# Patient Record
Sex: Male | Born: 1935 | Race: White | Hispanic: No | Marital: Married | Smoking: Never smoker
Health system: Southern US, Community
[De-identification: ages and names within clinical notes are randomized; demographics above are authoritative.]

## PROBLEM LIST (undated history)

## (undated) DIAGNOSIS — L570 Actinic keratosis: Secondary | ICD-10-CM

## (undated) DIAGNOSIS — I714 Abdominal aortic aneurysm, without rupture, unspecified: Secondary | ICD-10-CM

## (undated) DIAGNOSIS — R1310 Dysphagia, unspecified: Secondary | ICD-10-CM

## (undated) DIAGNOSIS — F419 Anxiety disorder, unspecified: Secondary | ICD-10-CM

## (undated) DIAGNOSIS — I639 Cerebral infarction, unspecified: Secondary | ICD-10-CM

## (undated) DIAGNOSIS — E785 Hyperlipidemia, unspecified: Secondary | ICD-10-CM

## (undated) DIAGNOSIS — I4891 Unspecified atrial fibrillation: Secondary | ICD-10-CM

## (undated) DIAGNOSIS — C801 Malignant (primary) neoplasm, unspecified: Secondary | ICD-10-CM

## (undated) DIAGNOSIS — K5790 Diverticulosis of intestine, part unspecified, without perforation or abscess without bleeding: Secondary | ICD-10-CM

## (undated) DIAGNOSIS — Z860100 Personal history of colon polyps, unspecified: Secondary | ICD-10-CM

## (undated) DIAGNOSIS — I499 Cardiac arrhythmia, unspecified: Secondary | ICD-10-CM

## (undated) DIAGNOSIS — I2510 Atherosclerotic heart disease of native coronary artery without angina pectoris: Secondary | ICD-10-CM

## (undated) HISTORY — PX: ODONTOID FRACTURE SURGERY: SHX724

## (undated) HISTORY — DX: Abdominal aortic aneurysm, without rupture: I71.4

## (undated) HISTORY — DX: Personal history of colonic polyps: Z86.010

## (undated) HISTORY — DX: Actinic keratosis: L57.0

## (undated) HISTORY — PX: TONSILLECTOMY: SUR1361

## (undated) HISTORY — DX: Atherosclerotic heart disease of native coronary artery without angina pectoris: I25.10

## (undated) HISTORY — PX: EYE SURGERY: SHX253

## (undated) HISTORY — DX: Abdominal aortic aneurysm, without rupture, unspecified: I71.40

## (undated) HISTORY — DX: Personal history of colon polyps, unspecified: Z86.0100

## (undated) HISTORY — DX: Diverticulosis of intestine, part unspecified, without perforation or abscess without bleeding: K57.90

## (undated) HISTORY — DX: Hyperlipidemia, unspecified: E78.5

## (undated) HISTORY — PX: PROSTATE SURGERY: SHX751

## (undated) HISTORY — DX: Malignant (primary) neoplasm, unspecified: C80.1

---

## 1982-08-23 DIAGNOSIS — I2510 Atherosclerotic heart disease of native coronary artery without angina pectoris: Secondary | ICD-10-CM

## 1982-08-23 HISTORY — PX: CORONARY ARTERY BYPASS GRAFT: SHX141

## 1982-08-23 HISTORY — DX: Atherosclerotic heart disease of native coronary artery without angina pectoris: I25.10

## 1997-08-23 DIAGNOSIS — C801 Malignant (primary) neoplasm, unspecified: Secondary | ICD-10-CM | POA: Insufficient documentation

## 1997-08-23 HISTORY — DX: Malignant (primary) neoplasm, unspecified: C80.1

## 1997-12-13 ENCOUNTER — Encounter: Admission: RE | Admit: 1997-12-13 | Discharge: 1998-03-13 | Payer: Self-pay | Admitting: Radiation Oncology

## 1998-01-29 ENCOUNTER — Ambulatory Visit (HOSPITAL_COMMUNITY): Admission: RE | Admit: 1998-01-29 | Discharge: 1998-01-30 | Payer: Self-pay | Admitting: Urology

## 1998-01-29 HISTORY — PX: OTHER SURGICAL HISTORY: SHX169

## 1998-02-07 ENCOUNTER — Ambulatory Visit (HOSPITAL_COMMUNITY): Admission: RE | Admit: 1998-02-07 | Discharge: 1998-02-07 | Payer: Self-pay | Admitting: Cardiology

## 1999-05-05 ENCOUNTER — Ambulatory Visit (HOSPITAL_COMMUNITY): Admission: RE | Admit: 1999-05-05 | Discharge: 1999-05-05 | Payer: Self-pay | Admitting: Gastroenterology

## 1999-06-24 ENCOUNTER — Ambulatory Visit (HOSPITAL_COMMUNITY): Admission: RE | Admit: 1999-06-24 | Discharge: 1999-06-24 | Payer: Self-pay | Admitting: Gastroenterology

## 2006-05-09 ENCOUNTER — Encounter: Admission: RE | Admit: 2006-05-09 | Discharge: 2006-05-09 | Payer: Self-pay | Admitting: Cardiology

## 2007-01-23 ENCOUNTER — Ambulatory Visit: Payer: Self-pay | Admitting: Family Medicine

## 2007-03-15 ENCOUNTER — Ambulatory Visit: Payer: Self-pay | Admitting: Family Medicine

## 2007-07-25 ENCOUNTER — Ambulatory Visit: Payer: Self-pay | Admitting: Family Medicine

## 2007-11-17 ENCOUNTER — Ambulatory Visit: Payer: Self-pay | Admitting: Family Medicine

## 2007-12-04 ENCOUNTER — Ambulatory Visit: Payer: Self-pay | Admitting: Family Medicine

## 2007-12-11 ENCOUNTER — Ambulatory Visit (HOSPITAL_COMMUNITY): Admission: RE | Admit: 2007-12-11 | Discharge: 2007-12-11 | Payer: Self-pay | Admitting: Urology

## 2008-04-21 ENCOUNTER — Emergency Department (HOSPITAL_BASED_OUTPATIENT_CLINIC_OR_DEPARTMENT_OTHER): Admission: EM | Admit: 2008-04-21 | Discharge: 2008-04-21 | Payer: Self-pay | Admitting: Emergency Medicine

## 2008-04-22 ENCOUNTER — Ambulatory Visit: Payer: Self-pay | Admitting: Family Medicine

## 2009-01-10 ENCOUNTER — Ambulatory Visit: Payer: Self-pay | Admitting: Family Medicine

## 2009-01-24 ENCOUNTER — Ambulatory Visit: Payer: Self-pay | Admitting: Family Medicine

## 2009-03-17 ENCOUNTER — Ambulatory Visit: Payer: Self-pay | Admitting: Family Medicine

## 2009-10-18 ENCOUNTER — Ambulatory Visit: Payer: Self-pay | Admitting: Diagnostic Radiology

## 2009-10-18 ENCOUNTER — Emergency Department (HOSPITAL_BASED_OUTPATIENT_CLINIC_OR_DEPARTMENT_OTHER): Admission: EM | Admit: 2009-10-18 | Discharge: 2009-10-18 | Payer: Self-pay | Admitting: Emergency Medicine

## 2009-10-30 ENCOUNTER — Inpatient Hospital Stay (HOSPITAL_COMMUNITY): Admission: RE | Admit: 2009-10-30 | Discharge: 2009-10-31 | Payer: Self-pay | Admitting: Neurosurgery

## 2009-12-27 ENCOUNTER — Emergency Department (HOSPITAL_BASED_OUTPATIENT_CLINIC_OR_DEPARTMENT_OTHER): Admission: EM | Admit: 2009-12-27 | Discharge: 2009-12-27 | Payer: Self-pay | Admitting: Emergency Medicine

## 2010-02-09 ENCOUNTER — Encounter: Admission: RE | Admit: 2010-02-09 | Discharge: 2010-02-09 | Payer: Self-pay | Admitting: Neurosurgery

## 2010-05-11 ENCOUNTER — Ambulatory Visit (HOSPITAL_COMMUNITY): Admission: RE | Admit: 2010-05-11 | Discharge: 2010-05-11 | Payer: Self-pay | Admitting: Urology

## 2010-07-13 ENCOUNTER — Encounter: Admission: RE | Admit: 2010-07-13 | Discharge: 2010-07-13 | Payer: Self-pay | Admitting: Neurosurgery

## 2010-07-13 ENCOUNTER — Encounter: Admission: RE | Admit: 2010-07-13 | Payer: Self-pay | Admitting: Neurosurgery

## 2010-07-21 ENCOUNTER — Ambulatory Visit: Payer: Self-pay | Admitting: Family Medicine

## 2010-11-05 LAB — CBC
Hemoglobin: 14.1 g/dL (ref 13.0–17.0)
MCH: 32.5 pg (ref 26.0–34.0)
MCHC: 33.7 g/dL (ref 30.0–36.0)
Platelets: 161 10*3/uL (ref 150–400)
RDW: 14.5 % (ref 11.5–15.5)

## 2010-11-05 LAB — BASIC METABOLIC PANEL
BUN: 20 mg/dL (ref 6–23)
CO2: 29 mEq/L (ref 19–32)
Calcium: 9.2 mg/dL (ref 8.4–10.5)
Creatinine, Ser: 1.22 mg/dL (ref 0.4–1.5)
GFR calc non Af Amer: 56 mL/min — ABNORMAL LOW (ref >60)
Glucose, Bld: 99 mg/dL (ref 70–99)
Sodium: 140 mEq/L (ref 135–145)

## 2010-11-05 LAB — PROTIME-INR
INR: 1.07 (ref 0.00–1.49)
INR: 1.38 (ref 0.00–1.49)
Prothrombin Time: 14.1 seconds (ref 11.6–15.2)
Prothrombin Time: 17.2 seconds — ABNORMAL HIGH (ref 11.6–15.2)

## 2010-11-05 LAB — APTT: aPTT: 37 seconds (ref 24–37)

## 2010-11-10 LAB — DIFFERENTIAL
Basophils Absolute: 0.1 10*3/uL (ref 0.0–0.1)
Basophils Relative: 1 % (ref 0–1)
Eosinophils Absolute: 0.3 10*3/uL (ref 0.0–0.7)
Monocytes Absolute: 0.6 10*3/uL (ref 0.1–1.0)
Monocytes Relative: 9 % (ref 3–12)
Neutrophils Relative %: 67 % (ref 43–77)

## 2010-11-10 LAB — CBC
MCHC: 32.5 g/dL (ref 30.0–36.0)
MCV: 96.3 fL (ref 78.0–100.0)
RBC: 4.44 MIL/uL (ref 4.22–5.81)
RDW: 13.8 % (ref 11.5–15.5)

## 2010-11-10 LAB — BASIC METABOLIC PANEL
CO2: 26 mEq/L (ref 19–32)
Calcium: 9.2 mg/dL (ref 8.4–10.5)
Chloride: 105 mEq/L (ref 96–112)
Creatinine, Ser: 1 mg/dL (ref 0.4–1.5)
GFR calc Af Amer: >60 mL/min (ref >60)
Glucose, Bld: 101 mg/dL — ABNORMAL HIGH (ref 70–99)

## 2010-11-10 LAB — PROTIME-INR: INR: 1.06 (ref 0.00–1.49)

## 2010-11-10 LAB — POCT CARDIAC MARKERS: Troponin i, poc: <0.05 ng/mL (ref 0.00–0.09)

## 2010-11-11 LAB — CBC
HCT: 43.4 % (ref 39.0–52.0)
Hemoglobin: 14.4 g/dL (ref 13.0–17.0)
MCV: 97.1 fL (ref 78.0–100.0)
WBC: 6.2 10*3/uL (ref 4.0–10.5)

## 2010-11-11 LAB — DIFFERENTIAL
Eosinophils Relative: 3 % (ref 0–5)
Lymphocytes Relative: 15 % (ref 12–46)
Lymphs Abs: 0.9 10*3/uL (ref 0.7–4.0)
Monocytes Absolute: 0.5 10*3/uL (ref 0.1–1.0)

## 2010-11-11 LAB — BASIC METABOLIC PANEL
CO2: 31 mEq/L (ref 19–32)
Chloride: 104 mEq/L (ref 96–112)
GFR calc Af Amer: >60 mL/min (ref >60)
Potassium: 5.2 mEq/L — ABNORMAL HIGH (ref 3.5–5.1)

## 2010-11-11 LAB — URINALYSIS, ROUTINE W REFLEX MICROSCOPIC
Glucose, UA: NEGATIVE mg/dL
Specific Gravity, Urine: 1.015 (ref 1.005–1.030)
pH: 5.5 (ref 5.0–8.0)

## 2010-11-16 LAB — CBC
HCT: 42.2 % (ref 39.0–52.0)
MCV: 97.8 fL (ref 78.0–100.0)
RBC: 4.32 MIL/uL (ref 4.22–5.81)
WBC: 7.6 10*3/uL (ref 4.0–10.5)

## 2010-11-16 LAB — PROTIME-INR
INR: 1 (ref 0.00–1.49)
Prothrombin Time: 13.7 seconds (ref 11.6–15.2)

## 2010-11-16 LAB — URINALYSIS, ROUTINE W REFLEX MICROSCOPIC
Bilirubin Urine: NEGATIVE
Nitrite: NEGATIVE
Specific Gravity, Urine: 1.019 (ref 1.005–1.030)
Urobilinogen, UA: 0.2 mg/dL (ref 0.0–1.0)

## 2010-11-16 LAB — SURGICAL PCR SCREEN
MRSA, PCR: NEGATIVE
Staphylococcus aureus: NEGATIVE

## 2010-11-16 LAB — BASIC METABOLIC PANEL
Chloride: 105 mEq/L (ref 96–112)
Creatinine, Ser: 1.01 mg/dL (ref 0.4–1.5)
GFR calc Af Amer: >60 mL/min (ref >60)
Potassium: 4.1 mEq/L (ref 3.5–5.1)
Sodium: 138 mEq/L (ref 135–145)

## 2011-01-05 NOTE — Op Note (Signed)
Gary Valencia, Gary Valencia             ACCOUNT NO.:  [id number]   MEDICAL RECORD NO.:  [id number]          PATIENT TYPE:  AMB   LOCATION:  DAY                          FACILITY:  [facility]   PHYSICIAN:  Valetta Fuller, M.D.  DATE OF BIRTH:  10-10-1922   DATE OF PROCEDURE:  12/11/2007  DATE OF DISCHARGE:                               OPERATIVE REPORT   PREOPERATIVE DIAGNOSIS:  Bulbar urethral stricture.   POSTOPERATIVE DIAGNOSIS:  Bulbar urethral stricture.   PROCEDURE PERFORMED:  Cystoscopy with balloon dilation of stricture and  placement of Foley catheter.   SURGEON:  Valetta Fuller, M.D.   ANESTHESIA:  General.   INDICATIONS:  Mr. Sesay is an 77 year old male.  The patient was  diagnosed and treated for adenocarcinoma of the prostate about 10 years  ago.  The patient had seed implantation and his PSA has essentially been  at zero.  He has had previous problems with bulbar urethral stricture  disease which was probably iatrogenic from previous instrumentation  along with radiation.  The patient has had some endoscopic procedures in  the past.  He was doing quite well a few months ago but has noticed  increasing voiding difficulties.  Recent cystoscopy revealed a very  tight but what appeared to be short bulbar urethral stricture.  We  talked about a repeat attempt at dilation which he presents for today.   TECHNIQUE ADVISED:  The patient was brought to the operating room where  he had successful induction of general anesthesia.  Placed in lithotomy  position, prepped and draped in the usual manner.  Cystoscopy again  revealed what appeared to be a fairly tight bulbar urethral stricture.  A guidewire was placed through this area and confirmed to be in the  bladder with fluoroscopy.  We then used a nephrostomy dilating balloon  of 24-French.  This was dilated to 15 atmospheres and held in place for  approximately 7-8 minutes.  The balloon was then deflated.  The dilation  appeared to be relatively atraumatic and easily allowed [place] to visualize  with the cystoscope.  The bladder itself showed no other pathology.  Over the guidewire we placed in 18-French Councill Foley catheter and  urine drained clear.  The patient was brought to the recovery room in  stable condition.           ______________________________  Valetta Fuller, M.D.  Electronically Signed     DSG/MEDQ  D:  12/11/2007  T:  12/11/2007  Job:  [id number]

## 2011-05-18 LAB — BASIC METABOLIC PANEL
BUN: 14
CO2: 30
Chloride: 103
Glucose, Bld: 105 — ABNORMAL HIGH
Potassium: 4.5

## 2011-06-23 ENCOUNTER — Encounter: Payer: Self-pay | Admitting: Family Medicine

## 2011-10-09 ENCOUNTER — Emergency Department (HOSPITAL_COMMUNITY)
Admission: EM | Admit: 2011-10-09 | Discharge: 2011-10-09 | Disposition: A | Discharge status: Home / Self Care | Payer: PRIVATE HEALTH INSURANCE | Attending: Emergency Medicine | Admitting: Emergency Medicine

## 2011-10-09 ENCOUNTER — Encounter (HOSPITAL_COMMUNITY): Payer: Self-pay | Admitting: Emergency Medicine

## 2011-10-09 ENCOUNTER — Other Ambulatory Visit: Payer: Self-pay

## 2011-10-09 DIAGNOSIS — I4891 Unspecified atrial fibrillation: Secondary | ICD-10-CM | POA: Insufficient documentation

## 2011-10-09 DIAGNOSIS — Z79899 Other long term (current) drug therapy: Secondary | ICD-10-CM | POA: Insufficient documentation

## 2011-10-09 DIAGNOSIS — E785 Hyperlipidemia, unspecified: Secondary | ICD-10-CM | POA: Insufficient documentation

## 2011-10-09 DIAGNOSIS — R5381 Other malaise: Secondary | ICD-10-CM | POA: Insufficient documentation

## 2011-10-09 DIAGNOSIS — Z8546 Personal history of malignant neoplasm of prostate: Secondary | ICD-10-CM | POA: Insufficient documentation

## 2011-10-09 DIAGNOSIS — I2510 Atherosclerotic heart disease of native coronary artery without angina pectoris: Secondary | ICD-10-CM | POA: Insufficient documentation

## 2011-10-09 DIAGNOSIS — Z7901 Long term (current) use of anticoagulants: Secondary | ICD-10-CM | POA: Insufficient documentation

## 2011-10-09 LAB — BASIC METABOLIC PANEL
CO2: 26 mEq/L (ref 19–32)
Calcium: 9.3 mg/dL (ref 8.4–10.5)
Creatinine, Ser: 1.2 mg/dL (ref 0.50–1.35)
Glucose, Bld: 104 mg/dL — ABNORMAL HIGH (ref 70–99)

## 2011-10-09 LAB — GLUCOSE, CAPILLARY: Glucose-Capillary: 94 mg/dL (ref 70–99)

## 2011-10-09 LAB — DIFFERENTIAL
Eosinophils Relative: 5 % (ref 0–5)
Lymphocytes Relative: 26 % (ref 12–46)
Lymphs Abs: 1.7 10*3/uL (ref 0.7–4.0)
Monocytes Absolute: 0.6 10*3/uL (ref 0.1–1.0)

## 2011-10-09 LAB — CBC
HCT: 40.4 % (ref 39.0–52.0)
Hemoglobin: 13.3 g/dL (ref 13.0–17.0)
MCV: 94.4 fL (ref 78.0–100.0)
RBC: 4.28 MIL/uL (ref 4.22–5.81)
WBC: 6.6 10*3/uL (ref 4.0–10.5)

## 2011-10-09 LAB — PROTIME-INR
INR: 2.54 — ABNORMAL HIGH (ref 0.00–1.49)
Prothrombin Time: 27.8 seconds — ABNORMAL HIGH (ref 11.6–15.2)

## 2011-10-09 MED ORDER — SODIUM CHLORIDE 0.9 % IV BOLUS (SEPSIS)
500.0000 mL | Freq: Once | INTRAVENOUS | Status: AC
  Administered 2011-10-09: 08:00:00 via INTRAVENOUS

## 2011-10-09 NOTE — ED Provider Notes (Signed)
This 78 year old male has a history of chronic atrial fibrillation and switched from his research anticoagulant a few weeks ago back to Coumadin. He has had about a month of generalized weakness with occasional palpitations without lightheadedness, syncope, chest pain, shortness of breath, edema, or lateralizing weakness or numbness. He has an appointment in 2 days with his primary care doctor to follow up on his weakness. He has had several days of one to 2 nonbloody somewhat loose stools per day without fever or abdominal pain or bloody stools. His lungs are currently clear to auscultation.  ECG: Atrial fibrillation, ventricular rate 84, left axis deviation, no acute ischemic changes noted, compared with May 2011 left axis deviation is new otherwise no significant changes noted  Gary Horn, MD 10/09/11 2154

## 2011-10-09 NOTE — ED Notes (Signed)
EKG DONE BY EMT R Deivi Huckins, NEW AND OLD EKG GIVEN TO ER DR

## 2011-10-09 NOTE — ED Provider Notes (Signed)
Medical screening examination/treatment/procedure(s) were conducted as a shared visit with non-physician practitioner(s) and myself.  I personally evaluated the patient during the encounter  Gary Horn, MD 10/09/11 2154

## 2011-10-09 NOTE — ED Notes (Signed)
Report given to [name], [place]. Pt moved to CDU 2.

## 2011-10-09 NOTE — Discharge Instructions (Signed)
Gary Valencia keep your  appointment with Dr. Susann Givens on Monday.  EKG today were normal. The INR today was 2.54.  Your heart rate Today was in the 70s and 80s. Return for SOB breath, chest pain or any other concerns.   Atrial Fibrillation Atrial fibrillation is an abnormal heartbeat (rhythm). It can cause your heart rate to be faster or slower than normal, and can cause clots of blood to form in your heart. These clots can cause other health problems. Atrial fibrillation may be caused by a heart attack, lung problem, or certain medicine. Sometimes the cause of atrial fibrillation is not found. HOME CARE  Take blood thinning medicine (anticoagulants) as told by your doctor. Your doctor will need to draw your blood to check lab values if you take blood thinners.   If you had a cardioversion, limit your activity as told by your doctor.   Learn how to check your heartbeat (pulse) for an abnormal or irregular beat. Your doctor can show you how.   Ask your doctor if it is okay to exercise.   Only take medicine as told by your doctor.  GET HELP RIGHT AWAY IF:   You have trouble breathing or feel dizzy.   You have puffy (swollen) feet or ankles.   You have blood in your pee (urine) or poop (bowel movement).   You feel your heart "skipping" beats.   You feel your heart "racing" or beating fast.   You have weakness in your arms or legs.   You have trouble talking, seeing, or thinking.   You have chest pain or pain in your arm or jaw.  MAKE SURE YOU:   Understand these instructions.   Will watch your condition.   Will get help right away if you are not doing well or get worse.  Document Released: 05/18/2008 Document Revised: 04/21/2011 Document Reviewed: 11/27/2009 [facility] Patient Information 2012 [place], [place].Atrial Fibrillation Atrial fibrillation is an abnormal heartbeat (rhythm). It can cause your heart rate to be faster or slower than normal, and can cause clots of blood to  form in your heart. These clots can cause other health problems. Atrial fibrillation may be caused by a heart attack, lung problem, or certain medicine. Sometimes the cause of atrial fibrillation is not found. HOME CARE  Take blood thinning medicine (anticoagulants) as told by your doctor. Your doctor will need to draw your blood to check lab values if you take blood thinners.   If you had a cardioversion, limit your activity as told by your doctor.   Learn how to check your heartbeat (pulse) for an abnormal or irregular beat. Your doctor can show you how.   Ask your doctor if it is okay to exercise.   Only take medicine as told by your doctor.  GET HELP RIGHT AWAY IF:   You have trouble breathing or feel dizzy.   You have puffy (swollen) feet or ankles.   You have blood in your pee (urine) or poop (bowel movement).   You feel your heart "skipping" beats.   You feel your heart "racing" or beating fast.   You have weakness in your arms or legs.   You have trouble talking, seeing, or thinking.   You have chest pain or pain in your arm or jaw.  MAKE SURE YOU:   Understand these instructions.   Will watch your condition.   Will get help right away if you are not doing well or get worse.  Document Released: 05/18/2008  Document Revised: 04/21/2011 Document Reviewed: 11/27/2009 [facility] Patient Information 2012 [place], [place].

## 2011-10-09 NOTE — ED Provider Notes (Signed)
History     CSN: [id number]  Arrival date & time 10/09/11  0707   First MD Initiated Contact with Patient 10/09/11 [phone]      Chief Complaint  Patient presents with  . Irregular Heart Beat    (Consider location/radiation/quality/duration/timing/severity/associated sxs/prior treatment) Patient is a 78 y.o. male presenting with weakness. The history is provided by the patient. No language interpreter was used.  Weakness Primary symptoms do not include syncope, loss of consciousness, altered mental status, dizziness, focal weakness, fever, nausea or vomiting. The symptoms began 5 to 7 days ago.  Additional symptoms include weakness. Additional symptoms do not include pain, lower back pain, leg pain, vertigo or anxiety.  78 yo  male here today complaining of heart palpitations and weakness for 5 days. States that he went to Dr. Clarene Duke this week for the same complaint. Patient had a CABG in the past. He is in atrial for chronically and is on Coumadin. States that he was on experimental drug but they quit that and restarted his Coumadin 3 weeks ago. Denies shortness of breath or chest pain today. Heart rate presently 82 and his heart rate was 78 upon arrival EMS. Patient does look dehydrated and states that he has had diarrhea for 5 days twice a day. His wife. States that he got up this morning and went to the bathroom. When he came back to bed he was lying there and felt the palpitations come on.  Past Medical History  Diagnosis Date  . ASHD (arteriosclerotic heart disease) 1984  . Cancer 1999    PROSTATE  . Personal history of colonic polyps   . Dyslipidemia   . AAA (abdominal aortic aneurysm)   . Diverticulosis   . Actinic keratoses     History reviewed. No pertinent past surgical history.  Family History  Problem Relation Age of Onset  . Arthritis Mother   . Heart disease Father     History  Substance Use Topics  . Smoking status: Not on file  . Smokeless tobacco: Not on file   . Alcohol Use:       Review of Systems  Constitutional: Negative for fever.  Cardiovascular: Negative for syncope.  Gastrointestinal: Negative for nausea and vomiting.  Neurological: Positive for weakness. Negative for dizziness, vertigo, focal weakness and loss of consciousness.  Psychiatric/Behavioral: Negative for altered mental status.  All other systems reviewed and are negative.    Allergies  Asa buff (mag and Zetia  Home Medications   Current Outpatient Rx  Name Route Sig Dispense Refill  . ATORVASTATIN CALCIUM 40 MG PO TABS Oral Take 40 mg by mouth daily.      Marland Kitchen BENAZEPRIL HCL 10 MG PO TABS Oral Take 5 mg by mouth.      . COD LIVER OIL 1250-135 UNITS PO CAPS Oral Take by mouth.      . METOPROLOL SUCCINATE ER 50 MG PO TB24 Oral Take 50 mg by mouth daily.      . MULTI-VITAMIN/MINERALS PO TABS Oral Take 1 tablet by mouth daily.      . WARFARIN SODIUM 5 MG PO TABS Oral Take 5 mg by mouth daily.        BP 180/100  Pulse 90  Resp 18  Physical Exam  Nursing note and vitals reviewed. Constitutional: He is oriented to person, place, and time. He appears well-developed and well-nourished.  HENT:  Head: Normocephalic and atraumatic.  Eyes: Pupils are equal, round, and reactive to light.  Neck: Neck supple.  Cardiovascular: Normal rate and regular rhythm.  Exam reveals no gallop and no friction rub.   No murmur heard. Pulmonary/Chest: Effort normal and breath sounds normal. No respiratory distress.  Abdominal: Soft. He exhibits no distension.  Musculoskeletal: Normal range of motion.  Neurological: He is alert and oriented to person, place, and time. No cranial nerve deficit.  Skin: Skin is warm and dry.  Psychiatric: He has a normal mood and affect.    ED Course  Procedures (including critical care time)   Labs Reviewed  CBC  DIFFERENTIAL  BASIC METABOLIC PANEL  PROTIME-INR   No results found.   No diagnosis found.    MDM  78 year old male here  today with complaint of heart palpitations and weakness times several days. PMH of atrial fib  With a controlled rate. He is on Coumadin. INR is 2.5 for all other labs are normal today. Had diarrhea for several day.  Bolus in ER and encouraged to drink plenty of fluids.  Will disposition home and he already has an appointment with Dr. Susann Givens on Monday.  Return for SOB or chest pain.         Jethro Bastos, NP 10/09/11 [phone]

## 2011-10-09 NOTE — ED Notes (Signed)
Pt presents to department for evaluation of palpitations. Onset this morning. Pt states "my heart was racing." also states generalized weakness and fatigue for several weeks. Respirations unlabored. Lung sounds clear and equal bilaterally. Skin warm and dry. Denies chest pain at the time. Pt states he was recently taken off trial afib medication and placed on coumadin, states he began feeling weak shortly after. He is conscious alert and oriented x4. No signs of distress noted at the time.

## 2011-10-09 NOTE — ED Notes (Signed)
Patient was taken off a A-fib trial medication 3 weeks ago and put on coumadin and since has been feeling weak.  Patient states this morning woke up with a funny feeling in chest not painful but feels like heart skipping around. #20 LFA  EKG A-fib (78)

## 2011-10-11 ENCOUNTER — Ambulatory Visit (INDEPENDENT_AMBULATORY_CARE_PROVIDER_SITE_OTHER): Payer: 59 | Admitting: Family Medicine

## 2011-10-11 ENCOUNTER — Encounter: Payer: Self-pay | Admitting: Family Medicine

## 2011-10-11 VITALS — BP 150/86 | HR 136 | Wt 154.0 lb

## 2011-10-11 DIAGNOSIS — I714 Abdominal aortic aneurysm, without rupture, unspecified: Secondary | ICD-10-CM

## 2011-10-11 DIAGNOSIS — I2510 Atherosclerotic heart disease of native coronary artery without angina pectoris: Secondary | ICD-10-CM | POA: Insufficient documentation

## 2011-10-11 DIAGNOSIS — E785 Hyperlipidemia, unspecified: Secondary | ICD-10-CM | POA: Insufficient documentation

## 2011-10-11 DIAGNOSIS — Z8546 Personal history of malignant neoplasm of prostate: Secondary | ICD-10-CM

## 2011-10-11 DIAGNOSIS — I482 Chronic atrial fibrillation, unspecified: Secondary | ICD-10-CM

## 2011-10-11 DIAGNOSIS — Z79899 Other long term (current) drug therapy: Secondary | ICD-10-CM

## 2011-10-11 DIAGNOSIS — I4891 Unspecified atrial fibrillation: Secondary | ICD-10-CM

## 2011-10-11 LAB — COMPREHENSIVE METABOLIC PANEL
ALT: 15 U/L (ref 0–53)
AST: 26 U/L (ref 0–37)
Alkaline Phosphatase: 86 U/L (ref 39–117)
CO2: 26 mEq/L (ref 19–32)
Sodium: 138 mEq/L (ref 135–145)
Total Bilirubin: 0.5 mg/dL (ref 0.3–1.2)
Total Protein: 7.3 g/dL (ref 6.0–8.3)

## 2011-10-11 LAB — LIPID PANEL
LDL Cholesterol: 178 mg/dL — ABNORMAL HIGH (ref 0–99)
VLDL: 26 mg/dL (ref 0–40)

## 2011-10-11 NOTE — Patient Instructions (Signed)
Continuing your present medications. 

## 2011-10-11 NOTE — Progress Notes (Signed)
  Subjective:    Patient ID: Gary Valencia, male    DOB: 1922/08/26, 78 y.o.   MRN: [id number]  HPI He is here for recheck. He was seen yesterday in the emergency room and evaluated for weakness. The emergency room record was reviewed. It was essentially negative and they recommended increasing his fluids. Presently he is having no difficulty with chest pain, shortness of breath, irregular heart rate, nausea or vomiting. He recently has been involved in a research protocol regarding his atrial fibrillation and an experimental medication. Presently he is back on his Coumadin. His PT/INR in the emergency room was 2.5. He has a history of statin intolerance. He has not started on simvastatin yet. He also has AAA is being followed by his cardiologist.   Review of Systems     Objective:   Physical Exam alert and in no distress. Tympanic membranes and canals are normal. Throat is clear. Tonsils are normal. Neck is supple without adenopathy or thyromegaly. Cardiac exam shows a regular sinus rhythm without murmurs or gallops. Lungs are clear to auscultation.        Assessment & Plan:   1. Atrial fibrillation, chronic    2. ASHD (arteriosclerotic heart disease)  CBC with Differential, Comprehensive metabolic panel, Lipid panel  3. History of prostate cancer    4. AAA (abdominal aortic aneurysm)    5. Hyperlipidemia LDL goal <70  Lipid panel  6. Encounter for long-term (current) use of other medications  CBC with Differential, Comprehensive metabolic panel, Lipid panel   routine blood screening as above. He will continue his present Coumadin dosing.

## 2011-10-12 LAB — CBC WITH DIFFERENTIAL/PLATELET
Eosinophils Absolute: 0.1 10*3/uL (ref 0.0–0.7)
HCT: 45.5 % (ref 39.0–52.0)
Hemoglobin: 14.4 g/dL (ref 13.0–17.0)
Lymphs Abs: 0.8 10*3/uL (ref 0.7–4.0)
MCHC: 31.6 g/dL (ref 30.0–36.0)
MCV: 96.8 fL (ref 78.0–100.0)
Neutrophils Relative %: 79 % — ABNORMAL HIGH (ref 43–77)
Platelets: 159 10*3/uL (ref 150–400)
RDW: 14.3 % (ref 11.5–15.5)

## 2011-12-10 ENCOUNTER — Other Ambulatory Visit: Payer: PRIVATE HEALTH INSURANCE

## 2011-12-10 DIAGNOSIS — E785 Hyperlipidemia, unspecified: Secondary | ICD-10-CM

## 2011-12-10 LAB — LIPID PANEL
Cholesterol: 175 mg/dL (ref 0–200)
HDL: 56 mg/dL (ref >39)
Triglycerides: 82 mg/dL (ref <150)

## 2012-07-06 ENCOUNTER — Other Ambulatory Visit (HOSPITAL_COMMUNITY): Payer: Self-pay | Admitting: Cardiovascular Disease

## 2012-07-06 DIAGNOSIS — I714 Abdominal aortic aneurysm, without rupture: Secondary | ICD-10-CM

## 2012-07-13 ENCOUNTER — Encounter (HOSPITAL_COMMUNITY): Payer: PRIVATE HEALTH INSURANCE

## 2012-07-17 ENCOUNTER — Ambulatory Visit (HOSPITAL_COMMUNITY)
Admission: RE | Admit: 2012-07-17 | Discharge: 2012-07-17 | Disposition: A | Discharge status: Home / Self Care | Payer: PRIVATE HEALTH INSURANCE | Source: Ambulatory Visit | Attending: Cardiovascular Disease | Admitting: Cardiovascular Disease

## 2012-07-17 DIAGNOSIS — I714 Abdominal aortic aneurysm, without rupture, unspecified: Secondary | ICD-10-CM | POA: Insufficient documentation

## 2012-07-17 NOTE — Progress Notes (Signed)
Abdominal Aortic Duplex Sonogram Completed. Sibyl Parr 07/17/2012

## 2012-07-18 NOTE — Progress Notes (Signed)
Aorta completed. Marzetta Merino

## 2012-09-13 ENCOUNTER — Emergency Department (HOSPITAL_COMMUNITY)
Admission: EM | Admit: 2012-09-13 | Discharge: 2012-09-13 | Disposition: A | Discharge status: Home / Self Care | Payer: PRIVATE HEALTH INSURANCE | Attending: Emergency Medicine | Admitting: Emergency Medicine

## 2012-09-13 ENCOUNTER — Emergency Department (HOSPITAL_COMMUNITY): Payer: PRIVATE HEALTH INSURANCE

## 2012-09-13 ENCOUNTER — Encounter (HOSPITAL_COMMUNITY): Payer: Self-pay | Admitting: *Deleted

## 2012-09-13 DIAGNOSIS — Z8679 Personal history of other diseases of the circulatory system: Secondary | ICD-10-CM | POA: Insufficient documentation

## 2012-09-13 DIAGNOSIS — I2510 Atherosclerotic heart disease of native coronary artery without angina pectoris: Secondary | ICD-10-CM

## 2012-09-13 DIAGNOSIS — R0789 Other chest pain: Secondary | ICD-10-CM | POA: Insufficient documentation

## 2012-09-13 DIAGNOSIS — Z8719 Personal history of other diseases of the digestive system: Secondary | ICD-10-CM | POA: Insufficient documentation

## 2012-09-13 DIAGNOSIS — I482 Chronic atrial fibrillation, unspecified: Secondary | ICD-10-CM | POA: Diagnosis present

## 2012-09-13 DIAGNOSIS — Z8546 Personal history of malignant neoplasm of prostate: Secondary | ICD-10-CM

## 2012-09-13 DIAGNOSIS — E785 Hyperlipidemia, unspecified: Secondary | ICD-10-CM | POA: Diagnosis present

## 2012-09-13 DIAGNOSIS — R079 Chest pain, unspecified: Secondary | ICD-10-CM

## 2012-09-13 DIAGNOSIS — Z872 Personal history of diseases of the skin and subcutaneous tissue: Secondary | ICD-10-CM | POA: Insufficient documentation

## 2012-09-13 DIAGNOSIS — Z7901 Long term (current) use of anticoagulants: Secondary | ICD-10-CM

## 2012-09-13 DIAGNOSIS — Z860100 Personal history of colon polyps, unspecified: Secondary | ICD-10-CM | POA: Insufficient documentation

## 2012-09-13 DIAGNOSIS — R002 Palpitations: Secondary | ICD-10-CM

## 2012-09-13 DIAGNOSIS — I10 Essential (primary) hypertension: Secondary | ICD-10-CM

## 2012-09-13 DIAGNOSIS — I714 Abdominal aortic aneurysm, without rupture, unspecified: Secondary | ICD-10-CM | POA: Diagnosis present

## 2012-09-13 HISTORY — DX: Anxiety disorder, unspecified: F41.9

## 2012-09-13 LAB — PROTIME-INR
INR: 3.02 — ABNORMAL HIGH (ref 0.00–1.49)
Prothrombin Time: 29.7 seconds — ABNORMAL HIGH (ref 11.6–15.2)

## 2012-09-13 LAB — CBC
HCT: 39.7 % (ref 39.0–52.0)
MCHC: 33.2 g/dL (ref 30.0–36.0)
MCV: 94.5 fL (ref 78.0–100.0)
RDW: 14.6 % (ref 11.5–15.5)

## 2012-09-13 LAB — COMPREHENSIVE METABOLIC PANEL
Albumin: 3.2 g/dL — ABNORMAL LOW (ref 3.5–5.2)
BUN: 26 mg/dL — ABNORMAL HIGH (ref 6–23)
Creatinine, Ser: 1.26 mg/dL (ref 0.50–1.35)
GFR calc Af Amer: 57 mL/min — ABNORMAL LOW (ref >90)
Total Bilirubin: 0.3 mg/dL (ref 0.3–1.2)
Total Protein: 7.4 g/dL (ref 6.0–8.3)

## 2012-09-13 LAB — POCT I-STAT TROPONIN I

## 2012-09-13 MED ORDER — ALPRAZOLAM 0.25 MG PO TABS
0.2500 mg | ORAL_TABLET | ORAL | Status: DC | PRN
  Administered 2012-09-13: 0.25 mg via ORAL
  Filled 2012-09-13: qty 1

## 2012-09-13 NOTE — Consult Note (Signed)
Reason for Consult: Palpitations Referring Physician: ED Department   HPI: Gary Valencia is an 79 y.o. male with permanent A-fib (rate controlled) and is on chronic anticoagulation therapy with Warfarin. His history also is significant for CAD (s/p CABG in 1984), AAA (4.09 cm on last [place]), HTN and HLD. He is a former patient of Dr. Clarene Duke, but his now being followed by Dr. Royann Shivers. His last echo was in 2006 and revealed normal LV function and no WMA. His last NST was in July 2012, which was low-risk. His estimated EF at that time was 65%.  He presented to the ED today with a complaint of palpitations. He first noticed a racing heart rate this morning around 7:00 am while getting out of bed. He reports that he has been fairly asymptomatic with his a-fib. The episode lasted less than 30 minutes. He also had a "funny discomfort" in his chest but denies chest pain/tightness/pressure. The discomfort was non-radiating. He denies SOB, lightheadedness/dizziness, presyncope/syncope, diaphoresis, n/v, fevers, chills, HA, hematochezia and hematuria. He reports that he felt better after taking his medications. He denies any episodes since this morning. He self reports that he often has anxiety. He was prescribed prn anxiety medication by Dr. Clarene Duke in the past, but never uses them due to undesired side effects.   Past Medical History  Diagnosis Date  . ASHD (arteriosclerotic heart disease) 1984  . Cancer 1999    PROSTATE  . Personal history of colonic polyps   . Dyslipidemia   . AAA (abdominal aortic aneurysm)   . Diverticulosis   . Actinic keratoses   . Anxiety     History reviewed. No pertinent past surgical history.  Family History  Problem Relation Age of Onset  . Arthritis Mother   . Heart disease Father     Social History:  reports that he has never smoked. He has never used smokeless tobacco. He reports that he does not drink alcohol or use illicit drugs.  Allergies:  Allergies    Allergen Reactions  . Asa Buff (Mag (Buffered Aspirin)   . Zetia (Ezetimibe)     Medications: I have reviewed the patient's current medications.  Results for orders placed during the hospital encounter of 09/13/12 (from the past 48 hour(s))  CBC     Status: Abnormal   Collection Time   09/13/12  9:48 AM      Component Value Range Comment   WBC 6.5  4.0 - 10.5 K/uL    RBC 4.20 (*) 4.22 - 5.81 MIL/uL    Hemoglobin 13.2  13.0 - 17.0 g/dL    HCT 16.1  09.6 - 04.5 %    MCV 94.5  78.0 - 100.0 fL    MCH 31.4  26.0 - 34.0 pg    MCHC 33.2  30.0 - 36.0 g/dL    RDW 40.9  81.1 - 91.4 %    Platelets 146 (*) 150 - 400 K/uL   COMPREHENSIVE METABOLIC PANEL     Status: Abnormal   Collection Time   09/13/12  9:48 AM      Component Value Range Comment   Sodium 136  135 - 145 mEq/L    Potassium 4.8  3.5 - 5.1 mEq/L    Chloride 102  96 - 112 mEq/L    CO2 22  19 - 32 mEq/L    Glucose, Bld 104 (*) 70 - 99 mg/dL    BUN 26 (*) 6 - 23 mg/dL    Creatinine, Ser 7.82  0.50 - 1.35 mg/dL    Calcium 9.3  8.4 - 16.1 mg/dL    Total Protein 7.4  6.0 - 8.3 g/dL    Albumin 3.2 (*) 3.5 - 5.2 g/dL    AST 27  0 - 37 U/L    ALT 15  0 - 53 U/L    Alkaline Phosphatase 88  39 - 117 U/L    Total Bilirubin 0.3  0.3 - 1.2 mg/dL    GFR calc non Af Amer 49 (*) >90 mL/min    GFR calc Af Amer 57 (*) >90 mL/min   PROTIME-INR     Status: Abnormal   Collection Time   09/13/12  9:48 AM      Component Value Range Comment   Prothrombin Time 29.7 (*) 11.6 - 15.2 seconds    INR 3.02 (*) 0.00 - 1.49   POCT I-STAT TROPONIN I     Status: Normal   Collection Time   09/13/12 10:08 AM      Component Value Range Comment   Troponin i, poc 0.00  0.00 - 0.08 ng/mL    Comment 3              Dg Chest 2 View  09/13/2012  *RADIOLOGY REPORT*  Clinical Data: Chest pain.  CHEST - 2 VIEW  Comparison: 10/27/2009.  Findings: The heart is borderline in size but stable.  Stable surgical changes from bypass surgery.  There are chronic  bronchitic type lung changes but no acute pulmonary findings or pulmonary mass lesion.  No pleural effusion.  The bony thorax is intact.  There is a stable compression fracture of T12.  IMPRESSION: Chronic lung changes but no acute pulmonary findings.   Original Report Authenticated By: Rudie Meyer, M.D.     Review of Systems  Constitutional: Negative for fever, chills, malaise/fatigue and diaphoresis.  HENT: Negative for nosebleeds.   Respiratory: Negative for cough, hemoptysis and shortness of breath.   Cardiovascular: Positive for palpitations. Negative for chest pain and leg swelling.  Gastrointestinal: Negative for nausea, vomiting, abdominal pain, blood in stool and melena.  Genitourinary: Negative for hematuria.  Neurological: Negative for dizziness, loss of consciousness and weakness.   Blood pressure 150/86, pulse 67, temperature 97.9 F (36.6 C), temperature source Oral, resp. rate 16, height 5\' 7"  (1.702 m), weight 145 lb (65.772 kg), SpO2 100.00%. Physical Exam  Constitutional: He is oriented to person, place, and time. He appears well-developed and well-nourished. No distress.  HENT:  Head: Normocephalic and atraumatic.  Eyes: Conjunctivae normal are normal. Pupils are equal, round, and reactive to light.  Neck: Normal range of motion. No JVD present. Carotid bruit is not present. No thyromegaly present.  Cardiovascular: Intact distal pulses.  An irregularly irregular rhythm present. Exam reveals no gallop, no friction rub and no decreased pulses.   No murmur heard. Pulses:      Radial pulses are 2+ on the right side, and 2+ on the left side.       Posterior tibial pulses are 1+ on the right side, and 1+ on the left side.  Respiratory: No respiratory distress. He has no wheezes. He has no rales.  GI: Soft. Bowel sounds are normal. He exhibits no distension. There is no tenderness.  Musculoskeletal: He exhibits no edema.  Lymphadenopathy:    He has no cervical adenopathy.    Neurological: He is alert and oriented to person, place, and time.  Skin: Skin is warm and dry. He is not diaphoretic.  Psychiatric:  He has a normal mood and affect. His behavior is normal.    Assessment/Plan: Patient Active Hospital Problem List: Palpitations (09/13/2012) Atrial fibrillation (rate controlled) (10/11/2011) History of prostate cancer (10/11/2011) AAA (4.09 cm on last [place] -2013) (10/11/2011) Hyperlipidemia LDL goal <70 (10/11/2011) CAD - S/P CABG in 1984 (09/13/2012) HTN (hypertension) (09/13/2012) Chronic anticoagulation -Warfarin (09/13/2012)   Plan: Pt has permanent a-fib. His rate is controlled, with rates in the 70's. BP is mildly elevated, with SBPs in the 140s/150s. He is currently asymptomatic. EKG shows a-fib with no acute changes. POC Troponin I was negative. H/H are WNL. INR is therapeutic at 3.02. Exam is benign. Can likely discharge from ED. MD to assess and provide further recommendation.   Allayne Butcher, PA-C 09/13/2012, 1:55 PM

## 2012-09-13 NOTE — ED Notes (Signed)
MD at bedside. 

## 2012-09-13 NOTE — ED Notes (Signed)
Pt awoke this am and had an episode when he felt his heart was beating fast and pounding in his chest.  Denies any respiratory distress, nausea, or diaphoresis during episode.  Pt called 911 and upon [facility] arrival pt. denies any CP, fast heart rate.  Pt only complains of feeling anxious.  Upon arrival at [facility] pt denies any problems at present and feels "normal".  States he's always had problems with anxiety.

## 2012-09-13 NOTE — Consult Note (Signed)
Pt. Seen and examined. Agree with the NP/PA-C note as written.  79 yo male with permanent a-fib, CAD s/p CABG, AAA on warfarin.  Presents with acute onset palpitations when awakening this morning. He did not check HR or BP, however, felt better when EMS arrived - shortly after taking medications. On arrival to the ER, HR was controlled afib in the 70's. No ischemic EKG changes. BP mildly elevated.  He is somewhat anxious, otherwise, exam is benign. CXR shows chronic bronchitic changes, but no acute findings. Labs WNL - INR 3.02.  Troponin POC was zero.    Impression: 1. Probable subjective palpitations due to anxiety 2. Permanent a-fib, rate-controlled 3. Therapeutic on warfarin  Plan: 1.  No clear cardiac indication of ongoing palpitations. Suspect anxiety.  Ok for discharge home. Will add low dose xanax 0.25 mg q6hr prn.  He reports lorazepam makes him too sleepy. If he has recurrent symptoms, could arrange a Holter monitor in our office. Just saw Dr. Salena Saner in the office. Will not need follow-up unless he has recurrent symptoms.  Thanks for the consult.  Chrystie Nose, MD, [facility] Attending Cardiologist [facility]

## 2012-09-13 NOTE — ED Provider Notes (Signed)
History     CSN: [id number]  Arrival date & time 09/13/12  8295   First MD Initiated Contact with Patient 09/13/12 0930      Chief Complaint  Patient presents with  . Anxiety    (Consider location/radiation/quality/duration/timing/severity/associated sxs/prior treatment) HPI The patient presents with concerns of chest pain, now resolved.  He states it is a long history of atrial fibrillation, intermittent chest pain.  Today, he was awakened from sleep, just prior to arrival with different anterior chest pressure.  There is associated palpitations, generalized discomfort, anxiety.  Symptoms lasted until just prior to arrival, resolved without clear intervention.  He currently has no pain, no focal complaints. He states that he has been in his usual state of health, as directed he has been taking all medications  Past Medical History  Diagnosis Date  . ASHD (arteriosclerotic heart disease) 1984  . Cancer 1999    PROSTATE  . Personal history of colonic polyps   . Dyslipidemia   . AAA (abdominal aortic aneurysm)   . Diverticulosis   . Actinic keratoses   . Anxiety     History reviewed. No pertinent past surgical history.  Family History  Problem Relation Age of Onset  . Arthritis Mother   . Heart disease Father     History  Substance Use Topics  . Smoking status: Never Smoker   . Smokeless tobacco: Never Used  . Alcohol Use: No      Review of Systems  Constitutional:       Per HPI, otherwise negative  HENT:       Per HPI, otherwise negative  Eyes: Negative.   Respiratory:       Per HPI, otherwise negative  Cardiovascular:       Per HPI, otherwise negative  Gastrointestinal: Negative for vomiting.  Genitourinary: Negative.   Musculoskeletal:       Per HPI, otherwise negative  Skin: Negative.   Neurological: Negative for syncope.    Allergies  Asa buff (mag and Zetia  Home Medications   Current Outpatient Rx  Name  Route  Sig  Dispense  Refill  .  BENAZEPRIL HCL 10 MG PO TABS   Oral   Take 10 mg by mouth daily.          . COD LIVER OIL 1250-135 UNITS PO CAPS   Oral   Take 1 capsule by mouth daily.          Marland Kitchen LORAZEPAM 0.5 MG PO TABS   Oral   Take 0.5 mg by mouth every 12 (twelve) hours as needed. For anxiety         . METOPROLOL TARTRATE 50 MG PO TABS   Oral   Take 12.5 mg by mouth 2 (two) times daily.         . ADULT MULTIVITAMIN W/MINERALS CH   Oral   Take 1 tablet by mouth daily.         Marland Kitchen SIMVASTATIN 40 MG PO TABS   Oral   Take 40 mg by mouth every evening.         . WARFARIN SODIUM 5 MG PO TABS   Oral   Take 5 mg by mouth daily.             BP 152/79  Pulse 73  Temp 97.9 F (36.6 C) (Oral)  Resp 16  Ht 5\' 7"  (1.702 m)  Wt 145 lb (65.772 kg)  BMI 22.71 kg/m2  SpO2 98%  Physical Exam  Nursing note and vitals reviewed. Constitutional: He is oriented to person, place, and time. He appears well-developed. No distress.  HENT:  Head: Normocephalic and atraumatic.  Eyes: Conjunctivae normal and EOM are normal.  Cardiovascular: Normal rate.  An irregularly irregular rhythm present.  Pulmonary/Chest: Effort normal. No stridor. No respiratory distress.       Anterior CABG scar  Abdominal: He exhibits no distension.  Musculoskeletal: He exhibits no edema.  Neurological: He is alert and oriented to person, place, and time.  Skin: Skin is warm and dry.  Psychiatric: He has a normal mood and affect.    ED Course  Procedures (including critical care time)   Labs Reviewed  CBC  COMPREHENSIVE METABOLIC PANEL  PROTIME-INR   No results found.   No diagnosis found.  Cardiac: 70afib, abnormal  O2 100%ra, normal   EMS RS interpreted as well - similar to this ECG   Date: 09/13/2012  Rate: 68  Rhythm: atrial fibrillation  QRS Axis: left  Intervals: normal  ST/T Wave abnormalities: nonspecific T wave changes  Conduction Disutrbances:none  Narrative Interpretation:   Old EKG  Reviewed: unchanged  ABNORMAL   2:38 PM Patient remains in nad, no new complaints, MDM  This pleasant elderly male with multiple medical problems, including CAD, prior bypass, as well as chronic chest pain presents after chest pain.  Given the patient's description of pain that was atypical for his pain, though it resolved entirely, though he was in no distress, though his labs and vital signs are all reassuring, I discussed his case with our cardiologist, who take care of the patient..  they note that the patient is appropriate for discharge with close followup, which they will accommodate.  The patient's emergency room stay he remained in no distress, with no additional episodes of pain.  We discussed all findings with the patient's wife and family member.        Gerhard Munch, MD 09/13/12 1438

## 2012-11-08 ENCOUNTER — Ambulatory Visit: Payer: Self-pay | Admitting: Cardiovascular Disease

## 2012-11-08 DIAGNOSIS — I482 Chronic atrial fibrillation, unspecified: Secondary | ICD-10-CM

## 2013-01-23 ENCOUNTER — Ambulatory Visit (INDEPENDENT_AMBULATORY_CARE_PROVIDER_SITE_OTHER): Payer: PRIVATE HEALTH INSURANCE | Admitting: Pharmacist Clinician (PhC)/ Clinical Pharmacy Specialist

## 2013-01-23 VITALS — BP 132/80 | HR 64

## 2013-01-23 DIAGNOSIS — Z7901 Long term (current) use of anticoagulants: Secondary | ICD-10-CM

## 2013-01-23 DIAGNOSIS — I4891 Unspecified atrial fibrillation: Secondary | ICD-10-CM

## 2013-01-23 DIAGNOSIS — I482 Chronic atrial fibrillation, unspecified: Secondary | ICD-10-CM

## 2013-02-20 ENCOUNTER — Ambulatory Visit (INDEPENDENT_AMBULATORY_CARE_PROVIDER_SITE_OTHER): Payer: PRIVATE HEALTH INSURANCE | Admitting: Pharmacist Clinician (PhC)/ Clinical Pharmacy Specialist

## 2013-02-20 VITALS — BP 110/64 | HR 66

## 2013-02-20 DIAGNOSIS — Z7901 Long term (current) use of anticoagulants: Secondary | ICD-10-CM

## 2013-02-20 DIAGNOSIS — I482 Chronic atrial fibrillation, unspecified: Secondary | ICD-10-CM

## 2013-02-20 DIAGNOSIS — I4891 Unspecified atrial fibrillation: Secondary | ICD-10-CM

## 2013-02-20 MED ORDER — WARFARIN SODIUM 5 MG PO TABS: ORAL_TABLET | ORAL | Status: DC

## 2013-03-19 ENCOUNTER — Inpatient Hospital Stay
Admission: RE | Admit: 2013-03-19 | Discharge: 2013-03-19 | Disposition: A | Discharge status: Home / Self Care | Payer: Self-pay | Source: Ambulatory Visit | Attending: Cardiovascular Disease | Admitting: Cardiovascular Disease

## 2013-03-19 ENCOUNTER — Ambulatory Visit (INDEPENDENT_AMBULATORY_CARE_PROVIDER_SITE_OTHER): Payer: PRIVATE HEALTH INSURANCE | Admitting: Cardiovascular Disease

## 2013-03-19 ENCOUNTER — Encounter: Payer: Self-pay | Admitting: Cardiovascular Disease

## 2013-03-19 VITALS — BP 138/84 | HR 68 | Resp 16 | Ht 67.0 in | Wt 148.1 lb

## 2013-03-19 DIAGNOSIS — E785 Hyperlipidemia, unspecified: Secondary | ICD-10-CM

## 2013-03-19 DIAGNOSIS — I2510 Atherosclerotic heart disease of native coronary artery without angina pectoris: Secondary | ICD-10-CM

## 2013-03-19 DIAGNOSIS — I714 Abdominal aortic aneurysm, without rupture, unspecified: Secondary | ICD-10-CM

## 2013-03-19 MED ORDER — METOPROLOL TARTRATE 12.5 MG HALF TABLET: 12.5000 mg | ORAL_TABLET | Freq: Two times a day (BID) | ORAL | Status: DC

## 2013-03-19 NOTE — Assessment & Plan Note (Signed)
Good control

## 2013-03-19 NOTE — Patient Instructions (Addendum)
Your physician recommends that you schedule a follow-up appointment in: 12 MONTHS  Your physician has requested that you have an abdominal aorta duplex. During this test, an ultrasound is used to evaluate the aorta. Allow 30 minutes for this exam. Do not eat after midnight the day before and avoid carbonated beverages. This test will be performed in November 2014.

## 2013-03-19 NOTE — Assessment & Plan Note (Signed)
His most recent lipid profile shows acceptable values. Notes that he was intolerance to atorvastatin and simvastatin as well as zetia. Will avoid increasing the dose of Crestor further to avoid recurrent side effects.

## 2013-03-19 NOTE — Progress Notes (Signed)
Patient ID: Gary Valencia, male   DOB: 1922/09/04, 79 y.o.   MRN: [id number]     Reason for office visit CAD status post CABG, atrial fibrillation  Gary Valencia is now 30 years status post multivessel bypass surgery and has not had any new coronary event since that time. He continues to be asymptomatic despite working in his yard in keeping a full garden. He has permanent atrial fibrillation on warfarin anticoagulation and has occasional nuisance bleeding especially on his forearms, but has not had any seizures bleeding complications. He does not have a history of stroke or TIA. He is tolerating the current dose of Crestor without side effects, but was intolerant of several other statin medications in the past.    Allergies  Allergen Reactions  . Asa Buff (Mag (Buffered Aspirin)   . Zetia (Ezetimibe)     Current Outpatient Prescriptions  Medication Sig Dispense Refill  . benazepril (LOTENSIN) 10 MG tablet Take 10 mg by mouth daily.       Marland Kitchen Cod Liver Oil 1250-135 UNITS CAPS Take 1 capsule by mouth daily.       . metoprolol (LOPRESSOR) 12.5 mg TABS Take 0.5 tablets (12.5 mg total) by mouth 2 (two) times daily.  45 tablet  3  . Multiple Vitamin (MULITIVITAMIN WITH MINERALS) TABS Take 1 tablet by mouth daily.      . rosuvastatin (CRESTOR) 10 MG tablet Take 10 mg by mouth daily.      Marland Kitchen warfarin (COUMADIN) 5 MG tablet Take 1 tablet by mouth daily or as directed by coumadin clinic  90 tablet  3   No current facility-administered medications for this visit.    Past Medical History  Diagnosis Date  . ASHD (arteriosclerotic heart disease) 1984  . Cancer 1999    PROSTATE  . Personal history of colonic polyps   . Dyslipidemia   . AAA (abdominal aortic aneurysm)   . Diverticulosis   . Actinic keratoses   . Anxiety     No past surgical history on file.  Family History  Problem Relation Age of Onset  . Arthritis Mother   . Heart disease Father     History   Social History  .  Marital Status: Married    Spouse Name: N/A    Number of Children: N/A  . Years of Education: N/A   Occupational History  . Not on file.   Social History Main Topics  . Smoking status: Never Smoker   . Smokeless tobacco: Never Used  . Alcohol Use: No  . Drug Use: No  . Sexually Active: Not on file   Other Topics Concern  . Not on file   Social History Narrative  . No narrative on file    Review of systems: The patient specifically denies any chest pain at rest or with exertion, dyspnea at rest or with exertion, orthopnea, paroxysmal nocturnal dyspnea, syncope, palpitations, focal neurological deficits, intermittent claudication, lower extremity edema, unexplained weight gain, cough, hemoptysis or wheezing.  The patient also denies abdominal pain, nausea, vomiting, dysphagia, diarrhea, constipation, polyuria, polydipsia, dysuria, hematuria, frequency, urgency, abnormal bleeding or bruising, fever, chills, unexpected weight changes, mood swings, change in skin or hair texture, change in voice quality, auditory or visual problems, allergic reactions or rashes, new musculoskeletal complaints other than usual "aches and pains".   PHYSICAL EXAM BP 138/84  Pulse 68  Resp 16  Ht 5\' 7"  (1.702 m)  Wt 148 lb 1.6 oz (67.178 kg)  BMI 23.19 kg/m2  General: Alert, oriented x3, no distress Head: no evidence of trauma, PERRL, EOMI, no exophtalmos or lid lag, no myxedema, no xanthelasma; normal ears, nose and oropharynx Neck: normal jugular venous pulsations and no hepatojugular reflux; brisk carotid pulses without delay and no carotid bruits Chest: clear to auscultation, no signs of consolidation by percussion or palpation, normal fremitus, symmetrical and full respiratory excursions Cardiovascular: normal position and quality of the apical impulse, irregular rhythm, normal first and second heart sounds, no murmurs, rubs or gallops Abdomen: no tenderness or distention, no masses by palpation,  no abnormal pulsatility or arterial bruits, normal bowel sounds, no hepatosplenomegaly Extremities: no clubbing, cyanosis or edema; 2+ radial, ulnar and brachial pulses bilaterally; 2+ right femoral, posterior tibial and dorsalis pedis pulses; 2+ left femoral, posterior tibial and dorsalis pedis pulses; no subclavian or femoral bruits Neurological: grossly nonfocal   EKG: Atrial fibrillation, otherwise normal  Lipid Panel March 2014 total cholesterol 170, triglycerides 102, HDL 58, LDL 92 Creatinine 2.13, normal liver function tests    Component Value Date/Time   CHOL 175 12/10/2011 1017   TRIG 82 12/10/2011 1017   HDL 56 12/10/2011 1017   CHOLHDL 3.1 12/10/2011 1017   VLDL 16 12/10/2011 1017   LDLCALC 103* 12/10/2011 1017    BMET    Component Value Date/Time   NA 136 09/13/2012 0948   K 4.8 09/13/2012 0948   CL 102 09/13/2012 0948   CO2 22 09/13/2012 0948   GLUCOSE 104* 09/13/2012 0948   BUN 26* 09/13/2012 0948   CREATININE 1.26 09/13/2012 0948   CREATININE 1.19 10/11/2011 0949   CALCIUM 9.3 09/13/2012 0948   GFRNONAA 49* 09/13/2012 0948   GFRAA 57* 09/13/2012 0948     ASSESSMENT AND PLAN AAA (4.09 cm on last [place] -2013) This is asymptomatic but serial ultrasound studies suggest slow growth. Repeat aortic ultrasound this November.  CAD - S/P CABG in 1984 He has had remarkably durable benefit from his bypass surgery. He is asymptomatic despite being remarkably active for his age. His last functional study was a Persantine Myoview in July of 2012 that showed normal myocardial perfusion. He has preserved left ventricular systolic function. No changes are made to his medications appear  Atrial fibrillation, permanent He is appropriately anticoagulated without bleeding complications. She does not have a history of TIA/stroke or other known embolic event.  HTN (hypertension) Good control  Hyperlipidemia His most recent lipid profile shows acceptable values. Notes that he was intolerance to  atorvastatin and simvastatin as well as zetia. Will avoid increasing the dose of Crestor further to avoid recurrent side effects.  Orders Placed This Encounter  Procedures  . US Aorta  . EKG 12-Lead   Meds ordered this encounter  Medications  . rosuvastatin (CRESTOR) 10 MG tablet    Sig: Take 10 mg by mouth daily.  . metoprolol (LOPRESSOR) 12.5 mg TABS    Sig: Take 0.5 tablets (12.5 mg total) by mouth 2 (two) times daily.    Dispense:  45 tablet    Refill:  3    Azari Hasler  Thurmon Fair, MD, [facility] [phone] office [phone] pager

## 2013-03-19 NOTE — Assessment & Plan Note (Signed)
He is appropriately anticoagulated without bleeding complications. She does not have a history of TIA/stroke or other known embolic event.

## 2013-03-19 NOTE — Assessment & Plan Note (Signed)
This is asymptomatic but serial ultrasound studies suggest slow growth. Repeat aortic ultrasound this November.

## 2013-03-19 NOTE — Assessment & Plan Note (Signed)
He has had remarkably durable benefit from his bypass surgery. He is asymptomatic despite being remarkably active for his age. His last functional study was a Persantine Myoview in July of 2012 that showed normal myocardial perfusion. He has preserved left ventricular systolic function. No changes are made to his medications appear

## 2013-04-04 ENCOUNTER — Ambulatory Visit (INDEPENDENT_AMBULATORY_CARE_PROVIDER_SITE_OTHER): Payer: PRIVATE HEALTH INSURANCE | Admitting: Pharmacist Clinician (PhC)/ Clinical Pharmacy Specialist

## 2013-04-04 VITALS — BP 110/70 | HR 64

## 2013-04-04 DIAGNOSIS — I482 Chronic atrial fibrillation, unspecified: Secondary | ICD-10-CM

## 2013-04-04 DIAGNOSIS — Z7901 Long term (current) use of anticoagulants: Secondary | ICD-10-CM

## 2013-04-04 DIAGNOSIS — I4891 Unspecified atrial fibrillation: Secondary | ICD-10-CM

## 2013-04-04 LAB — POCT INR: INR: 2.9

## 2013-05-09 ENCOUNTER — Ambulatory Visit (INDEPENDENT_AMBULATORY_CARE_PROVIDER_SITE_OTHER): Payer: PRIVATE HEALTH INSURANCE | Admitting: Pharmacist Clinician (PhC)/ Clinical Pharmacy Specialist

## 2013-05-09 DIAGNOSIS — I482 Chronic atrial fibrillation, unspecified: Secondary | ICD-10-CM

## 2013-05-09 DIAGNOSIS — Z7901 Long term (current) use of anticoagulants: Secondary | ICD-10-CM

## 2013-05-09 DIAGNOSIS — I4891 Unspecified atrial fibrillation: Secondary | ICD-10-CM

## 2013-05-10 ENCOUNTER — Ambulatory Visit: Payer: PRIVATE HEALTH INSURANCE | Admitting: Pharmacist Clinician (PhC)/ Clinical Pharmacy Specialist

## 2013-05-11 ENCOUNTER — Ambulatory Visit: Payer: PRIVATE HEALTH INSURANCE | Admitting: Pharmacist Clinician (PhC)/ Clinical Pharmacy Specialist

## 2013-06-06 ENCOUNTER — Ambulatory Visit: Payer: PRIVATE HEALTH INSURANCE | Admitting: Pharmacist Clinician (PhC)/ Clinical Pharmacy Specialist

## 2013-06-08 ENCOUNTER — Ambulatory Visit (INDEPENDENT_AMBULATORY_CARE_PROVIDER_SITE_OTHER): Payer: PRIVATE HEALTH INSURANCE | Admitting: Pharmacist Clinician (PhC)/ Clinical Pharmacy Specialist

## 2013-06-08 VITALS — BP 138/76 | HR 52

## 2013-06-08 DIAGNOSIS — I4891 Unspecified atrial fibrillation: Secondary | ICD-10-CM

## 2013-06-08 DIAGNOSIS — Z7901 Long term (current) use of anticoagulants: Secondary | ICD-10-CM

## 2013-06-08 DIAGNOSIS — I482 Chronic atrial fibrillation, unspecified: Secondary | ICD-10-CM

## 2013-06-08 LAB — POCT INR: INR: 2.4

## 2013-06-25 ENCOUNTER — Encounter (HOSPITAL_COMMUNITY): Payer: PRIVATE HEALTH INSURANCE

## 2013-07-09 ENCOUNTER — Ambulatory Visit (INDEPENDENT_AMBULATORY_CARE_PROVIDER_SITE_OTHER): Payer: PRIVATE HEALTH INSURANCE | Admitting: Pharmacist Clinician (PhC)/ Clinical Pharmacy Specialist

## 2013-07-09 ENCOUNTER — Other Ambulatory Visit (HOSPITAL_COMMUNITY): Payer: Self-pay | Admitting: Cardiovascular Disease

## 2013-07-09 VITALS — BP 130/72 | HR 64

## 2013-07-09 DIAGNOSIS — I4891 Unspecified atrial fibrillation: Secondary | ICD-10-CM

## 2013-07-09 DIAGNOSIS — I482 Chronic atrial fibrillation, unspecified: Secondary | ICD-10-CM

## 2013-07-09 DIAGNOSIS — Z7901 Long term (current) use of anticoagulants: Secondary | ICD-10-CM

## 2013-07-09 DIAGNOSIS — I714 Abdominal aortic aneurysm, without rupture: Secondary | ICD-10-CM

## 2013-07-09 LAB — POCT INR: INR: 2.7

## 2013-07-11 ENCOUNTER — Ambulatory Visit (HOSPITAL_COMMUNITY)
Admission: RE | Admit: 2013-07-11 | Discharge: 2013-07-11 | Disposition: A | Discharge status: Home / Self Care | Payer: PRIVATE HEALTH INSURANCE | Source: Ambulatory Visit | Attending: Cardiovascular Disease | Admitting: Cardiovascular Disease

## 2013-07-11 DIAGNOSIS — I714 Abdominal aortic aneurysm, without rupture, unspecified: Secondary | ICD-10-CM | POA: Insufficient documentation

## 2013-07-11 NOTE — Progress Notes (Signed)
Aorta Duplex Completed. Lyle Niblett, BS, RDMS, RVT  

## 2013-07-17 NOTE — Progress Notes (Signed)
[facility] for Abd Duplex results.  Slight increase in size since last year - was 4.02cm, now 4.3cm.

## 2013-07-18 ENCOUNTER — Telehealth: Payer: Self-pay | Admitting: *Deleted

## 2013-07-18 ENCOUNTER — Telehealth: Payer: Self-pay | Admitting: Cardiovascular Disease

## 2013-07-18 DIAGNOSIS — I714 Abdominal aortic aneurysm, without rupture: Secondary | ICD-10-CM

## 2013-07-18 NOTE — Telephone Encounter (Signed)
Message copied by Vita Barley on Wed Jul 18, 2013  1:01 PM ------      Message from: [place], [place]      Created: Fri Jul 13, 2013  2:20 PM       Tiny increase in AAA size, now 4.3 cm. Repeat in 12 months please ------

## 2013-07-18 NOTE — Telephone Encounter (Signed)
Returning your call. °

## 2013-07-18 NOTE — Telephone Encounter (Signed)
Abdominal duplex results called and given to wife.  Will repeat study in 12 months.  Voiced understanding.

## 2013-08-06 ENCOUNTER — Ambulatory Visit (INDEPENDENT_AMBULATORY_CARE_PROVIDER_SITE_OTHER): Payer: PRIVATE HEALTH INSURANCE | Admitting: Pharmacist Clinician (PhC)/ Clinical Pharmacy Specialist

## 2013-08-06 VITALS — BP 118/76 | HR 56

## 2013-08-06 DIAGNOSIS — I4891 Unspecified atrial fibrillation: Secondary | ICD-10-CM

## 2013-08-06 DIAGNOSIS — I482 Chronic atrial fibrillation, unspecified: Secondary | ICD-10-CM

## 2013-08-06 DIAGNOSIS — Z7901 Long term (current) use of anticoagulants: Secondary | ICD-10-CM

## 2013-08-06 LAB — POCT INR: INR: 3.3

## 2013-09-03 ENCOUNTER — Ambulatory Visit (INDEPENDENT_AMBULATORY_CARE_PROVIDER_SITE_OTHER): Payer: PRIVATE HEALTH INSURANCE | Admitting: Pharmacist Clinician (PhC)/ Clinical Pharmacy Specialist

## 2013-09-03 VITALS — BP 124/62 | HR 64

## 2013-09-03 DIAGNOSIS — Z7901 Long term (current) use of anticoagulants: Secondary | ICD-10-CM

## 2013-09-03 DIAGNOSIS — I482 Chronic atrial fibrillation, unspecified: Secondary | ICD-10-CM

## 2013-09-03 DIAGNOSIS — I4891 Unspecified atrial fibrillation: Secondary | ICD-10-CM

## 2013-09-03 LAB — POCT INR: INR: 3

## 2013-10-01 ENCOUNTER — Ambulatory Visit (INDEPENDENT_AMBULATORY_CARE_PROVIDER_SITE_OTHER): Payer: PRIVATE HEALTH INSURANCE | Admitting: Pharmacist Clinician (PhC)/ Clinical Pharmacy Specialist

## 2013-10-01 VITALS — BP 142/76 | HR 64

## 2013-10-01 DIAGNOSIS — Z7901 Long term (current) use of anticoagulants: Secondary | ICD-10-CM

## 2013-10-01 DIAGNOSIS — I4891 Unspecified atrial fibrillation: Secondary | ICD-10-CM

## 2013-10-01 DIAGNOSIS — I482 Chronic atrial fibrillation, unspecified: Secondary | ICD-10-CM

## 2013-10-01 LAB — POCT INR: INR: 2.7

## 2013-10-08 ENCOUNTER — Encounter (HOSPITAL_BASED_OUTPATIENT_CLINIC_OR_DEPARTMENT_OTHER): Payer: Self-pay | Admitting: Emergency Medicine

## 2013-10-08 ENCOUNTER — Emergency Department (HOSPITAL_BASED_OUTPATIENT_CLINIC_OR_DEPARTMENT_OTHER)
Admission: EM | Admit: 2013-10-08 | Discharge: 2013-10-08 | Disposition: A | Discharge status: Home / Self Care | Payer: PRIVATE HEALTH INSURANCE | Attending: Emergency Medicine | Admitting: Emergency Medicine

## 2013-10-08 ENCOUNTER — Emergency Department (HOSPITAL_BASED_OUTPATIENT_CLINIC_OR_DEPARTMENT_OTHER): Payer: PRIVATE HEALTH INSURANCE

## 2013-10-08 DIAGNOSIS — Z8659 Personal history of other mental and behavioral disorders: Secondary | ICD-10-CM | POA: Insufficient documentation

## 2013-10-08 DIAGNOSIS — Z860100 Personal history of colon polyps, unspecified: Secondary | ICD-10-CM | POA: Insufficient documentation

## 2013-10-08 DIAGNOSIS — Z7901 Long term (current) use of anticoagulants: Secondary | ICD-10-CM | POA: Insufficient documentation

## 2013-10-08 DIAGNOSIS — IMO0002 Reserved for concepts with insufficient information to code with codable children: Secondary | ICD-10-CM

## 2013-10-08 DIAGNOSIS — I2510 Atherosclerotic heart disease of native coronary artery without angina pectoris: Secondary | ICD-10-CM | POA: Insufficient documentation

## 2013-10-08 DIAGNOSIS — E785 Hyperlipidemia, unspecified: Secondary | ICD-10-CM | POA: Insufficient documentation

## 2013-10-08 DIAGNOSIS — Y939 Activity, unspecified: Secondary | ICD-10-CM | POA: Insufficient documentation

## 2013-10-08 DIAGNOSIS — W010XXA Fall on same level from slipping, tripping and stumbling without subsequent striking against object, initial encounter: Secondary | ICD-10-CM | POA: Insufficient documentation

## 2013-10-08 DIAGNOSIS — Y929 Unspecified place or not applicable: Secondary | ICD-10-CM | POA: Insufficient documentation

## 2013-10-08 DIAGNOSIS — Z23 Encounter for immunization: Secondary | ICD-10-CM | POA: Insufficient documentation

## 2013-10-08 DIAGNOSIS — Z8546 Personal history of malignant neoplasm of prostate: Secondary | ICD-10-CM | POA: Insufficient documentation

## 2013-10-08 DIAGNOSIS — S81009A Unspecified open wound, unspecified knee, initial encounter: Secondary | ICD-10-CM | POA: Insufficient documentation

## 2013-10-08 DIAGNOSIS — Z872 Personal history of diseases of the skin and subcutaneous tissue: Secondary | ICD-10-CM | POA: Insufficient documentation

## 2013-10-08 DIAGNOSIS — Z8719 Personal history of other diseases of the digestive system: Secondary | ICD-10-CM | POA: Insufficient documentation

## 2013-10-08 DIAGNOSIS — S81809A Unspecified open wound, unspecified lower leg, initial encounter: Principal | ICD-10-CM

## 2013-10-08 DIAGNOSIS — S91009A Unspecified open wound, unspecified ankle, initial encounter: Principal | ICD-10-CM

## 2013-10-08 DIAGNOSIS — Z79899 Other long term (current) drug therapy: Secondary | ICD-10-CM | POA: Insufficient documentation

## 2013-10-08 MED ORDER — CEPHALEXIN 500 MG PO CAPS: 500.0000 mg | ORAL_CAPSULE | Freq: Three times a day (TID) | ORAL | Status: DC

## 2013-10-08 MED ORDER — TETANUS-DIPHTH-ACELL PERTUSSIS 5-2.5-18.5 LF-MCG/0.5 IM SUSP
0.5000 mL | Freq: Once | INTRAMUSCULAR | Status: AC
  Administered 2013-10-08: 0.5 mL via INTRAMUSCULAR
  Filled 2013-10-08: qty 0.5

## 2013-10-08 NOTE — ED Provider Notes (Signed)
Medical screening examination/treatment/procedure(s) were conducted as a shared visit with non-physician practitioner(s) and myself.  I personally evaluated the patient during the encounter.  EKG Interpretation   None       See my primary note  Malvin Johns, MD 10/08/13 1731

## 2013-10-08 NOTE — Discharge Instructions (Signed)

## 2013-10-08 NOTE — ED Notes (Signed)
Tripped and fell on the concrete and brick patio. Laceration to his left knee. Bleeding controlled.

## 2013-10-08 NOTE — ED Provider Notes (Signed)
CSN: [id number]     Arrival date & time 10/08/13  1606 History  This chart was scribed for Malvin Johns, MD by Maree Erie, ED Scribe. The patient was seen in room MH04/MH04. Patient's care was started at 4:59 PM.      Chief Complaint  Patient presents with  . Fall  . Extremity Laceration     Patient is a 80 y.o. male presenting with fall. The history is provided by the patient. No language interpreter was used.  Fall Pertinent negatives include no headaches.    HPI Comments: Gary Valencia is a 80 y.o. male who presents to the Emergency Department due to a fall that occurred just prior to arrival. He states that he tripped going up concrete patio stairs and landed on his left knee on the corner of a step. He reports a laceration to his left knee. The bleeding is currently controlled. He denies any pain to the affected area. He does not know if he is up to date on his Tetanus vaccination.  He is on coumadin, but denies hitting his head.    Past Medical History  Diagnosis Date  . ASHD (arteriosclerotic heart disease) 1984  . Cancer 1999    PROSTATE  . Personal history of colonic polyps   . Dyslipidemia   . AAA (abdominal aortic aneurysm)   . Diverticulosis   . Actinic keratoses   . Anxiety    History reviewed. No pertinent past surgical history. Family History  Problem Relation Age of Onset  . Arthritis Mother   . Heart disease Father    History  Substance Use Topics  . Smoking status: Never Smoker   . Smokeless tobacco: Never Used  . Alcohol Use: No    Review of Systems  Constitutional: Negative for fever.  Gastrointestinal: Negative for nausea and vomiting.  Musculoskeletal: Negative for arthralgias, back pain, joint swelling and neck pain.  Skin: Positive for wound.  Neurological: Negative for weakness, numbness and headaches.      Allergies  Asa buff (mag and Zetia  Home Medications   Current Outpatient Rx  Name  Route  Sig  Dispense  Refill  .  benazepril (LOTENSIN) 10 MG tablet   Oral   Take 10 mg by mouth daily.          Marland Kitchen Cod Liver Oil 1250-135 UNITS CAPS   Oral   Take 1 capsule by mouth daily.          . metoprolol (LOPRESSOR) 12.5 mg TABS   Oral   Take 0.5 tablets (12.5 mg total) by mouth 2 (two) times daily.   45 tablet   3   . Multiple Vitamin (MULITIVITAMIN WITH MINERALS) TABS   Oral   Take 1 tablet by mouth daily.         . rosuvastatin (CRESTOR) 10 MG tablet   Oral   Take 10 mg by mouth daily.         Marland Kitchen warfarin (COUMADIN) 5 MG tablet      Take 1 tablet by mouth daily or as directed by coumadin clinic   90 tablet   3    Triage Vitals: BP 144/80  Pulse 50  Temp(Src) 97.6 F (36.4 C) (Oral)  Resp 20  Ht 5\' 7"  (1.702 m)  Wt 148 lb (67.132 kg)  BMI 23.17 kg/m2  SpO2 99%  Physical Exam  Nursing note and vitals reviewed. Constitutional: He is oriented to person, place, and time. He appears well-developed and well-nourished.  HENT:  Head: Normocephalic and atraumatic.  Neck: Normal range of motion. Neck supple.  Cardiovascular: Normal rate.   Pulmonary/Chest: Effort normal.  Neurological: He is alert and oriented to person, place, and time.  Skin: Skin is warm and dry.  5 cm L shaped laceration overlying left patella. Goes down to fascia. No underlying bony tenderness. Quadricept and patellar tendons intact.   Psychiatric: He has a normal mood and affect.    ED Course  Procedures (including critical care time)  DIAGNOSTIC STUDIES: Oxygen Saturation is 99% on room air, normal by my interpretation.    COORDINATION OF CARE: 5:03 PM -Will have Debroah Baller, NP, repair laceration. Patient verbalizes understanding and agrees with treatment plan.    Labs Review Labs Reviewed - No data to display Imaging Review Dg Knee Complete 4 Views Left  10/08/2013   CLINICAL DATA:  Slipped and fell.  Anterior laceration.  EXAM: LEFT KNEE - COMPLETE 4+ VIEW  COMPARISON:  No comparison studies  available.  FINDINGS: Four views study shows no evidence for an acute fracture. No dislocation. Degenerative spurring is visible in all 3 compartments. The patella is intact. There is no evidence for joint effusion. Atherosclerotic calcification noted in the popliteal artery.  IMPRESSION: No acute bony findings.   Electronically Signed   By: Misty Stanley M.D.   On: 10/08/2013 16:40    EKG Interpretation   None       MDM   Final diagnoses:  Laceration    Wound was repaired by Debroah Baller, NP.  Will start on abx, updated TDAP.  Pt given wound care instructions, advised to f/u with PMD for suture removal in 12-14 days  I personally performed the services described in this documentation, which was scribed in my presence.  The recorded information has been reviewed and considered.    Malvin Johns, MD 10/08/13 [phone]

## 2013-10-08 NOTE — ED Provider Notes (Signed)
Gary Valencia is a 81 y.o. male with a laceration to the left knee s/p fall.  THIS IS A SHARED VISIT WITH DR. BELFI  LACERATION REPAIR Performed by: Aron Needles Authorized by: Raynell Upton Consent: Verbal consent obtained. Risks and benefits: risks, benefits and alternatives were discussed Consent given by: patient Patient identity confirmed: provided demographic data Prepped and Draped in normal sterile fashion Wound explored  Laceration Location: left knee  Laceration Length: 5 cm  No Foreign Bodies seen or palpated  Anesthesia: local infiltration  Local anesthetic: lidocaine 2% without epinephrine  Anesthetic total: 3 ml  Irrigation method: syringe Amount of cleaning: standard  Closed inside with 5-0 vicryl x 1  Skin closure: staples  Number of staples: 6  Patient tolerance: Patient tolerated the procedure well with no immediate complications.  Bacitracin ointment, pressure dressing, ace wrap. Patient to follow up for staple removal in 10 days with his PCP or return here as needed for problems.    [place], [place] 10/08/13 [phone]

## 2013-10-23 ENCOUNTER — Ambulatory Visit (INDEPENDENT_AMBULATORY_CARE_PROVIDER_SITE_OTHER): Payer: Medicare Other | Admitting: Family Medicine

## 2013-10-23 ENCOUNTER — Encounter: Payer: Self-pay | Admitting: Family Medicine

## 2013-10-23 VITALS — BP 100/70 | HR 60 | Wt 155.0 lb

## 2013-10-23 DIAGNOSIS — S81009A Unspecified open wound, unspecified knee, initial encounter: Secondary | ICD-10-CM

## 2013-10-23 DIAGNOSIS — S81809A Unspecified open wound, unspecified lower leg, initial encounter: Secondary | ICD-10-CM

## 2013-10-23 DIAGNOSIS — S91009A Unspecified open wound, unspecified ankle, initial encounter: Secondary | ICD-10-CM

## 2013-10-23 DIAGNOSIS — S81012A Laceration without foreign body, left knee, initial encounter: Secondary | ICD-10-CM

## 2013-10-23 NOTE — Progress Notes (Signed)
   Subjective:    Patient ID: Gary Valencia, male    DOB: 08/14/1923, 80 y.o.   MRN: [id number]  HPI  he is here for staple remover. He did fall and sustained a laceration to the left knee. He was placed on antibiotics. It has been approximately 2 weeks.   Review of Systems     Objective:   Physical Exam Left knee exam does show some slight erythema but is not warm or tender.       Assessment & Plan:  Laceration of knee, left  the staples were removed without difficulty. The wound was covered with a Steri-Strip. I recommended that he keep this on for several more days he'll call if he has any trouble

## 2013-11-12 ENCOUNTER — Ambulatory Visit (INDEPENDENT_AMBULATORY_CARE_PROVIDER_SITE_OTHER): Payer: PRIVATE HEALTH INSURANCE | Admitting: Pharmacist Clinician (PhC)/ Clinical Pharmacy Specialist

## 2013-11-12 DIAGNOSIS — I4891 Unspecified atrial fibrillation: Secondary | ICD-10-CM

## 2013-11-12 DIAGNOSIS — Z7901 Long term (current) use of anticoagulants: Secondary | ICD-10-CM

## 2013-11-12 DIAGNOSIS — I482 Chronic atrial fibrillation, unspecified: Secondary | ICD-10-CM

## 2013-11-12 LAB — POCT INR: INR: 3.2

## 2013-11-13 ENCOUNTER — Other Ambulatory Visit: Payer: Self-pay | Admitting: Pharmacist Clinician (PhC)/ Clinical Pharmacy Specialist

## 2013-11-13 MED ORDER — ROSUVASTATIN CALCIUM 10 MG PO TABS: 10.0000 mg | ORAL_TABLET | Freq: Every day | ORAL | Status: DC

## 2013-11-13 MED ORDER — BENAZEPRIL HCL 10 MG PO TABS: 10.0000 mg | ORAL_TABLET | Freq: Every day | ORAL | Status: DC

## 2013-12-24 ENCOUNTER — Ambulatory Visit (INDEPENDENT_AMBULATORY_CARE_PROVIDER_SITE_OTHER): Payer: PRIVATE HEALTH INSURANCE | Admitting: Pharmacist Clinician (PhC)/ Clinical Pharmacy Specialist

## 2013-12-24 DIAGNOSIS — Z7901 Long term (current) use of anticoagulants: Secondary | ICD-10-CM

## 2013-12-24 DIAGNOSIS — I482 Chronic atrial fibrillation, unspecified: Secondary | ICD-10-CM

## 2013-12-24 DIAGNOSIS — I4891 Unspecified atrial fibrillation: Secondary | ICD-10-CM

## 2013-12-24 LAB — POCT INR: INR: 2.8

## 2014-01-28 ENCOUNTER — Ambulatory Visit (INDEPENDENT_AMBULATORY_CARE_PROVIDER_SITE_OTHER): Payer: PRIVATE HEALTH INSURANCE | Admitting: Pharmacist Clinician (PhC)/ Clinical Pharmacy Specialist

## 2014-01-28 DIAGNOSIS — I482 Chronic atrial fibrillation, unspecified: Secondary | ICD-10-CM

## 2014-01-28 DIAGNOSIS — I4891 Unspecified atrial fibrillation: Secondary | ICD-10-CM

## 2014-01-28 DIAGNOSIS — Z7901 Long term (current) use of anticoagulants: Secondary | ICD-10-CM

## 2014-01-28 LAB — POCT INR: INR: 3.1

## 2014-03-08 ENCOUNTER — Other Ambulatory Visit: Payer: Self-pay | Admitting: Pharmacist Clinician (PhC)/ Clinical Pharmacy Specialist

## 2014-03-08 NOTE — Telephone Encounter (Signed)
Rx was sent to pharmacy electronically. 

## 2014-03-22 ENCOUNTER — Ambulatory Visit (INDEPENDENT_AMBULATORY_CARE_PROVIDER_SITE_OTHER): Payer: PRIVATE HEALTH INSURANCE | Admitting: Cardiovascular Disease

## 2014-03-22 ENCOUNTER — Ambulatory Visit (INDEPENDENT_AMBULATORY_CARE_PROVIDER_SITE_OTHER): Payer: PRIVATE HEALTH INSURANCE | Admitting: Pharmacist Clinician (PhC)/ Clinical Pharmacy Specialist

## 2014-03-22 ENCOUNTER — Encounter: Payer: Self-pay | Admitting: Cardiovascular Disease

## 2014-03-22 VITALS — BP 134/62 | HR 61 | Resp 20 | Ht 67.0 in | Wt 150.9 lb

## 2014-03-22 DIAGNOSIS — I714 Abdominal aortic aneurysm, without rupture, unspecified: Secondary | ICD-10-CM

## 2014-03-22 DIAGNOSIS — I4891 Unspecified atrial fibrillation: Secondary | ICD-10-CM

## 2014-03-22 DIAGNOSIS — Z7901 Long term (current) use of anticoagulants: Secondary | ICD-10-CM

## 2014-03-22 DIAGNOSIS — I10 Essential (primary) hypertension: Secondary | ICD-10-CM

## 2014-03-22 DIAGNOSIS — I482 Chronic atrial fibrillation, unspecified: Secondary | ICD-10-CM

## 2014-03-22 DIAGNOSIS — E782 Mixed hyperlipidemia: Secondary | ICD-10-CM

## 2014-03-22 DIAGNOSIS — Z79899 Other long term (current) drug therapy: Secondary | ICD-10-CM

## 2014-03-22 DIAGNOSIS — E785 Hyperlipidemia, unspecified: Secondary | ICD-10-CM

## 2014-03-22 DIAGNOSIS — I2510 Atherosclerotic heart disease of native coronary artery without angina pectoris: Secondary | ICD-10-CM

## 2014-03-22 LAB — COMPREHENSIVE METABOLIC PANEL
ALBUMIN: 3.9 g/dL (ref 3.5–5.2)
ALT: 15 U/L (ref 0–53)
AST: 27 U/L (ref 0–37)
Alkaline Phosphatase: 88 U/L (ref 39–117)
BUN: 28 mg/dL — AB (ref 6–23)
CALCIUM: 9.4 mg/dL (ref 8.4–10.5)
CHLORIDE: 106 meq/L (ref 96–112)
CO2: 26 mEq/L (ref 19–32)
Creat: 1.53 mg/dL — ABNORMAL HIGH (ref 0.50–1.35)
Glucose, Bld: 85 mg/dL (ref 70–99)
POTASSIUM: 5.4 meq/L — AB (ref 3.5–5.3)
Sodium: 141 mEq/L (ref 135–145)
Total Bilirubin: 0.7 mg/dL (ref 0.2–1.2)
Total Protein: 7.4 g/dL (ref 6.0–8.3)

## 2014-03-22 LAB — LIPID PANEL
CHOLESTEROL: 157 mg/dL (ref 0–200)
HDL: 59 mg/dL (ref >39)
LDL CALC: 76 mg/dL (ref 0–99)
Total CHOL/HDL Ratio: 2.7 Ratio
Triglycerides: 109 mg/dL (ref <150)
VLDL: 22 mg/dL (ref 0–40)

## 2014-03-22 LAB — POCT INR: INR: 3

## 2014-03-22 MED ORDER — METOPROLOL TARTRATE 12.5 MG HALF TABLET: 12.5000 mg | ORAL_TABLET | Freq: Two times a day (BID) | ORAL | Status: DC

## 2014-03-22 MED ORDER — METOPROLOL TARTRATE 25 MG PO TABS: 12.5000 mg | ORAL_TABLET | Freq: Two times a day (BID) | ORAL | Status: DC

## 2014-03-22 NOTE — Patient Instructions (Addendum)
Your physician recommends that you return for lab work FASTING  Your physician has requested that you have an abdominal aorta duplex. During this test, an ultrasound is used to evaluate the aorta. Allow 30 minutes for this exam. Do not eat after midnight the day before and avoid carbonated beverages  Your physician recommends that you schedule a follow-up appointment in: 1 year with Dr.[name]

## 2014-03-23 ENCOUNTER — Encounter: Payer: Self-pay | Admitting: Cardiovascular Disease

## 2014-03-23 NOTE — Progress Notes (Signed)
Patient ID: Gary Valencia, male   DOB: [birth year], 80 y.o.   MRN: [id number]      Reason for office visit CAD s/p CABG, AFib, AAA, dyslipidemia  Gary Valencia is 80 years old and 31 years have passed since he underwent multivessel bypass surgery without any interim cardiac events. He continues to live independently with his wife and keeps working on his vegetable garden and takes care of all his yard work. He has not had any falls since his last appointment.  He has permanent atrial fibrillation on warfarin anticoagulation without a history of stroke and without bleeding complications. He has a small infrarenal, aortic aneurysm which has grown slightly at last November's ultrasound scan, from 4.1 cm to 4.3 cm. His hyperlipidemia has responded well to Crestor, but he has been intolerant of all other statins as well as Zetia. His last nuclear stress test was a dipyridamole Myoview in July of 2012 with normal findings.  He has no cardiovascular complaints today. His right foot is occasionally "puffy" at the end of the day. He reports some numbness and tingling in his hands when driving his car or as long or and wonders whether this could be vascular. This sounds neuropathic, likely carpal tunnel syndrome.  Allergies  Allergen Reactions  . Asa Buff (Mag [Buffered Aspirin]   . Zetia [Ezetimibe]     Current Outpatient Prescriptions  Medication Sig Dispense Refill  . benazepril (LOTENSIN) 10 MG tablet Take 1 tablet (10 mg total) by mouth daily.  90 tablet  2  . Cod Liver Oil 1250-135 UNITS CAPS Take 2 capsules by mouth daily.       . metoprolol tartrate (LOPRESSOR) 25 MG tablet Take 0.5 tablets (12.5 mg total) by mouth 2 (two) times daily.  90 tablet  3  . Multiple Vitamin (MULITIVITAMIN WITH MINERALS) TABS Take 1 tablet by mouth daily.      . rosuvastatin (CRESTOR) 10 MG tablet Take 1 tablet (10 mg total) by mouth daily.  90 tablet  2  . warfarin (COUMADIN) 5 MG tablet TAKE 1 TABLET DAILY OR  AS DIRECTED BY COUMADIN CLINIC  90 tablet  0   No current facility-administered medications for this visit.    Past Medical History  Diagnosis Date  . ASHD (arteriosclerotic heart disease) 80  . Cancer 1999    PROSTATE  . Personal history of colonic polyps   . Dyslipidemia   . AAA (abdominal aortic aneurysm)   . Diverticulosis   . Actinic keratoses   . Anxiety     No past surgical history on file.  Family History  Problem Relation Age of Onset  . Arthritis Mother   . Heart disease Father     History   Social History  . Marital Status: Married    Spouse Name: N/A    Number of Children: N/A  . Years of Education: N/A   Occupational History  . Not on file.   Social History Main Topics  . Smoking status: Never Smoker   . Smokeless tobacco: Never Used  . Alcohol Use: No  . Drug Use: No  . Sexual Activity: Not on file   Other Topics Concern  . Not on file   Social History Narrative  . No narrative on file    Review of systems: The patient specifically denies any chest pain at rest or with exertion, dyspnea at rest or with exertion, orthopnea, paroxysmal nocturnal dyspnea, syncope, palpitations, focal neurological deficits, intermittent claudication, lower extremity edema, unexplained  weight gain, cough, hemoptysis or wheezing.  The patient also denies abdominal pain, nausea, vomiting, dysphagia, diarrhea, constipation, polyuria, polydipsia, dysuria, hematuria, frequency, urgency, abnormal bleeding or bruising, fever, chills, unexpected weight changes, mood swings, change in skin or hair texture, change in voice quality, auditory or visual problems, allergic reactions or rashes, new musculoskeletal complaints other than usual "aches and pains".   PHYSICAL EXAM BP 134/62  Pulse 61  Resp 20  Ht _0  (1.702 m)  Wt 150 lb 14.4 oz (68.448 kg)  BMI 23.63 kg/m2 General: Alert, oriented x3, no distress  Head: no evidence of trauma, PERRL, EOMI, no exophtalmos or  lid lag, no myxedema, no xanthelasma; normal ears, nose and oropharynx  Neck: normal jugular venous pulsations and no hepatojugular reflux; brisk carotid pulses without delay and no carotid bruits  Chest: clear to auscultation, no signs of consolidation by percussion or palpation, normal fremitus, symmetrical and full respiratory excursions, sternotomy scar Cardiovascular: normal position and quality of the apical impulse, irregular rhythm, normal first and second heart sounds, no murmurs, rubs or gallops  Abdomen: no tenderness or distention, no masses by palpation, no abnormal pulsatility or arterial bruits, normal bowel sounds, no hepatosplenomegaly  Extremities: no clubbing, cyanosis or edema; 2+ radial, ulnar and brachial pulses bilaterally; 2+ right femoral, posterior tibial and dorsalis pedis pulses; 2+ left femoral, posterior tibial and dorsalis pedis pulses; no subclavian or femoral bruits  Neurological: grossly nonfocal   EKG: Atrial fibrillation, otherwise normal   Lipid Panel     Component Value Date/Time   CHOL 157 03/22/2014 0952   TRIG 109 03/22/2014 0952   HDL 59 03/22/2014 0952   CHOLHDL 2.7 03/22/2014 0952   VLDL 22 03/22/2014 0952   LDLCALC 76 03/22/2014 0952    BMET    Component Value Date/Time   NA 141 03/22/2014 0952   K 5.4* 03/22/2014 0952   CL 106 03/22/2014 0952   CO2 26 03/22/2014 0952   GLUCOSE 85 03/22/2014 0952   BUN 28* 03/22/2014 0952   CREATININE 1.53* 03/22/2014 0952   CREATININE 1.26 09/13/2012 0948   CALCIUM 9.4 03/22/2014 0952   GFRNONAA 49* 09/13/2012 0948   GFRAA 57* 09/13/2012 0948     ASSESSMENT AND PLAN  More than 30 years after his bypass procedure, Gary Valencia has had a very durable benefit without any new ischemic events and without signs of congestive heart failure. Despite permanent atrial fibrillation, he is very active. He has not had bleeding problems or recent falls and does not have a history of stroke or other embolic events. He should  continue on warfarin as long as this is safe. Will reevaluate the size of his abdominal aortic aneurysm in November. His lipid profile is excellent. His labs, which came back after the end of his visit today showed slight worsening of renal function and hyperkalemia. We'll need to repeat these. Hold his ACE inhibitor temporarily.   Orders Placed This Encounter  Procedures  . Lipid Profile  . Comp Met (CMET)  . EKG 12-Lead  . Abdominal Aortic Aneurysm duplex   Meds ordered this encounter  Medications  . DISCONTD: metoprolol tartrate (LOPRESSOR) 12.5 mg TABS tablet    Sig: Take 0.5 tablets (12.5 mg total) by mouth 2 (two) times daily.    Dispense:  45 tablet    Refill:  3  . metoprolol tartrate (LOPRESSOR) 25 MG tablet    Sig: Take 0.5 tablets (12.5 mg total) by mouth 2 (two) times daily.  Dispense:  90 tablet    Refill:  [place] Amariya Liskey, MD, [facility] [phone] office [phone] pager

## 2014-03-25 ENCOUNTER — Telehealth: Payer: Self-pay | Admitting: *Deleted

## 2014-03-25 DIAGNOSIS — Z79899 Other long term (current) drug therapy: Secondary | ICD-10-CM

## 2014-03-25 NOTE — Telephone Encounter (Signed)
Lab results called to patient.  Will stop Benazepril and recheck BMP in one week.  Patient voiced understanding.

## 2014-03-25 NOTE — Telephone Encounter (Signed)
Message copied by Tressa Busman on Mon Mar 25, 2014  7:46 AM ------      Message from: Sanda Klein      Created: Sat Mar 23, 2014  4:43 PM       Cholesterol is excellent. Kidney function is slightly worse than last year and his potassium is borderline high. Please stop taking the benazepril and recheck basic metabolic panel in a week ------

## 2014-03-29 LAB — BASIC METABOLIC PANEL
BUN: 23 mg/dL (ref 6–23)
CALCIUM: 9.2 mg/dL (ref 8.4–10.5)
CO2: 28 mEq/L (ref 19–32)
CREATININE: 1.32 mg/dL (ref 0.50–1.35)
Chloride: 105 mEq/L (ref 96–112)
Glucose, Bld: 85 mg/dL (ref 70–99)
Potassium: 5.1 mEq/L (ref 3.5–5.3)
Sodium: 139 mEq/L (ref 135–145)

## 2014-05-15 ENCOUNTER — Ambulatory Visit (INDEPENDENT_AMBULATORY_CARE_PROVIDER_SITE_OTHER): Payer: PRIVATE HEALTH INSURANCE | Admitting: Pharmacist Clinician (PhC)/ Clinical Pharmacy Specialist

## 2014-05-15 DIAGNOSIS — I482 Chronic atrial fibrillation, unspecified: Secondary | ICD-10-CM

## 2014-05-15 DIAGNOSIS — Z7901 Long term (current) use of anticoagulants: Secondary | ICD-10-CM

## 2014-05-15 DIAGNOSIS — I4891 Unspecified atrial fibrillation: Secondary | ICD-10-CM

## 2014-05-15 LAB — POCT INR: INR: 2.3

## 2014-06-12 ENCOUNTER — Ambulatory Visit (INDEPENDENT_AMBULATORY_CARE_PROVIDER_SITE_OTHER): Payer: PRIVATE HEALTH INSURANCE | Admitting: Pharmacist Clinician (PhC)/ Clinical Pharmacy Specialist

## 2014-06-12 DIAGNOSIS — I482 Chronic atrial fibrillation, unspecified: Secondary | ICD-10-CM

## 2014-06-12 DIAGNOSIS — Z7901 Long term (current) use of anticoagulants: Secondary | ICD-10-CM

## 2014-06-12 LAB — POCT INR: INR: 3.6

## 2014-06-19 ENCOUNTER — Telehealth (HOSPITAL_COMMUNITY): Payer: Self-pay | Admitting: *Deleted

## 2014-06-28 ENCOUNTER — Ambulatory Visit (INDEPENDENT_AMBULATORY_CARE_PROVIDER_SITE_OTHER): Payer: PRIVATE HEALTH INSURANCE | Admitting: Pharmacist Clinician (PhC)/ Clinical Pharmacy Specialist

## 2014-06-28 VITALS — BP 126/80 | HR 60

## 2014-06-28 DIAGNOSIS — I482 Chronic atrial fibrillation, unspecified: Secondary | ICD-10-CM

## 2014-06-28 DIAGNOSIS — Z7901 Long term (current) use of anticoagulants: Secondary | ICD-10-CM

## 2014-06-28 LAB — POCT INR: INR: 1.9

## 2014-07-12 ENCOUNTER — Ambulatory Visit (INDEPENDENT_AMBULATORY_CARE_PROVIDER_SITE_OTHER): Payer: PRIVATE HEALTH INSURANCE | Admitting: Pharmacist Clinician (PhC)/ Clinical Pharmacy Specialist

## 2014-07-12 DIAGNOSIS — I482 Chronic atrial fibrillation, unspecified: Secondary | ICD-10-CM

## 2014-07-12 DIAGNOSIS — Z7901 Long term (current) use of anticoagulants: Secondary | ICD-10-CM

## 2014-07-12 LAB — POCT INR: INR: 2

## 2014-07-16 ENCOUNTER — Ambulatory Visit (HOSPITAL_COMMUNITY)
Admission: RE | Admit: 2014-07-16 | Discharge: 2014-07-16 | Disposition: A | Discharge status: Home / Self Care | Payer: PRIVATE HEALTH INSURANCE | Source: Ambulatory Visit | Attending: Cardiovascular Disease | Admitting: Cardiovascular Disease

## 2014-07-16 DIAGNOSIS — I714 Abdominal aortic aneurysm, without rupture, unspecified: Secondary | ICD-10-CM

## 2014-07-16 NOTE — Progress Notes (Signed)
Abdominal Aortic Duplex Completed °Brianna L Mazza,RVT °

## 2014-07-23 ENCOUNTER — Telehealth: Payer: Self-pay | Admitting: Cardiovascular Disease

## 2014-07-23 DIAGNOSIS — I714 Abdominal aortic aneurysm, without rupture, unspecified: Secondary | ICD-10-CM

## 2014-07-23 NOTE — Telephone Encounter (Signed)
Pt is returning Barbara's call. He thinks that it may be in regards to a test that he just had done. Please call  Thanks

## 2014-07-23 NOTE — Telephone Encounter (Signed)
Routed to Barbara. 

## 2014-07-23 NOTE — Telephone Encounter (Signed)
Abdominal aortic doppler results called to patient.  Order placed for repeat in 6 months.  Patient voiced understanding.

## 2014-08-21 ENCOUNTER — Other Ambulatory Visit: Payer: Self-pay | Admitting: Pharmacist Clinician (PhC)/ Clinical Pharmacy Specialist

## 2014-08-21 ENCOUNTER — Ambulatory Visit (INDEPENDENT_AMBULATORY_CARE_PROVIDER_SITE_OTHER): Payer: PRIVATE HEALTH INSURANCE | Admitting: Pharmacist Clinician (PhC)/ Clinical Pharmacy Specialist

## 2014-08-21 DIAGNOSIS — I482 Chronic atrial fibrillation, unspecified: Secondary | ICD-10-CM

## 2014-08-21 DIAGNOSIS — Z7901 Long term (current) use of anticoagulants: Secondary | ICD-10-CM

## 2014-08-21 LAB — POCT INR: INR: 2.7

## 2014-08-21 MED ORDER — ROSUVASTATIN CALCIUM 10 MG PO TABS: 10.0000 mg | ORAL_TABLET | Freq: Every day | ORAL | Status: DC

## 2014-08-21 MED ORDER — WARFARIN SODIUM 5 MG PO TABS: ORAL_TABLET | ORAL | Status: DC

## 2014-10-02 ENCOUNTER — Ambulatory Visit (INDEPENDENT_AMBULATORY_CARE_PROVIDER_SITE_OTHER): Payer: Medicare Other | Admitting: Pharmacist Clinician (PhC)/ Clinical Pharmacy Specialist

## 2014-10-02 DIAGNOSIS — I482 Chronic atrial fibrillation, unspecified: Secondary | ICD-10-CM

## 2014-10-02 DIAGNOSIS — Z7901 Long term (current) use of anticoagulants: Secondary | ICD-10-CM

## 2014-10-02 LAB — POCT INR: INR: 2.1

## 2014-11-13 ENCOUNTER — Ambulatory Visit (INDEPENDENT_AMBULATORY_CARE_PROVIDER_SITE_OTHER): Payer: Medicare Other | Admitting: Pharmacist Clinician (PhC)/ Clinical Pharmacy Specialist

## 2014-11-13 DIAGNOSIS — I482 Chronic atrial fibrillation, unspecified: Secondary | ICD-10-CM

## 2014-11-13 DIAGNOSIS — Z7901 Long term (current) use of anticoagulants: Secondary | ICD-10-CM

## 2014-11-13 LAB — POCT INR: INR: 2.1

## 2014-12-25 ENCOUNTER — Ambulatory Visit: Payer: Medicare Other | Admitting: Pharmacist Clinician (PhC)/ Clinical Pharmacy Specialist

## 2015-01-02 ENCOUNTER — Ambulatory Visit (INDEPENDENT_AMBULATORY_CARE_PROVIDER_SITE_OTHER): Payer: Medicare Other | Admitting: Pharmacist Clinician (PhC)/ Clinical Pharmacy Specialist

## 2015-01-02 DIAGNOSIS — Z7901 Long term (current) use of anticoagulants: Secondary | ICD-10-CM

## 2015-01-02 DIAGNOSIS — I482 Chronic atrial fibrillation, unspecified: Secondary | ICD-10-CM

## 2015-01-02 LAB — POCT INR: INR: 2.1

## 2015-01-23 ENCOUNTER — Ambulatory Visit (INDEPENDENT_AMBULATORY_CARE_PROVIDER_SITE_OTHER): Payer: Medicare Other | Admitting: Pharmacist Clinician (PhC)/ Clinical Pharmacy Specialist

## 2015-01-23 DIAGNOSIS — Z7901 Long term (current) use of anticoagulants: Secondary | ICD-10-CM

## 2015-01-23 DIAGNOSIS — I482 Chronic atrial fibrillation, unspecified: Secondary | ICD-10-CM

## 2015-01-23 LAB — POCT INR: INR: 2.1

## 2015-02-18 ENCOUNTER — Other Ambulatory Visit: Payer: Self-pay | Admitting: Cardiovascular Disease

## 2015-02-18 NOTE — Telephone Encounter (Signed)
REFILL 

## 2015-03-05 ENCOUNTER — Ambulatory Visit (INDEPENDENT_AMBULATORY_CARE_PROVIDER_SITE_OTHER): Payer: Medicare Other | Admitting: Pharmacist Clinician (PhC)/ Clinical Pharmacy Specialist

## 2015-03-05 DIAGNOSIS — I482 Chronic atrial fibrillation, unspecified: Secondary | ICD-10-CM

## 2015-03-05 DIAGNOSIS — Z7901 Long term (current) use of anticoagulants: Secondary | ICD-10-CM | POA: Diagnosis not present

## 2015-03-05 LAB — POCT INR: INR: 2.3

## 2015-03-17 ENCOUNTER — Encounter: Payer: Self-pay | Admitting: Cardiovascular Disease

## 2015-04-14 ENCOUNTER — Ambulatory Visit (INDEPENDENT_AMBULATORY_CARE_PROVIDER_SITE_OTHER): Payer: Medicare Other | Admitting: Pharmacist Clinician (PhC)/ Clinical Pharmacy Specialist

## 2015-04-14 DIAGNOSIS — I482 Chronic atrial fibrillation, unspecified: Secondary | ICD-10-CM

## 2015-04-14 DIAGNOSIS — Z7901 Long term (current) use of anticoagulants: Secondary | ICD-10-CM | POA: Diagnosis not present

## 2015-04-14 LAB — POCT INR: INR: 2.5

## 2015-04-16 ENCOUNTER — Ambulatory Visit: Payer: Medicare Other | Admitting: Pharmacist Clinician (PhC)/ Clinical Pharmacy Specialist

## 2015-05-15 ENCOUNTER — Emergency Department (HOSPITAL_BASED_OUTPATIENT_CLINIC_OR_DEPARTMENT_OTHER): Payer: Medicare Other

## 2015-05-15 ENCOUNTER — Encounter (HOSPITAL_BASED_OUTPATIENT_CLINIC_OR_DEPARTMENT_OTHER): Payer: Self-pay | Admitting: *Deleted

## 2015-05-15 ENCOUNTER — Emergency Department (HOSPITAL_BASED_OUTPATIENT_CLINIC_OR_DEPARTMENT_OTHER)
Admission: EM | Admit: 2015-05-15 | Discharge: 2015-05-15 | Disposition: A | Discharge status: Home / Self Care | Payer: Medicare Other | Attending: Emergency Medicine | Admitting: Emergency Medicine

## 2015-05-15 DIAGNOSIS — I2510 Atherosclerotic heart disease of native coronary artery without angina pectoris: Secondary | ICD-10-CM | POA: Diagnosis not present

## 2015-05-15 DIAGNOSIS — Z7901 Long term (current) use of anticoagulants: Secondary | ICD-10-CM | POA: Diagnosis not present

## 2015-05-15 DIAGNOSIS — Z8546 Personal history of malignant neoplasm of prostate: Secondary | ICD-10-CM | POA: Insufficient documentation

## 2015-05-15 DIAGNOSIS — Z86010 Personal history of colonic polyps: Secondary | ICD-10-CM | POA: Diagnosis not present

## 2015-05-15 DIAGNOSIS — Z8719 Personal history of other diseases of the digestive system: Secondary | ICD-10-CM | POA: Insufficient documentation

## 2015-05-15 DIAGNOSIS — E785 Hyperlipidemia, unspecified: Secondary | ICD-10-CM | POA: Diagnosis not present

## 2015-05-15 DIAGNOSIS — Y998 Other external cause status: Secondary | ICD-10-CM | POA: Diagnosis not present

## 2015-05-15 DIAGNOSIS — Z79899 Other long term (current) drug therapy: Secondary | ICD-10-CM | POA: Diagnosis not present

## 2015-05-15 DIAGNOSIS — Z8659 Personal history of other mental and behavioral disorders: Secondary | ICD-10-CM | POA: Diagnosis not present

## 2015-05-15 DIAGNOSIS — Y9389 Activity, other specified: Secondary | ICD-10-CM | POA: Diagnosis not present

## 2015-05-15 DIAGNOSIS — S7011XA Contusion of right thigh, initial encounter: Secondary | ICD-10-CM | POA: Diagnosis not present

## 2015-05-15 DIAGNOSIS — Y9289 Other specified places as the place of occurrence of the external cause: Secondary | ICD-10-CM | POA: Insufficient documentation

## 2015-05-15 DIAGNOSIS — Z872 Personal history of diseases of the skin and subcutaneous tissue: Secondary | ICD-10-CM | POA: Diagnosis not present

## 2015-05-15 DIAGNOSIS — W010XXA Fall on same level from slipping, tripping and stumbling without subsequent striking against object, initial encounter: Secondary | ICD-10-CM | POA: Insufficient documentation

## 2015-05-15 DIAGNOSIS — S79921A Unspecified injury of right thigh, initial encounter: Secondary | ICD-10-CM | POA: Diagnosis present

## 2015-05-15 LAB — PROTIME-INR
INR: 2.84 — AB (ref 0.00–1.49)
PROTHROMBIN TIME: 29.4 s — AB (ref 11.6–15.2)

## 2015-05-15 NOTE — ED Provider Notes (Signed)
CSN: [id number]     Arrival date & time 05/15/15  1012 History   First MD Initiated Contact with Patient 05/15/15 1023     Chief Complaint  Patient presents with  . Leg Pain     (Consider location/radiation/quality/duration/timing/severity/associated sxs/prior Treatment) HPI  Pt presenting with c/o pain in back of his right femur/thigh area.  He hit this area after a trip and fall on his patio- this happened approx 1 week ago. He takes coumadin.  He noted some bruising and this has spread down to behind his knee.  Pain has been getting worse.  He can bear weight but with some pain.  No swelling of lower extremity or ankles.  No other areas of pain or injury.  Pain is worse with palpation and bearing weight, feels better with rest.  There are no other associated systemic symptoms, there are no other alleviating or modifying factors.   Past Medical History  Diagnosis Date  . ASHD (arteriosclerotic heart disease) 1984  . Cancer 1999    PROSTATE  . Personal history of colonic polyps   . Dyslipidemia   . AAA (abdominal aortic aneurysm)   . Diverticulosis   . Actinic keratoses   . Anxiety    Past Surgical History  Procedure Laterality Date  . Odontoid fracture surgery      Odontoid screw fixation of type 2 odontoid fracture, open reduction and internal fixation of the fracture--patient had fallen down the stairs at 81 years old  . Other surgical history  01/29/1998    Stage TIC adenocarcinoma of the prostate--Surgeon--David, Grapey M.D.--Interstital seed implantation with I-125 seeds--PSA of approximately 7   Family History  Problem Relation Age of Onset  . Arthritis Mother   . Heart disease Father    Social History  Substance Use Topics  . Smoking status: Never Smoker   . Smokeless tobacco: Never Used  . Alcohol Use: No    Review of Systems  ROS reviewed and all otherwise negative except for mentioned in HPI    Allergies  Asa buff (mag and Zetia  Home Medications    Prior to Admission medications   Medication Sig Start Date End Date Taking? Authorizing Tranisha Tissue  [facility] Liver Oil 1250-135 UNITS CAPS Take 2 capsules by mouth daily.     Historical Daishaun Ayre, MD  metoprolol tartrate (LOPRESSOR) 25 MG tablet TAKE ONE-HALF (1/2) TABLET (12.5 MG) TWICE A DAY 02/18/15   [name] [name], MD  Multiple Vitamin (MULITIVITAMIN WITH MINERALS) TABS Take 1 tablet by mouth daily.    Historical Ronnald Shedden, MD  rosuvastatin (CRESTOR) 10 MG tablet Take 1 tablet (10 mg total) by mouth daily. 08/21/14   [name] [name], MD  warfarin (COUMADIN) 5 MG tablet TAKE 1 TABLET DAILY OR AS DIRECTED BY COUMADIN CLINIC 08/21/14   [name] [name], MD   BP 122/68 mmHg  Pulse 69  Temp(Src) 98 F (36.7 C) (Oral)  Resp 18  Ht 5\' 8"  (1.727 m)  Wt 150 lb (68.04 kg)  BMI 22.81 kg/m2  SpO2 98%  Vitals reviewed Physical Exam  Physical Examination: General appearance - alert, well appearing, and in no distress Mental status - alert, oriented to person, place, and time Eyes - no conjunctival injection, no scleral icterus Chest - clear to auscultation, no wheezes, rales or rhonchi, symmetric air entry Heart - normal rate, regular rhythm, normal S1, S2, no murmurs, rubs, clicks or gallops Neurological - alert, oriented, normal speech, no focal findings or movement disorder noted Musculoskeletal - no joint tenderness,  deformity or swelling, contusion overlying posterior right thigh with dependent bruising around lower posterior thigh, no warmth or fluctuance or induration overlying, no calf tenderness Extremities - peripheral pulses normal, no pedal edema, no clubbing or cyanosis Skin - normal coloration and turgor, no rashes, contusion approx 6cm x 6cm overlying posterior right thigh  ED Course  Procedures (including critical care time) Labs Review Labs Reviewed  PROTIME-INR - Abnormal; Notable for the following:    Prothrombin Time 29.4 (*)    INR 2.84 (*)    All other components within normal  limits    Imaging Review Dg Femur, Min 2 Views Right  05/15/2015   CLINICAL DATA:  Posterior mid right upper leg pain and bruising after a fall 1 week ago.  EXAM: RIGHT FEMUR 2 VIEWS  COMPARISON:  None.  FINDINGS: Prostate radiation seed implants. Atheromatous arterial calcifications. Right knee degenerative changes. Mild right hip degenerative changes. No fracture or dislocation seen.  IMPRESSION: 1. No fracture or dislocation. 2. Moderate to marked right knee degenerative changes. 3. Mild right hip degenerative changes.   Electronically Signed   By: Claudie Revering M.D.   On: 05/15/2015 12:14   I have personally reviewed and evaluated these images and lab results as part of my medical decision-making.   EKG Interpretation None      MDM   Final diagnoses:  Contusion of thigh, right, initial encounter    Pt presenting with pain in thigh after fall several days ago, contusion present on exam.  INR is therapeutic at 2.8.  Advised supportive care for contusion- discussed warning symptoms and signs of possible DVT, however do not think this is likely now given PE findings and therapeutic INR on coumadin.  Discharged with strict return precautions.  Pt agreeable with plan.    Alfonzo Beers, MD 05/15/15 [phone]

## 2015-05-15 NOTE — ED Notes (Signed)
Pt reports tripping on his steps, denies fall, "I just kind of stumbled a bit and hit my leg on the railing". Pt states he has been doing his normal adl's since then, denies any other injuries or c/o, pt states it got more sore yesterday. Contusions noted to right posterior thigh area, slightly tender to touch.

## 2015-05-15 NOTE — Discharge Instructions (Signed)
Return to the ED with any concerns including weakness of leg, swelling of leg, chest pain or shortness of breath, decreased level of alertness/lethargy, or any other alarming symptoms

## 2015-05-19 ENCOUNTER — Ambulatory Visit (INDEPENDENT_AMBULATORY_CARE_PROVIDER_SITE_OTHER): Payer: Medicare Other | Admitting: Family Medicine

## 2015-05-19 VITALS — BP 120/70 | HR 78 | Wt 156.0 lb

## 2015-05-19 DIAGNOSIS — I482 Chronic atrial fibrillation, unspecified: Secondary | ICD-10-CM

## 2015-05-19 DIAGNOSIS — Z7901 Long term (current) use of anticoagulants: Secondary | ICD-10-CM

## 2015-05-19 DIAGNOSIS — Z23 Encounter for immunization: Secondary | ICD-10-CM

## 2015-05-19 DIAGNOSIS — S7011XA Contusion of right thigh, initial encounter: Secondary | ICD-10-CM | POA: Diagnosis not present

## 2015-05-19 NOTE — Patient Instructions (Signed)
Heat for 20 minutes,three times a day.Stay active

## 2015-05-19 NOTE — Progress Notes (Signed)
   Subjective:    Patient ID: Gary Valencia, male    DOB: 1923-02-27, 81 y.o.   MRN: [id number]  HPI He fell on 9/22 well at home. He tripped on some steps and fell and injured his right posterolateral fiery. He was seen in the emergency room. The x-ray is negative. He is here for follow-up visit. He has not had any other falls. They do have night lights present. They do have railing present in the bathrooms and going up and down the stairs to get out of the house. They do not have any rugs in their house.he and his wife walk regularly. He continues on anticoagulation for his underlying atrial fib.  Review of Systems     Objective:   Physical Exam Swelling and ecchymosis is noted in the right thigh area. Good motion of the hip.       Assessment & Plan:  Thigh contusion, right, initial encounter  Need for prophylactic vaccination against Streptococcus pneumoniae (pneumococcus) - Plan: Pneumococcal conjugate vaccine 13-valent  Need for prophylactic vaccination and inoculation against influenza - Plan: Flu vaccine HIGH DOSE PF (Fluzone High dose)  Atrial fibrillation, permanent  Chronic anticoagulation -Warfarin  Recommend heat for 20 minutes 3 times per day. He is to keep his active as he can. Explained that physical activity will help this heal quicker. Also explained that he will see discoloration in his lower extremity. He does seem to have a good idea in terms of eliminating or minimizing falls.

## 2015-05-26 ENCOUNTER — Ambulatory Visit (INDEPENDENT_AMBULATORY_CARE_PROVIDER_SITE_OTHER): Payer: Medicare Other | Admitting: Pharmacist Clinician (PhC)/ Clinical Pharmacy Specialist

## 2015-05-26 DIAGNOSIS — I482 Chronic atrial fibrillation, unspecified: Secondary | ICD-10-CM

## 2015-05-26 DIAGNOSIS — Z7901 Long term (current) use of anticoagulants: Secondary | ICD-10-CM

## 2015-05-26 LAB — POCT INR: INR: 2.1

## 2015-05-29 ENCOUNTER — Other Ambulatory Visit: Payer: Self-pay | Admitting: *Deleted

## 2015-05-29 MED ORDER — METOPROLOL TARTRATE 25 MG PO TABS: 12.5000 mg | ORAL_TABLET | Freq: Two times a day (BID) | ORAL | Status: DC

## 2015-07-07 ENCOUNTER — Ambulatory Visit (INDEPENDENT_AMBULATORY_CARE_PROVIDER_SITE_OTHER): Payer: Medicare Other | Admitting: Pharmacist Clinician (PhC)/ Clinical Pharmacy Specialist

## 2015-07-07 DIAGNOSIS — Z7901 Long term (current) use of anticoagulants: Secondary | ICD-10-CM | POA: Diagnosis not present

## 2015-07-07 DIAGNOSIS — I482 Chronic atrial fibrillation, unspecified: Secondary | ICD-10-CM

## 2015-07-07 LAB — POCT INR: INR: 2

## 2015-07-11 ENCOUNTER — Emergency Department (HOSPITAL_BASED_OUTPATIENT_CLINIC_OR_DEPARTMENT_OTHER)
Admission: EM | Admit: 2015-07-11 | Discharge: 2015-07-11 | Disposition: A | Discharge status: Home / Self Care | Payer: Medicare Other | Attending: Emergency Medicine | Admitting: Emergency Medicine

## 2015-07-11 ENCOUNTER — Encounter (HOSPITAL_BASED_OUTPATIENT_CLINIC_OR_DEPARTMENT_OTHER): Payer: Self-pay | Admitting: *Deleted

## 2015-07-11 DIAGNOSIS — I4891 Unspecified atrial fibrillation: Secondary | ICD-10-CM | POA: Diagnosis not present

## 2015-07-11 DIAGNOSIS — Z872 Personal history of diseases of the skin and subcutaneous tissue: Secondary | ICD-10-CM | POA: Diagnosis not present

## 2015-07-11 DIAGNOSIS — Z8546 Personal history of malignant neoplasm of prostate: Secondary | ICD-10-CM | POA: Insufficient documentation

## 2015-07-11 DIAGNOSIS — I2510 Atherosclerotic heart disease of native coronary artery without angina pectoris: Secondary | ICD-10-CM | POA: Diagnosis not present

## 2015-07-11 DIAGNOSIS — J069 Acute upper respiratory infection, unspecified: Secondary | ICD-10-CM | POA: Diagnosis not present

## 2015-07-11 DIAGNOSIS — Z86010 Personal history of colonic polyps: Secondary | ICD-10-CM | POA: Diagnosis not present

## 2015-07-11 DIAGNOSIS — Z8659 Personal history of other mental and behavioral disorders: Secondary | ICD-10-CM | POA: Insufficient documentation

## 2015-07-11 DIAGNOSIS — Z79899 Other long term (current) drug therapy: Secondary | ICD-10-CM | POA: Diagnosis not present

## 2015-07-11 DIAGNOSIS — E785 Hyperlipidemia, unspecified: Secondary | ICD-10-CM | POA: Insufficient documentation

## 2015-07-11 DIAGNOSIS — R490 Dysphonia: Secondary | ICD-10-CM | POA: Diagnosis present

## 2015-07-11 DIAGNOSIS — Z7901 Long term (current) use of anticoagulants: Secondary | ICD-10-CM | POA: Insufficient documentation

## 2015-07-11 HISTORY — DX: Unspecified atrial fibrillation: I48.91

## 2015-07-11 LAB — BASIC METABOLIC PANEL
ANION GAP: 6 (ref 5–15)
BUN: 27 mg/dL — ABNORMAL HIGH (ref 6–20)
CALCIUM: 8.8 mg/dL — AB (ref 8.9–10.3)
CHLORIDE: 107 mmol/L (ref 101–111)
CO2: 25 mmol/L (ref 22–32)
CREATININE: 1.36 mg/dL — AB (ref 0.61–1.24)
GFR calc non Af Amer: 44 mL/min — ABNORMAL LOW (ref >60)
GFR, EST AFRICAN AMERICAN: 51 mL/min — AB (ref >60)
Glucose, Bld: 95 mg/dL (ref 65–99)
Potassium: 4.3 mmol/L (ref 3.5–5.1)
SODIUM: 138 mmol/L (ref 135–145)

## 2015-07-11 LAB — CBC WITH DIFFERENTIAL/PLATELET
BASOS ABS: 0 10*3/uL (ref 0.0–0.1)
BASOS PCT: 0 %
EOS ABS: 0.4 10*3/uL (ref 0.0–0.7)
Eosinophils Relative: 8 %
HEMATOCRIT: 36.6 % — AB (ref 39.0–52.0)
HEMOGLOBIN: 11.4 g/dL — AB (ref 13.0–17.0)
Lymphocytes Relative: 30 %
Lymphs Abs: 1.4 10*3/uL (ref 0.7–4.0)
MCH: 30.9 pg (ref 26.0–34.0)
MCHC: 31.1 g/dL (ref 30.0–36.0)
MCV: 99.2 fL (ref 78.0–100.0)
Monocytes Absolute: 0.5 10*3/uL (ref 0.1–1.0)
Monocytes Relative: 11 %
NEUTROS ABS: 2.5 10*3/uL (ref 1.7–7.7)
NEUTROS PCT: 51 %
Platelets: 125 10*3/uL — ABNORMAL LOW (ref 150–400)
RBC: 3.69 MIL/uL — ABNORMAL LOW (ref 4.22–5.81)
RDW: 15.5 % (ref 11.5–15.5)
WBC: 4.9 10*3/uL (ref 4.0–10.5)

## 2015-07-11 LAB — TROPONIN I

## 2015-07-11 NOTE — ED Provider Notes (Signed)
CSN: [id number]     Arrival date & time 07/11/15  0348 History   First MD Initiated Contact with Patient 07/11/15 0402     Chief Complaint  Patient presents with  . URI     (Consider location/radiation/quality/duration/timing/severity/associated sxs/prior Treatment) HPI  This is a 81 year old male with a history of atrial fibrillation. He began feeling sick yesterday. By this he means general malaise along with scratchy throat. He woke this morning about 3 AM with the sensation that his heart was racing which was accompanied by worsened general malaise. This did not last long and now he complains only of a mildly hoarse voice and feeling like his face is flushed. He denies nasal congestion, chills, nausea, vomiting, diarrhea, cough, shortness of breath or chest pain. He has taken an over-the-counter cold medication which contains acetaminophen and chlorpheniramine.  Past Medical History  Diagnosis Date  . ASHD (arteriosclerotic heart disease) 1984  . Cancer ([place]) 1999    PROSTATE  . Personal history of colonic polyps   . Dyslipidemia   . AAA (abdominal aortic aneurysm) ([place])   . Diverticulosis   . Actinic keratoses   . Anxiety   . Atrial fibrillation [facility])    Past Surgical History  Procedure Laterality Date  . Odontoid fracture surgery      Odontoid screw fixation of type 2 odontoid fracture, open reduction and internal fixation of the fracture--patient had fallen down the stairs at 81 years old  . Other surgical history  01/29/1998    Stage TIC adenocarcinoma of the prostate--Surgeon--David, Grapey M.D.--Interstital seed implantation with I-125 seeds--PSA of approximately 7   Family History  Problem Relation Age of Onset  . Arthritis Mother   . Heart disease Father    Social History  Substance Use Topics  . Smoking status: Never Smoker   . Smokeless tobacco: Never Used  . Alcohol Use: No    Review of Systems  All other systems reviewed and are negative.   Allergies   Asa buff (mag and Zetia  Home Medications   Prior to Admission medications   Medication Sig Start Date End Date Taking? Authorizing Provider  [facility] Liver Oil 1250-135 UNITS CAPS Take 2 capsules by mouth daily.     Historical Provider, MD  metoprolol tartrate (LOPRESSOR) 25 MG tablet Take 0.5 tablets (12.5 mg total) by mouth 2 (two) times daily. 05/29/15   [name] [name], MD  Multiple Vitamin (MULITIVITAMIN WITH MINERALS) TABS Take 1 tablet by mouth daily.    Historical Provider, MD  rosuvastatin (CRESTOR) 10 MG tablet Take 1 tablet (10 mg total) by mouth daily. 08/21/14   [name] [name], MD  warfarin (COUMADIN) 5 MG tablet TAKE 1 TABLET DAILY OR AS DIRECTED BY COUMADIN CLINIC 08/21/14   [name] [name], MD   BP 166/94 mmHg  Pulse 68  Temp(Src) 98.5 F (36.9 C) (Oral)  Resp 16  SpO2 97%   Physical Exam  General: Well-developed, well-nourished male in no acute distress; appearance consistent with age of record HENT: normocephalic; atraumatic; no nasal congestion; mild dysarthria; pharynx normal Eyes: pupils equal, round and reactive to light; extraocular muscles intact; arcus senilis bilaterally Neck: supple Heart: Mildly irregular rhythm Lungs: clear to auscultation bilaterally Abdomen: soft; nondistended; nontender; pulsatile mass left upper quadrant without tenderness consistent with patient's known AAA; bowel sounds present Extremities: No deformity; full range of motion; pulses normal Neurologic: Awake, alert and oriented; motor function intact in all extremities and symmetric; no facial droop Skin: Warm and dry Psychiatric: Normal mood  and affect    ED Course  Procedures (including critical care time)   EKG Interpretation   Date/Time:  Friday July 11 2015 04:08:41 EST Ventricular Rate:  66 PR Interval:    QRS Duration: 90 QT Interval:  454 QTC Calculation: 475 R Axis:   -26 Text Interpretation:  Atrial fibrillation Septal infarct , age  undetermined Abnormal ECG  No significant change was found Confirmed by  Florina Ou  MD, Jenny Reichmann ([id number]) on 07/11/2015 4:12:52 AM      MDM  Nursing notes and vitals signs, including pulse oximetry, reviewed.  Summary of this visit's results, reviewed by myself:  Labs:  Results for orders placed or performed during the hospital encounter of 07/11/15 (from the past 24 hour(s))  Troponin I     Status: None   Collection Time: 07/11/15  4:20 AM  Result Value Ref Range   Troponin I <0.03 <0.031 ng/mL  CBC with Differential/Platelet     Status: Abnormal   Collection Time: 07/11/15  4:20 AM  Result Value Ref Range   WBC 4.9 4.0 - 10.5 K/uL   RBC 3.69 (L) 4.22 - 5.81 MIL/uL   Hemoglobin 11.4 (L) 13.0 - 17.0 g/dL   HCT 36.6 (L) 39.0 - 52.0 %   MCV 99.2 78.0 - 100.0 fL   MCH 30.9 26.0 - 34.0 pg   MCHC 31.1 30.0 - 36.0 g/dL   RDW 15.5 11.5 - 15.5 %   Platelets 125 (L) 150 - 400 K/uL   Neutrophils Relative % 51 %   Neutro Abs 2.5 1.7 - 7.7 K/uL   Lymphocytes Relative 30 %   Lymphs Abs 1.4 0.7 - 4.0 K/uL   Monocytes Relative 11 %   Monocytes Absolute 0.5 0.1 - 1.0 K/uL   Eosinophils Relative 8 %   Eosinophils Absolute 0.4 0.0 - 0.7 K/uL   Basophils Relative 0 %   Basophils Absolute 0.0 0.0 - 0.1 K/uL  Basic metabolic panel     Status: Abnormal   Collection Time: 07/11/15  4:20 AM  Result Value Ref Range   Sodium 138 135 - 145 mmol/L   Potassium 4.3 3.5 - 5.1 mmol/L   Chloride 107 101 - 111 mmol/L   CO2 25 22 - 32 mmol/L   Glucose, Bld 95 65 - 99 mg/dL   BUN 27 (H) 6 - 20 mg/dL   Creatinine, Ser 1.36 (H) 0.61 - 1.24 mg/dL   Calcium 8.8 (L) 8.9 - 10.3 mg/dL   GFR calc non Af Amer 44 (L) >60 mL/min   GFR calc Af Amer 51 (L) >60 mL/min   Anion gap 6 5 - 15   5:10 AM Patient feeling better. No tachyarrhythmias seen on monitor.     Shanon Rosser, MD 07/11/15 [phone]

## 2015-07-11 NOTE — ED Notes (Addendum)
Pt  States that he felt his heart was racing when he woke up, feels like he is getting a cold scratchy throat denies nasal congestion denies chest pain or SHOB. Also states he feels flushed

## 2015-07-28 ENCOUNTER — Ambulatory Visit (INDEPENDENT_AMBULATORY_CARE_PROVIDER_SITE_OTHER): Payer: Medicare Other | Admitting: Cardiovascular Disease

## 2015-07-28 VITALS — BP 156/92 | HR 62 | Ht 67.0 in | Wt 148.0 lb

## 2015-07-28 DIAGNOSIS — I482 Chronic atrial fibrillation, unspecified: Secondary | ICD-10-CM

## 2015-07-28 DIAGNOSIS — Z7901 Long term (current) use of anticoagulants: Secondary | ICD-10-CM

## 2015-07-28 DIAGNOSIS — I714 Abdominal aortic aneurysm, without rupture, unspecified: Secondary | ICD-10-CM

## 2015-07-28 DIAGNOSIS — I10 Essential (primary) hypertension: Secondary | ICD-10-CM | POA: Diagnosis not present

## 2015-07-28 DIAGNOSIS — I2510 Atherosclerotic heart disease of native coronary artery without angina pectoris: Secondary | ICD-10-CM

## 2015-07-28 DIAGNOSIS — E785 Hyperlipidemia, unspecified: Secondary | ICD-10-CM

## 2015-07-28 DIAGNOSIS — Z79899 Other long term (current) drug therapy: Secondary | ICD-10-CM

## 2015-07-28 NOTE — Progress Notes (Signed)
Patient ID: RAVINDER LAMKIN, male   DOB: March 13, 1923, 81 y.o.   MRN: [id number]      Cardiology Office Note   Date:  07/30/2015   ID:  DEMETRICK TIREY, DOB [birth year], MRN [id number]  PCP:  Wyatt Haste, MD  Cardiologist:   Sanda Klein, MD   Chief Complaint  Patient presents with  . Follow-up    pt states no chest pain no SOB no light headedness or dizziness, but states a little puffiness on top of right foot      History of Present Illness: KALIF KENTNER is a 81 y.o. male who presents for  Follow-up on CAD now 33 years status post coronary artery bypass surgery,  Small abdominal aortic aneurysm , permanent atrial fibrillation , essential hypertension.  He feels great and has no cardiac complaints. His blood pressure was a little high today but he checks it with his home blood pressure monitor frequently and it is consistently in the mid 120s/low 70s range.  Indeed, in September when he saw Dr. Redmond School his blood pressure was 120/70. He has been taking his medicines without fail and is only on a very low dose of metoprolol for blood pressure. He has not had any bleeding problems related to chronic warfarin anticoagulation and denies any focal neurological events. Denies angina pectoris or exertional dyspnea. As always his right foot is occasionally edematous towards the end of the day ( right leg venous harvest for bypass). He denies palpitations, syncope , dizziness or lightheadedness. He has not had any falls.    Past Medical History  Diagnosis Date  . ASHD (arteriosclerotic heart disease) 1984  . Cancer ([place]) 1999    PROSTATE  . Personal history of colonic polyps   . Dyslipidemia   . AAA (abdominal aortic aneurysm) ([place])   . Diverticulosis   . Actinic keratoses   . Anxiety   . Atrial fibrillation [facility])     Past Surgical History  Procedure Laterality Date  . Odontoid fracture surgery      Odontoid screw fixation of type 2 odontoid fracture, open reduction  and internal fixation of the fracture--patient had fallen down the stairs at 81 years old  . Other surgical history  01/29/1998    Stage TIC adenocarcinoma of the prostate--Surgeon--David, Grapey M.D.--Interstital seed implantation with I-125 seeds--PSA of approximately 7     Current Outpatient Prescriptions  Medication Sig Dispense Refill  . Cod Liver Oil 1250-135 UNITS CAPS Take 2 capsules by mouth daily.     . metoprolol tartrate (LOPRESSOR) 25 MG tablet Take 0.5 tablets (12.5 mg total) by mouth 2 (two) times daily. 90 tablet 2  . Multiple Vitamin (MULITIVITAMIN WITH MINERALS) TABS Take 1 tablet by mouth daily.    . rosuvastatin (CRESTOR) 10 MG tablet Take 1 tablet (10 mg total) by mouth daily. 90 tablet 2  . warfarin (COUMADIN) 5 MG tablet TAKE 1 TABLET DAILY OR AS DIRECTED BY COUMADIN CLINIC 90 tablet 1   No current facility-administered medications for this visit.    Allergies:   Asa buff (mag and Zetia    Social History:  The patient  reports that he has never smoked. He has never used smokeless tobacco. He reports that he does not drink alcohol or use illicit drugs.   Family History:  The patient's family history includes Arthritis in his mother; Heart disease in his father.    ROS:  Please see the history of present illness.    Otherwise, review of systems positive  for none.   All other systems are reviewed and negative.    PHYSICAL EXAM: VS:  BP 156/92 mmHg  Pulse 62  Ht 5\' 7"  (1.702 m)  Wt 148 lb (67.132 kg)  BMI 23.17 kg/m2 , BMI Body mass index is 23.17 kg/(m^2).  General: Alert, oriented x3, no distress Head: no evidence of trauma, PERRL, EOMI, no exophtalmos or lid lag, no myxedema, no xanthelasma; normal ears, nose and oropharynx Neck: normal jugular venous pulsations and no hepatojugular reflux; brisk carotid pulses without delay and no carotid bruits Chest: clear to auscultation, no signs of consolidation by percussion or palpation, normal fremitus, symmetrical  and full respiratory excursions Cardiovascular: normal position and quality of the apical impulse, irregular rhythm, normal first and second heart sounds, no murmurs, rubs or gallops Abdomen: no tenderness or distention, no masses by palpation, no abnormal pulsatility or arterial bruits, normal bowel sounds, no hepatosplenomegaly Extremities: no clubbing, cyanosis or edema; 2+ radial, ulnar and brachial pulses bilaterally; 2+ right femoral, posterior tibial and dorsalis pedis pulses; 2+ left femoral, posterior tibial and dorsalis pedis pulses; no subclavian or femoral bruits Neurological: grossly nonfocal Psych: euthymic mood, full affect   EKG:  EKG is not ordered today. The ekg ordered 07/11/15 demonstrates atrial fibrillation and QS in V1-V2   Recent Labs: 07/11/2015: BUN 27*; Creatinine, Ser 1.36*; Hemoglobin 11.4*; Platelets 125*; Potassium 4.3; Sodium 138    Lipid Panel    Component Value Date/Time   CHOL 157 03/22/2014 0952   TRIG 109 03/22/2014 0952   HDL 59 03/22/2014 0952   CHOLHDL 2.7 03/22/2014 0952   VLDL 22 03/22/2014 0952   LDLCALC 76 03/22/2014 0952      Wt Readings from Last 3 Encounters:  07/28/15 148 lb (67.132 kg)  05/19/15 156 lb (70.761 kg)  05/15/15 150 lb (68.04 kg)      ASSESSMENT AND PLAN:  1.   Permanent atrial fibrillation with good ventricular rate control and uncomplicated warfarin anticoagulation. Despite his advanced age , he has not had any signs of being at particular risk for bleeding complications.  2. CAD s/p CABG with remarkably durable benefits, now more than 30 years after bypass he remains asymptomatic.  3.  Small abdominal aortic aneurysm -  He canceled his follow-up ultrasound due to his wife's health problems, need to reschedule.  Discussed the fact that he may be a candidate for stent-graft repair , even at his age  43.  Hyperlipidemia with good lipid parameters,  Time to recheck  5.  Hypertension, usually very well controlled ,  slightly high today. Bring his blood pressure monitor to his next Coumadin clinic appointment please   Current medicines are reviewed at length with the patient today.  The patient does not have concerns regarding medicines.  The following changes have been made:  no change  Labs/ tests ordered today include:  Orders Placed This Encounter  Procedures  . Comprehensive metabolic panel  . Lipid panel    Patient Instructions  Your physician has requested that you have an abdominal aorta duplex. Please call when you are ready to schedule. During this test, an ultrasound is used to evaluate the aorta. Allow 30 minutes for this exam. Do not eat after midnight the day before and avoid carbonated beverages.  Your physician recommends that you return for lab work in: AT [place].  BRING YOUR BP MACHINE TO YOUR NEXT  APPOINTMENT WITH KRISTIN.  [name] recommends that you schedule a follow-up  appointment in: ONE YEAR       SignedSanda Klein, MD  07/30/2015 1:11 PM    Sanda Klein, MD, [facility] [phone] office [phone] pager

## 2015-07-28 NOTE — Patient Instructions (Signed)
Your physician has requested that you have an abdominal aorta duplex. Please call when you are ready to schedule. During this test, an ultrasound is used to evaluate the aorta. Allow 30 minutes for this exam. Do not eat after midnight the day before and avoid carbonated beverages.  Your physician recommends that you return for lab work in: AT [place].  BRING YOUR BP MACHINE TO YOUR NEXT  APPOINTMENT WITH KRISTIN.  [name] recommends that you schedule a follow-up appointment in: [place]

## 2015-07-30 ENCOUNTER — Encounter: Payer: Self-pay | Admitting: Cardiovascular Disease

## 2015-08-08 ENCOUNTER — Ambulatory Visit (INDEPENDENT_AMBULATORY_CARE_PROVIDER_SITE_OTHER): Payer: Medicare Other | Admitting: Pharmacist Clinician (PhC)/ Clinical Pharmacy Specialist

## 2015-08-08 DIAGNOSIS — Z7901 Long term (current) use of anticoagulants: Secondary | ICD-10-CM

## 2015-08-08 DIAGNOSIS — I482 Chronic atrial fibrillation, unspecified: Secondary | ICD-10-CM

## 2015-08-08 LAB — POCT INR: INR: 2.6

## 2015-08-20 ENCOUNTER — Ambulatory Visit: Payer: Medicare Other | Admitting: Pharmacist Clinician (PhC)/ Clinical Pharmacy Specialist

## 2015-09-09 ENCOUNTER — Ambulatory Visit (HOSPITAL_COMMUNITY)
Admission: RE | Admit: 2015-09-09 | Discharge: 2015-09-09 | Disposition: A | Discharge status: Home / Self Care | Payer: Medicare Other | Source: Ambulatory Visit | Attending: Cardiovascular Disease | Admitting: Cardiovascular Disease

## 2015-09-09 DIAGNOSIS — I714 Abdominal aortic aneurysm, without rupture, unspecified: Secondary | ICD-10-CM

## 2015-09-10 ENCOUNTER — Telehealth: Payer: Self-pay | Admitting: *Deleted

## 2015-09-10 DIAGNOSIS — I714 Abdominal aortic aneurysm, without rupture, unspecified: Secondary | ICD-10-CM

## 2015-09-10 NOTE — Telephone Encounter (Signed)
Patient notified of results of Aorta Duplex.  Informed patient that Dr. Loletha Grayer wants him to see Dr. Trula Slade to discuss stent/graft repair of AAA .  Order placed.  Patient voiced understanding.

## 2015-09-10 NOTE — Telephone Encounter (Signed)
-----   Message from Sanda Klein, MD sent at 09/09/2015 10:37 AM EST ----- Regarding: RE: prelim Thanks. Pamala Hurry, Can you please tell Mr. Northrip that I would like him to see Dr. Annamarie Major to discuss possible stent-graft repair of his AAA? It is not quite yet big enough to require it, but it's time to have the discussion. Please make the referral.  Sanda Klein, MD ----- Message -----    From: Illene Silver    Sent: 09/09/2015   9:57 AM      To: [name] [name], MD Subject: prelim                                         Aorta Duplex showed the AAA measuring 4.3 cm x 5.2 cm.

## 2015-09-19 ENCOUNTER — Ambulatory Visit (INDEPENDENT_AMBULATORY_CARE_PROVIDER_SITE_OTHER): Payer: Medicare Other | Admitting: Pharmacist Clinician (PhC)/ Clinical Pharmacy Specialist

## 2015-09-19 DIAGNOSIS — I482 Chronic atrial fibrillation, unspecified: Secondary | ICD-10-CM

## 2015-09-19 DIAGNOSIS — Z7901 Long term (current) use of anticoagulants: Secondary | ICD-10-CM | POA: Diagnosis not present

## 2015-09-19 LAB — POCT INR: INR: 2.2

## 2015-09-22 ENCOUNTER — Encounter: Payer: Self-pay | Admitting: Surgery

## 2015-09-29 ENCOUNTER — Encounter: Payer: Self-pay | Admitting: Surgery

## 2015-09-29 ENCOUNTER — Ambulatory Visit (INDEPENDENT_AMBULATORY_CARE_PROVIDER_SITE_OTHER): Payer: Medicare Other | Admitting: Surgery

## 2015-09-29 VITALS — BP 145/88 | HR 64 | Ht 67.0 in | Wt 145.0 lb

## 2015-09-29 DIAGNOSIS — I714 Abdominal aortic aneurysm, without rupture, unspecified: Secondary | ICD-10-CM

## 2015-09-29 NOTE — Addendum Note (Signed)
Addended by: Dorthula Rue L on: 09/29/2015 01:25 PM   Modules accepted: Orders

## 2015-09-29 NOTE — Progress Notes (Signed)
Patient name: Gary Valencia MRN: [id number] DOB: 10/10/1922 Sex: male   Referred by: [name]  Reason for referral:  Chief Complaint  Patient presents with  . New Evaluation    eval AAA    HISTORY OF PRESENT ILLNESS: Is a very pleasant 82 year old gentleman who is referred for evaluation of an abdominal aortic aneurysm.  He comes with a recent ultrasound which shows progression of his aneurysm, now measuring 5.2 cm by ultrasound.  The patient has a history of CABG, 33 years ago.  He suffers from atrial fibrillation which is managed with anticoagulation.  He is medically managed for hypertension.  He takes a statin for hypercholesterolemia.  He is on single agent antiplatelet therapy he is a nonsmoker  Past Medical History  Diagnosis Date  . ASHD (arteriosclerotic heart disease) 1984  . Cancer ([place]) 1999    PROSTATE  . Personal history of colonic polyps   . Dyslipidemia   . AAA (abdominal aortic aneurysm) ([place])   . Diverticulosis   . Actinic keratoses   . Anxiety   . Atrial fibrillation [facility])     Past Surgical History  Procedure Laterality Date  . Odontoid fracture surgery      Odontoid screw fixation of type 2 odontoid fracture, open reduction and internal fixation of the fracture--patient had fallen down the stairs at 82 years old  . Other surgical history  01/29/1998    Stage TIC adenocarcinoma of the prostate--Surgeon--David, Grapey M.D.--Interstital seed implantation with I-125 seeds--PSA of approximately 7    Social History   Social History  . Marital Status: Married    Spouse Name: N/A  . Number of Children: N/A  . Years of Education: N/A   Occupational History  . Not on file.   Social History Main Topics  . Smoking status: Never Smoker   . Smokeless tobacco: Never Used  . Alcohol Use: No  . Drug Use: No  . Sexual Activity: Not on file   Other Topics Concern  . Not on file   Social History Narrative    Family History  Problem Relation  Age of Onset  . Arthritis Mother   . Heart disease Father   . AAA (abdominal aortic aneurysm) Brother     Allergies as of 09/29/2015 - Review Complete 09/29/2015  Allergen Reaction Noted  . Asa buff (mag [buffered aspirin]  06/23/2011  . Zetia [ezetimibe]  06/23/2011    Current Outpatient Prescriptions on File Prior to Visit  Medication Sig Dispense Refill  . Cod Liver Oil 1250-135 UNITS CAPS Take 2 capsules by mouth daily.     . metoprolol tartrate (LOPRESSOR) 25 MG tablet Take 0.5 tablets (12.5 mg total) by mouth 2 (two) times daily. 90 tablet 2  . Multiple Vitamin (MULITIVITAMIN WITH MINERALS) TABS Take 1 tablet by mouth daily.    . rosuvastatin (CRESTOR) 10 MG tablet Take 1 tablet (10 mg total) by mouth daily. 90 tablet 2  . warfarin (COUMADIN) 5 MG tablet TAKE 1 TABLET DAILY OR AS DIRECTED BY COUMADIN CLINIC 90 tablet 1   No current facility-administered medications on file prior to visit.     REVIEW OF SYSTEMS: Cardiovascular: No chest pain, chest pressure possible for irregular heartbeat. Pulmonary: No productive cough, asthma or wheezing. Neurologic: No weakness, paresthesias, aphasia, or amaurosis. No dizziness. Hematologic: No bleeding problems or clotting disorders. Musculoskeletal: No joint pain or joint swelling. Gastrointestinal: No blood in stool or hematemesis Genitourinary: No dysuria or hematuria. Psychiatric:: No history  of major depression. Integumentary: No rashes or ulcers. Constitutional: No fever or chills.  PHYSICAL EXAMINATION:  Filed Vitals:   09/29/15 0951 09/29/15 0958  BP: 146/80 145/88  Pulse: 64   Height: 5\' 7"  (1.702 m)   Weight: 145 lb (65.772 kg)   SpO2: 97%    Body mass index is 22.71 kg/(m^2). General: The patient appears their stated age.   HEENT:  No gross abnormalities Pulmonary: Respirations are non-labored Abdomen: Soft and non-tender .  Pulsatile mass, nontender Musculoskeletal: There are no major deformities.     Neurologic: No focal weakness or paresthesias are detected, Skin: There are no ulcer or rashes noted. Psychiatric: The patient has normal affect. Cardiovascular: There is a regular rate and rhythm without significant murmur appreciated.  Pedal pulses palpable on the left, not the right.  No carotid bruits  Diagnostic Studies: I have reviewed his outside ultrasound which shows a maximum diameter of 5.2 cm of his infrarenal abdominal aorta    Assessment:  Abdominal aortic aneurysm Plan: I discussed the ultrasound findings today with the patient.  I feel we need to get a better imaging evaluation of his aneurysm, to determine if he is a endovascular candidate.  In addition, I want an exact measurement of his aortic diameter.  This will be arranged within the next several weeks and the patient will follow up for further discussions.  He does tell me that he wants to live to be 89 or 89.  Therefore, strong consideration for repair will be discussed with the patient and his family when he returns.  I'm also getting carotid Doppler studies for preoperative purposes     V. Leia Alf, M.D. [facility] Office: [phone] Pager:  [phone]

## 2015-10-06 ENCOUNTER — Other Ambulatory Visit: Payer: Self-pay | Admitting: *Deleted

## 2015-10-06 ENCOUNTER — Ambulatory Visit (HOSPITAL_COMMUNITY)
Admission: RE | Admit: 2015-10-06 | Discharge: 2015-10-06 | Disposition: A | Discharge status: Home / Self Care | Payer: Medicare Other | Source: Ambulatory Visit | Attending: Surgery | Admitting: Surgery

## 2015-10-06 DIAGNOSIS — I714 Abdominal aortic aneurysm, without rupture, unspecified: Secondary | ICD-10-CM

## 2015-10-06 DIAGNOSIS — Z01812 Encounter for preprocedural laboratory examination: Secondary | ICD-10-CM

## 2015-10-06 DIAGNOSIS — I779 Disorder of arteries and arterioles, unspecified: Secondary | ICD-10-CM | POA: Diagnosis not present

## 2015-10-06 DIAGNOSIS — E785 Hyperlipidemia, unspecified: Secondary | ICD-10-CM | POA: Insufficient documentation

## 2015-10-06 DIAGNOSIS — I739 Peripheral vascular disease, unspecified: Secondary | ICD-10-CM

## 2015-10-07 ENCOUNTER — Ambulatory Visit
Admission: RE | Admit: 2015-10-07 | Discharge: 2015-10-07 | Disposition: A | Discharge status: Home / Self Care | Payer: Medicare Other | Source: Ambulatory Visit | Attending: Surgery | Admitting: Surgery

## 2015-10-07 DIAGNOSIS — I714 Abdominal aortic aneurysm, without rupture, unspecified: Secondary | ICD-10-CM

## 2015-10-07 MED ORDER — IOPAMIDOL (ISOVUE-370) INJECTION 76%
75.0000 mL | Freq: Once | INTRAVENOUS | Status: AC | PRN
Start: 2015-10-07 — End: 2015-10-07
  Administered 2015-10-07: 75 mL via INTRAVENOUS

## 2015-10-15 ENCOUNTER — Encounter: Payer: Self-pay | Admitting: Surgery

## 2015-10-20 ENCOUNTER — Ambulatory Visit (INDEPENDENT_AMBULATORY_CARE_PROVIDER_SITE_OTHER): Payer: Medicare Other | Admitting: Surgery

## 2015-10-20 ENCOUNTER — Encounter: Payer: Self-pay | Admitting: Surgery

## 2015-10-20 VITALS — BP 155/84 | HR 58 | Ht 67.0 in | Wt 149.3 lb

## 2015-10-20 DIAGNOSIS — I714 Abdominal aortic aneurysm, without rupture, unspecified: Secondary | ICD-10-CM

## 2015-10-20 NOTE — Progress Notes (Signed)
Patient name: Gary Valencia MRN: [id number] DOB: 05/23/1923 Sex: male     Chief Complaint  Patient presents with  . Re-evaluation    2 wk f/u CTA chest/abd/pelvis - carotid  done here 02/13    HISTORY OF PRESENT ILLNESS:  the patient is back today for follow-up of his abdominal aortic aneurysm which measures 5.2 cm by ultrasound.  He recently had a CT scan and is here to discuss this with his wife and son.  He has had no interval changes.  The patient has a history of CABG, 33 years ago. He suffers from atrial fibrillation which is managed with anticoagulation. He is medically managed for hypertension. He takes a statin for hypercholesterolemia. He is on single agent antiplatelet therapy he is a nonsmoker  Past Medical History  Diagnosis Date  . ASHD (arteriosclerotic heart disease) 1984  . Cancer ([place]) 1999    PROSTATE  . Personal history of colonic polyps   . Dyslipidemia   . AAA (abdominal aortic aneurysm) ([place])   . Diverticulosis   . Actinic keratoses   . Anxiety   . Atrial fibrillation [facility])     Past Surgical History  Procedure Laterality Date  . Odontoid fracture surgery      Odontoid screw fixation of type 2 odontoid fracture, open reduction and internal fixation of the fracture--patient had fallen down the stairs at 82 years old  . Other surgical history  01/29/1998    Stage TIC adenocarcinoma of the prostate--Surgeon--David, Grapey M.D.--Interstital seed implantation with I-125 seeds--PSA of approximately 7    Social History   Social History  . Marital Status: Married    Spouse Name: N/A  . Number of Children: N/A  . Years of Education: N/A   Occupational History  . Not on file.   Social History Main Topics  . Smoking status: Never Smoker   . Smokeless tobacco: Never Used  . Alcohol Use: No  . Drug Use: No  . Sexual Activity: Not on file   Other Topics Concern  . Not on file   Social History Narrative    Family History  Problem  Relation Age of Onset  . Arthritis Mother   . Heart disease Father   . AAA (abdominal aortic aneurysm) Brother     Allergies as of 10/20/2015 - Review Complete 10/20/2015  Allergen Reaction Noted  . Asa buff (mag [buffered aspirin]  06/23/2011  . Zetia [ezetimibe]  06/23/2011    Current Outpatient Prescriptions on File Prior to Visit  Medication Sig Dispense Refill  . Cod Liver Oil 1250-135 UNITS CAPS Take 2 capsules by mouth daily.     . metoprolol tartrate (LOPRESSOR) 25 MG tablet Take 0.5 tablets (12.5 mg total) by mouth 2 (two) times daily. 90 tablet 2  . Multiple Vitamin (MULITIVITAMIN WITH MINERALS) TABS Take 1 tablet by mouth daily.    . rosuvastatin (CRESTOR) 10 MG tablet Take 1 tablet (10 mg total) by mouth daily. 90 tablet 2  . warfarin (COUMADIN) 5 MG tablet TAKE 1 TABLET DAILY OR AS DIRECTED BY COUMADIN CLINIC 90 tablet 1   No current facility-administered medications on file prior to visit.     REVIEW OF SYSTEMS:  please see history of present illness.  There is been no interval changes since his last visit 2 weeks ago   PHYSICAL EXAMINATION:   Vital signs are  Filed Vitals:   10/20/15 1447 10/20/15 1448  BP: 161/86 155/84  Pulse: 58   Height:  5\' 7"  (1.702 m)   Weight: 149 lb 4.8 oz (67.722 kg)   SpO2: 98%    Body mass index is 23.38 kg/(m^2). General: The patient appears their stated age. HEENT:  No gross abnormalities Pulmonary:  Non labored breathing Abdomen: Soft and non-tender Musculoskeletal: There are no major deformities. Neurologic: No focal weakness or paresthesias are detected, Skin: There are no ulcer or rashes noted. Psychiatric: The patient has normal affect. Cardiovascular: There is a regular rate and rhythm without significant murmur appreciated.   Diagnostic Studies  I have reviewed his CT scan with the following findings: 1. 5.1 cm infrarenal abdominal aortic aneurysm. 2. 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 3. Four-chamber cardiac enlargement with suggestion of pulmonary hypertension. 4. Cholelithiasis 5. Sigmoid diverticulosis  The patient carotid Doppler studies today which were normal  Assessment: Abdominal aortic aneurysm Plan:  I had a lengthy discussion with the patient's wife and son regarding how to proceed with management of his aneurysm, given his age.  After well-informed conversation, we have collectively decided to proceed with endovascular repair.  I did discuss all the risks and benefits of surgery including the risk of death, cardiac  complications, renal ischemia, intestinal ischemia, lower extremity embolic disease. They are willing to proceed and have selected April 20 procedure surgery.  The patient will need to be off of his Coumadin for 5 days prior to his operation.  Eldridge Abrahams, M.D. [facility] [place] Office: [phone] Pager:  [phone]

## 2015-10-31 ENCOUNTER — Other Ambulatory Visit: Payer: Self-pay

## 2015-10-31 ENCOUNTER — Ambulatory Visit (INDEPENDENT_AMBULATORY_CARE_PROVIDER_SITE_OTHER): Payer: Medicare Other | Admitting: Pharmacist Clinician (PhC)/ Clinical Pharmacy Specialist

## 2015-10-31 DIAGNOSIS — I482 Chronic atrial fibrillation, unspecified: Secondary | ICD-10-CM

## 2015-10-31 DIAGNOSIS — Z7901 Long term (current) use of anticoagulants: Secondary | ICD-10-CM

## 2015-10-31 LAB — POCT INR: INR: 2.1

## 2015-11-05 ENCOUNTER — Telehealth: Payer: Self-pay | Admitting: Cardiovascular Disease

## 2015-11-05 ENCOUNTER — Encounter: Payer: Self-pay | Admitting: Cardiovascular Disease

## 2015-11-05 NOTE — Telephone Encounter (Signed)
Letter sent via epic 

## 2015-11-05 NOTE — Telephone Encounter (Signed)
Request for surgical clearance:  1. What type of surgery is being performed? EVAR   2. When is this surgery scheduled? April 20,2017  3. Are there any medications that need to be held prior to surgery and how long?Coumadin can he stop 5 days prior?   4. Name of physician performing surgery? Dr Harold Barban   5. What is your office phone and fax number? [phone] and Fax#-[phone]

## 2015-11-05 NOTE — Telephone Encounter (Signed)
Returned call.Vira Browns of sent clearance letter. No further needs at this time.

## 2015-12-03 ENCOUNTER — Encounter (HOSPITAL_COMMUNITY)
Admission: RE | Admit: 2015-12-03 | Discharge: 2015-12-03 | Disposition: A | Discharge status: Home / Self Care | Payer: Medicare Other | Source: Ambulatory Visit | Attending: Surgery | Admitting: Surgery

## 2015-12-03 ENCOUNTER — Encounter (HOSPITAL_COMMUNITY): Payer: Self-pay

## 2015-12-03 DIAGNOSIS — I714 Abdominal aortic aneurysm, without rupture: Secondary | ICD-10-CM | POA: Insufficient documentation

## 2015-12-03 DIAGNOSIS — I4891 Unspecified atrial fibrillation: Secondary | ICD-10-CM | POA: Diagnosis not present

## 2015-12-03 DIAGNOSIS — Z8546 Personal history of malignant neoplasm of prostate: Secondary | ICD-10-CM | POA: Insufficient documentation

## 2015-12-03 DIAGNOSIS — Z7901 Long term (current) use of anticoagulants: Secondary | ICD-10-CM | POA: Insufficient documentation

## 2015-12-03 DIAGNOSIS — Z01812 Encounter for preprocedural laboratory examination: Secondary | ICD-10-CM | POA: Diagnosis not present

## 2015-12-03 DIAGNOSIS — Z0183 Encounter for blood typing: Secondary | ICD-10-CM | POA: Diagnosis not present

## 2015-12-03 DIAGNOSIS — Z79899 Other long term (current) drug therapy: Secondary | ICD-10-CM | POA: Diagnosis not present

## 2015-12-03 DIAGNOSIS — Z951 Presence of aortocoronary bypass graft: Secondary | ICD-10-CM | POA: Diagnosis not present

## 2015-12-03 DIAGNOSIS — I2510 Atherosclerotic heart disease of native coronary artery without angina pectoris: Secondary | ICD-10-CM | POA: Diagnosis not present

## 2015-12-03 HISTORY — DX: Cardiac arrhythmia, unspecified: I49.9

## 2015-12-03 LAB — CBC
HCT: 36.9 % — ABNORMAL LOW (ref 39.0–52.0)
HEMOGLOBIN: 11.8 g/dL — AB (ref 13.0–17.0)
MCH: 30.8 pg (ref 26.0–34.0)
MCHC: 32 g/dL (ref 30.0–36.0)
MCV: 96.3 fL (ref 78.0–100.0)
PLATELETS: 173 10*3/uL (ref 150–400)
RBC: 3.83 MIL/uL — AB (ref 4.22–5.81)
RDW: 16.1 % — ABNORMAL HIGH (ref 11.5–15.5)
WBC: 5.9 10*3/uL (ref 4.0–10.5)

## 2015-12-03 LAB — URINALYSIS, ROUTINE W REFLEX MICROSCOPIC
Bilirubin Urine: NEGATIVE
GLUCOSE, UA: NEGATIVE mg/dL
HGB URINE DIPSTICK: NEGATIVE
KETONES UR: NEGATIVE mg/dL
LEUKOCYTES UA: NEGATIVE
Nitrite: NEGATIVE
PROTEIN: NEGATIVE mg/dL
Specific Gravity, Urine: 1.014 (ref 1.005–1.030)
pH: 6.5 (ref 5.0–8.0)

## 2015-12-03 LAB — BLOOD GAS, ARTERIAL
ACID-BASE EXCESS: 1.2 mmol/L (ref 0.0–2.0)
Bicarbonate: 24.8 mEq/L — ABNORMAL HIGH (ref 20.0–24.0)
DRAWN BY: 206361
FIO2: 0.21
O2 SAT: 95.8 %
PATIENT TEMPERATURE: 98.6
TCO2: 25.9 mmol/L (ref 0–100)
pCO2 arterial: 36.1 mmHg (ref 35.0–45.0)
pH, Arterial: 7.452 — ABNORMAL HIGH (ref 7.350–7.450)
pO2, Arterial: 82.2 mmHg (ref 80.0–100.0)

## 2015-12-03 LAB — ABO/RH: ABO/RH(D): O NEG

## 2015-12-03 LAB — COMPREHENSIVE METABOLIC PANEL
ALBUMIN: 3.4 g/dL — AB (ref 3.5–5.0)
ALT: 15 U/L — AB (ref 17–63)
AST: 28 U/L (ref 15–41)
Alkaline Phosphatase: 91 U/L (ref 38–126)
Anion gap: 11 (ref 5–15)
BUN: 23 mg/dL — ABNORMAL HIGH (ref 6–20)
CHLORIDE: 106 mmol/L (ref 101–111)
CO2: 22 mmol/L (ref 22–32)
CREATININE: 1.24 mg/dL (ref 0.61–1.24)
Calcium: 9.3 mg/dL (ref 8.9–10.3)
GFR calc non Af Amer: 49 mL/min — ABNORMAL LOW (ref >60)
GFR, EST AFRICAN AMERICAN: 57 mL/min — AB (ref >60)
GLUCOSE: 108 mg/dL — AB (ref 65–99)
Potassium: 4.9 mmol/L (ref 3.5–5.1)
SODIUM: 139 mmol/L (ref 135–145)
Total Bilirubin: 0.5 mg/dL (ref 0.3–1.2)
Total Protein: 7.2 g/dL (ref 6.5–8.1)

## 2015-12-03 LAB — TYPE AND SCREEN
ABO/RH(D): O NEG
Antibody Screen: NEGATIVE

## 2015-12-03 LAB — APTT: aPTT: 52 seconds — ABNORMAL HIGH (ref 24–37)

## 2015-12-03 LAB — PROTIME-INR
INR: 1.97 — AB (ref 0.00–1.49)
Prothrombin Time: 22.3 seconds — ABNORMAL HIGH (ref 11.6–15.2)

## 2015-12-03 LAB — SURGICAL PCR SCREEN
MRSA, PCR: NEGATIVE
Staphylococcus aureus: POSITIVE — AB

## 2015-12-03 NOTE — Pre-Procedure Instructions (Signed)
Gary Valencia  12/03/2015      [id number] - [place], [place] - [address] [place] [address] Phone: [phone] Fax: [phone]  [place], [place] [address] Phone: [phone] Fax: [phone]  [facility] [place], [place] [address] [place] [address] Phone: [phone] Fax: [phone]  [facility] [address] [place], [place] [address] Phone: [phone] Fax: [phone]    Your procedure is scheduled on Thursday, December 11, 2015.  Report to [facility] Admitting at 5:30 A.M.  Call this number if you have problems the morning of surgery:  [phone]   Remember:  Do not eat food or drink liquids after midnight Wednesday, December 10, 2015  Take these medicines the morning of surgery with A SIP OF WATER : metoprolol tartrate (LOPRESSOR)  Take last dose of warfarin (coumadin) on April 14th per doctor's instructions.     Do not wear jewelry, make-up or nail polish.  Do not wear lotions, powders, or perfumes.  You may not wear deodorant.  Do not shave 48 hours prior to surgery.  Men may shave face and neck.  Do not bring valuables to the hospital.  [facility] is not responsible for any belongings or valuables.  Contacts, dentures or bridgework may not be worn into surgery.  Leave your suitcase in the car.  After surgery it may be brought to your room.  For patients admitted to the hospital, discharge time will be determined by your treatment team.  Patients discharged the day of surgery will not be allowed to drive home.   Name and phone number of your driver:  Special instructions: [place] - Preparing for Surgery  Before surgery, you can play an important role.  Because skin is not sterile, your skin needs to be as  free of germs as possible.  You can reduce the number of germs on you skin by washing with CHG (chlorahexidine gluconate) soap before surgery.  CHG is an antiseptic cleaner which kills germs and bonds with the skin to continue killing germs even after washing.  Please DO NOT use if you have an allergy to CHG or antibacterial soaps.  If your skin becomes reddened/irritated stop using the CHG and inform your nurse when you arrive at Short Stay.  Do not shave (including legs and underarms) for at least 48 hours prior to the first CHG shower.  You may shave your face.  Please follow these instructions carefully:   1.  Shower with CHG Soap the night before surgery and the morning of Surgery.  2.  If you choose to wash your hair, wash your hair first as usual with your normal shampoo.  3.  After you shampoo, rinse your hair and body thoroughly to remove the Shampoo.  4.  Use CHG as you would any other liquid soap.  You can apply chg directly  to the skin and wash gently with scrungie or a clean washcloth.  5.  Apply the CHG Soap to your body ONLY FROM THE NECK DOWN.  Do not use on open wounds or open sores.  Avoid contact with your eyes, ears, mouth and genitals (private parts).  Wash genitals (private parts) with your normal soap.  6.  Wash thoroughly, paying special attention to the  area where your surgery will be performed.  7.  Thoroughly rinse your body with warm water from the neck down.  8.  DO NOT shower/wash with your normal soap after using and rinsing off the CHG Soap.  9.  Pat yourself dry with a clean towel.            10.  Wear clean pajamas.            11.  Place clean sheets on your bed the night of your first shower and do not sleep with pets.  Day of Surgery  Do not apply any lotions/deodorants the morning of surgery.  Please wear clean clothes to the hospital/surgery center.  Please read over the following fact sheets that you were given. Pain Booklet, Coughing and Deep  Breathing, Blood Transfusion Information, MRSA Information and Surgical Site Infection Prevention

## 2015-12-04 NOTE — Progress Notes (Addendum)
Anesthesia Chart Review: Patient is a 82 year male scheduled for EVAR AAA on 12/11/15 by Dr. Trula Slade.  History includes non-smoker, prostate cancer s/p surgery (radioactive seeds) '99, CAD s/p CABG '84, afib, AAA, anxiety, type 2 odontoid fracture s/p ORIF with screw fixation (Dr. Luiz Ochoa) '11, tonsillectomy. PCP is Dr. Jill Alexanders. Cardiologist is Dr. [name]-currently has appointment scheduled for 12/05/15, although he already completed a letter of clearance indicating patient is low to moderate risk from a cardiac standpoint (see Letters tab). He also gave permission to hold warfarin for 5 days.  Meds includes cod liver oil, Lopressor, MVI, Crestor, warfarin (to hold 5 days prior to surgery).   07/11/15 EKG: afib at 66 bpm, septal infarct (age undetermined).  03/02/11 Nuclear stress test: Normal Myocardial perfusion study. Non-gated secondary to afib. Low risk scan.  03/30/05 Echo: LV normal in size. Normal LV systolic function. Mild MAC. Mild MR. RVSP 30-43mmHg. Mild to moderate TR. Mild AV sclerosis.   10/06/15 Carotid U/S: Impression: 1. Evidence of minimal < 40% plaque in the ICA. 2. Bilateral vertebral artery is antegrade.  10/07/15 CTA chest/abd/pelvis: IMPRESSION: 1. 5.1 cm infrarenal abdominal aortic aneurysm. 2. 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 3. Four-chamber cardiac enlargement with suggestion of pulmonary hypertension. 4. Cholelithiasis 5. Sigmoid diverticulosis  Preoperative labs noted. Glucose 108. H/H 11.8/36.9. PT 22.3, INR 1.97, PTT 52. T&S done. He needs repeat PT/PTT on the day of surgery.   Unclear right now if patient is scheduled to a PT/INR visit tomorrow or if he is actually seeing. [name]. Will leave chart for follow-up records from that visit. I am also routing his CTA report to [name] since there was findings to suggest pulmonary hypertension.  [facility] Short Stay  Center/Anesthesiology Phone [phone] 12/04/2015 2:50 PM  Addendum: Patient had only INR visit on 12/05/15; however, [name] reviewed CTA report and subsequently ordered an echo to evaluate for pulmonary hypertension. Echo was done on 12/08/15 and showed: Impressions: - Normal LV size with EF 55-60%. Normal RV size and systolic function. Moderate biatrial enlargement. Mild AI, aortic sclerosis without stenosis. Mild MR. Moderate TR. PA peak pressure 42 mm Hg (S). Mild pulmonary hypertension.  If follow-up PT/PTT results are acceptable for the OR and otherwise no acute changes then I would anticipate that he could proceed as planned.  [facility] Short Stay Center/Anesthesiology Phone [phone] 12/09/2015 9:30 AM

## 2015-12-05 ENCOUNTER — Other Ambulatory Visit: Payer: Self-pay

## 2015-12-05 ENCOUNTER — Other Ambulatory Visit: Payer: Self-pay | Admitting: Cardiovascular Disease

## 2015-12-05 ENCOUNTER — Ambulatory Visit (INDEPENDENT_AMBULATORY_CARE_PROVIDER_SITE_OTHER): Payer: Medicare Other | Admitting: Pharmacist Clinician (PhC)/ Clinical Pharmacy Specialist

## 2015-12-05 DIAGNOSIS — I482 Chronic atrial fibrillation, unspecified: Secondary | ICD-10-CM

## 2015-12-05 DIAGNOSIS — Z7901 Long term (current) use of anticoagulants: Secondary | ICD-10-CM

## 2015-12-05 DIAGNOSIS — I714 Abdominal aortic aneurysm, without rupture, unspecified: Secondary | ICD-10-CM

## 2015-12-05 DIAGNOSIS — I2510 Atherosclerotic heart disease of native coronary artery without angina pectoris: Secondary | ICD-10-CM

## 2015-12-05 LAB — POCT INR: INR: 2.1

## 2015-12-05 MED ORDER — WARFARIN SODIUM 5 MG PO TABS: ORAL_TABLET | ORAL | Status: DC

## 2015-12-08 ENCOUNTER — Ambulatory Visit (HOSPITAL_BASED_OUTPATIENT_CLINIC_OR_DEPARTMENT_OTHER)
Admission: RE | Admit: 2015-12-08 | Discharge: 2015-12-08 | Disposition: A | Discharge status: Home / Self Care | Payer: Medicare Other | Source: Ambulatory Visit | Attending: Cardiology | Admitting: Cardiology

## 2015-12-08 DIAGNOSIS — I358 Other nonrheumatic aortic valve disorders: Secondary | ICD-10-CM | POA: Insufficient documentation

## 2015-12-08 DIAGNOSIS — I119 Hypertensive heart disease without heart failure: Secondary | ICD-10-CM | POA: Insufficient documentation

## 2015-12-08 DIAGNOSIS — I071 Rheumatic tricuspid insufficiency: Secondary | ICD-10-CM

## 2015-12-08 DIAGNOSIS — I272 Other secondary pulmonary hypertension: Secondary | ICD-10-CM

## 2015-12-08 DIAGNOSIS — I482 Chronic atrial fibrillation, unspecified: Secondary | ICD-10-CM

## 2015-12-08 DIAGNOSIS — I351 Nonrheumatic aortic (valve) insufficiency: Secondary | ICD-10-CM

## 2015-12-08 DIAGNOSIS — I340 Nonrheumatic mitral (valve) insufficiency: Secondary | ICD-10-CM

## 2015-12-08 DIAGNOSIS — I2510 Atherosclerotic heart disease of native coronary artery without angina pectoris: Secondary | ICD-10-CM

## 2015-12-08 DIAGNOSIS — E785 Hyperlipidemia, unspecified: Secondary | ICD-10-CM

## 2015-12-10 ENCOUNTER — Telehealth: Payer: Self-pay | Admitting: Cardiovascular Disease

## 2015-12-10 NOTE — Telephone Encounter (Signed)
Result give about echo

## 2015-12-10 NOTE — Telephone Encounter (Signed)
New message     Patient calling back to speak with the nurse from yesterday regarding her husband.

## 2015-12-10 NOTE — Telephone Encounter (Signed)
Spoke to patien'ts wife. Result given . Verbalized understanding  

## 2015-12-11 ENCOUNTER — Encounter (HOSPITAL_COMMUNITY): Payer: Self-pay | Admitting: *Deleted

## 2015-12-11 ENCOUNTER — Inpatient Hospital Stay (HOSPITAL_COMMUNITY): Payer: Medicare Other | Admitting: Certified Registered"

## 2015-12-11 ENCOUNTER — Inpatient Hospital Stay (HOSPITAL_COMMUNITY): Payer: Medicare Other

## 2015-12-11 ENCOUNTER — Inpatient Hospital Stay (HOSPITAL_COMMUNITY)
Admission: RE | Admit: 2015-12-11 | Discharge: 2015-12-12 | DRG: 269 | Disposition: A | Discharge status: Home / Self Care | Payer: Medicare Other | Source: Ambulatory Visit | Attending: Surgery | Admitting: Surgery

## 2015-12-11 ENCOUNTER — Inpatient Hospital Stay (HOSPITAL_COMMUNITY): Payer: Medicare Other | Admitting: Vascular Surgery

## 2015-12-11 ENCOUNTER — Encounter (HOSPITAL_COMMUNITY)
Admission: RE | Disposition: A | Discharge status: Home / Self Care | Payer: Self-pay | Source: Ambulatory Visit | Attending: Surgery

## 2015-12-11 ENCOUNTER — Other Ambulatory Visit: Payer: Self-pay | Admitting: *Deleted

## 2015-12-11 DIAGNOSIS — Z87891 Personal history of nicotine dependence: Secondary | ICD-10-CM

## 2015-12-11 DIAGNOSIS — E7800 Pure hypercholesterolemia, unspecified: Secondary | ICD-10-CM | POA: Diagnosis present

## 2015-12-11 DIAGNOSIS — Z951 Presence of aortocoronary bypass graft: Secondary | ICD-10-CM

## 2015-12-11 DIAGNOSIS — I2510 Atherosclerotic heart disease of native coronary artery without angina pectoris: Secondary | ICD-10-CM | POA: Diagnosis present

## 2015-12-11 DIAGNOSIS — Z8546 Personal history of malignant neoplasm of prostate: Secondary | ICD-10-CM

## 2015-12-11 DIAGNOSIS — I714 Abdominal aortic aneurysm, without rupture, unspecified: Secondary | ICD-10-CM

## 2015-12-11 DIAGNOSIS — M1990 Unspecified osteoarthritis, unspecified site: Secondary | ICD-10-CM | POA: Diagnosis present

## 2015-12-11 DIAGNOSIS — I739 Peripheral vascular disease, unspecified: Secondary | ICD-10-CM | POA: Diagnosis present

## 2015-12-11 DIAGNOSIS — Z95828 Presence of other vascular implants and grafts: Secondary | ICD-10-CM

## 2015-12-11 DIAGNOSIS — Z7901 Long term (current) use of anticoagulants: Secondary | ICD-10-CM | POA: Diagnosis not present

## 2015-12-11 DIAGNOSIS — I272 Other secondary pulmonary hypertension: Secondary | ICD-10-CM | POA: Diagnosis present

## 2015-12-11 DIAGNOSIS — I10 Essential (primary) hypertension: Secondary | ICD-10-CM | POA: Diagnosis present

## 2015-12-11 DIAGNOSIS — Z8679 Personal history of other diseases of the circulatory system: Secondary | ICD-10-CM

## 2015-12-11 DIAGNOSIS — F419 Anxiety disorder, unspecified: Secondary | ICD-10-CM | POA: Diagnosis present

## 2015-12-11 DIAGNOSIS — R079 Chest pain, unspecified: Secondary | ICD-10-CM | POA: Diagnosis present

## 2015-12-11 DIAGNOSIS — I4891 Unspecified atrial fibrillation: Secondary | ICD-10-CM | POA: Diagnosis present

## 2015-12-11 DIAGNOSIS — Z9889 Other specified postprocedural states: Secondary | ICD-10-CM

## 2015-12-11 HISTORY — PX: ABDOMINAL AORTIC ENDOVASCULAR STENT GRAFT: SHX5707

## 2015-12-11 LAB — BASIC METABOLIC PANEL
ANION GAP: 10 (ref 5–15)
BUN: 15 mg/dL (ref 6–20)
CHLORIDE: 107 mmol/L (ref 101–111)
CO2: 23 mmol/L (ref 22–32)
Calcium: 8.5 mg/dL — ABNORMAL LOW (ref 8.9–10.3)
Creatinine, Ser: 1.13 mg/dL (ref 0.61–1.24)
GFR calc non Af Amer: 55 mL/min — ABNORMAL LOW (ref >60)
Glucose, Bld: 91 mg/dL (ref 65–99)
POTASSIUM: 4.1 mmol/L (ref 3.5–5.1)
Sodium: 140 mmol/L (ref 135–145)

## 2015-12-11 LAB — PROTIME-INR
INR: 1.27 (ref 0.00–1.49)
INR: 1.47 (ref 0.00–1.49)
Prothrombin Time: 16.1 seconds — ABNORMAL HIGH (ref 11.6–15.2)
Prothrombin Time: 17.9 seconds — ABNORMAL HIGH (ref 11.6–15.2)

## 2015-12-11 LAB — CBC
HEMATOCRIT: 33.6 % — AB (ref 39.0–52.0)
HEMOGLOBIN: 10.9 g/dL — AB (ref 13.0–17.0)
MCH: 31.1 pg (ref 26.0–34.0)
MCHC: 32.4 g/dL (ref 30.0–36.0)
MCV: 95.7 fL (ref 78.0–100.0)
Platelets: 102 10*3/uL — ABNORMAL LOW (ref 150–400)
RBC: 3.51 MIL/uL — ABNORMAL LOW (ref 4.22–5.81)
RDW: 16 % — ABNORMAL HIGH (ref 11.5–15.5)
WBC: 5.1 10*3/uL (ref 4.0–10.5)

## 2015-12-11 LAB — APTT
APTT: 36 s (ref 24–37)
APTT: 36 s (ref 24–37)

## 2015-12-11 LAB — MAGNESIUM: MAGNESIUM: 2.2 mg/dL (ref 1.7–2.4)

## 2015-12-11 IMAGING — CR DG CHEST 1V PORT
1 series · 1 of 1 positions shown · non-contrast
Comparison: [DATE] chest CT and [DATE] chest radiograph

CLINICAL DATA: Status post AAA repair

EXAM:
PORTABLE CHEST 1 VIEW

[AP]
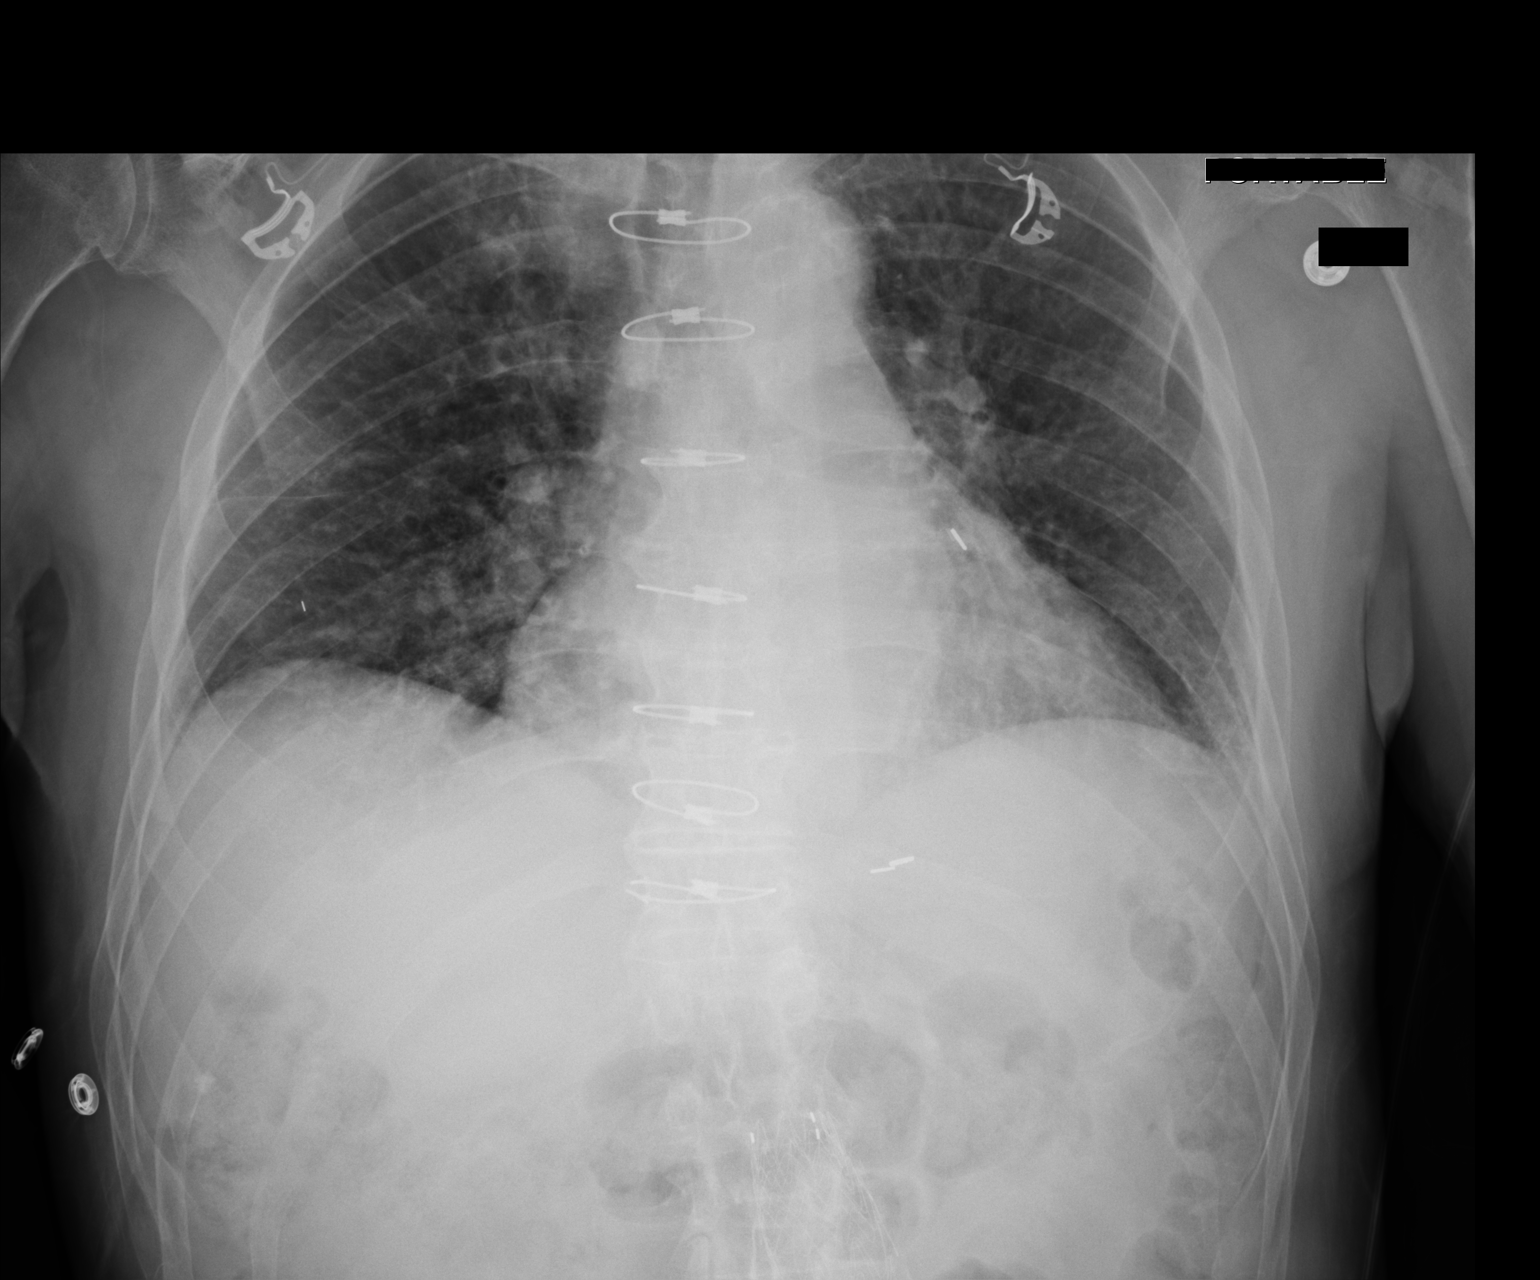

[1 of 1 positions shown; findings below may reference images not displayed]

FINDINGS: Stable discontinuity in the lower most sternotomy wire. Surgical
clips overlie the left mediastinum. Partially visualized abdominal
aortic stent graft. Stable cardiomediastinal silhouette with
top-normal heart size. No pneumothorax. No pleural effusion.
Slightly low lung volumes. No pulmonary edema. No acute
consolidative airspace disease. Stable mild reticular opacities at
the lung bases.
IMPRESSION: Stable mild chronic reticular opacities at the lung bases,
nonspecific, probably representing a mild interstitial lung disease.
Otherwise no active disease in the chest.

## 2015-12-11 SURGERY — INSERTION, ENDOVASCULAR STENT GRAFT, AORTA, ABDOMINAL
Anesthesia: General

## 2015-12-11 MED ORDER — PROPOFOL 10 MG/ML IV BOLUS
INTRAVENOUS | Status: AC
Start: 2015-12-11 — End: 2015-12-11
  Filled 2015-12-11: qty 20

## 2015-12-11 MED ORDER — CEFAZOLIN SODIUM 1 G IJ SOLR
INTRAMUSCULAR | Status: DC | PRN
  Administered 2015-12-11: 2 g via INTRAMUSCULAR

## 2015-12-11 MED ORDER — MAGNESIUM SULFATE 2 GM/50ML IV SOLN: 2.0000 g | Freq: Every day | INTRAVENOUS | Status: DC | PRN

## 2015-12-11 MED ORDER — CHLORHEXIDINE GLUCONATE CLOTH 2 % EX PADS: 6.0000 | MEDICATED_PAD | Freq: Once | CUTANEOUS | Status: DC

## 2015-12-11 MED ORDER — FENTANYL CITRATE (PF) 100 MCG/2ML IJ SOLN: 25.0000 ug | INTRAMUSCULAR | Status: DC | PRN

## 2015-12-11 MED ORDER — OXYCODONE-ACETAMINOPHEN 5-325 MG PO TABS: 1.0000 | ORAL_TABLET | Freq: Four times a day (QID) | ORAL | Status: DC | PRN

## 2015-12-11 MED ORDER — ONDANSETRON HCL 4 MG/2ML IJ SOLN
INTRAMUSCULAR | Status: AC
  Filled 2015-12-11: qty 2

## 2015-12-11 MED ORDER — MORPHINE SULFATE (PF) 2 MG/ML IV SOLN: 1.0000 mg | INTRAVENOUS | Status: DC | PRN

## 2015-12-11 MED ORDER — HEPARIN SODIUM (PORCINE) 1000 UNIT/ML IJ SOLN
INTRAMUSCULAR | Status: DC | PRN
  Administered 2015-12-11: 4 mL via INTRAVENOUS
  Administered 2015-12-11: 3 mL via INTRAVENOUS

## 2015-12-11 MED ORDER — ROSUVASTATIN CALCIUM 10 MG PO TABS
10.0000 mg | ORAL_TABLET | Freq: Every day | ORAL | Status: DC
  Administered 2015-12-11: 10 mg via ORAL
  Filled 2015-12-11: qty 1

## 2015-12-11 MED ORDER — GUAIFENESIN-DM 100-10 MG/5ML PO SYRP: 15.0000 mL | ORAL_SOLUTION | ORAL | Status: DC | PRN

## 2015-12-11 MED ORDER — LABETALOL HCL 5 MG/ML IV SOLN
INTRAVENOUS | Status: DC | PRN
  Administered 2015-12-11: 10 mg via INTRAVENOUS

## 2015-12-11 MED ORDER — SODIUM CHLORIDE 0.45 % IV SOLN
INTRAVENOUS | Status: DC
  Administered 2015-12-11: 15:00:00 via INTRAVENOUS

## 2015-12-11 MED ORDER — PHENYLEPHRINE 40 MCG/ML (10ML) SYRINGE FOR IV PUSH (FOR BLOOD PRESSURE SUPPORT)
PREFILLED_SYRINGE | INTRAVENOUS | Status: AC
  Filled 2015-12-11: qty 10

## 2015-12-11 MED ORDER — PANTOPRAZOLE SODIUM 40 MG PO TBEC
40.0000 mg | DELAYED_RELEASE_TABLET | Freq: Every day | ORAL | Status: DC
  Administered 2015-12-11 – 2015-12-12 (×2): 40 mg via ORAL
  Filled 2015-12-11 (×2): qty 1

## 2015-12-11 MED ORDER — ROCURONIUM BROMIDE 50 MG/5ML IV SOLN
INTRAVENOUS | Status: AC
Start: 2015-12-11 — End: 2015-12-11
  Filled 2015-12-11: qty 1

## 2015-12-11 MED ORDER — ACETAMINOPHEN 650 MG RE SUPP: 325.0000 mg | RECTAL | Status: DC | PRN

## 2015-12-11 MED ORDER — DOCUSATE SODIUM 100 MG PO CAPS
100.0000 mg | ORAL_CAPSULE | Freq: Every day | ORAL | Status: DC
  Administered 2015-12-12: 100 mg via ORAL
  Filled 2015-12-11: qty 1

## 2015-12-11 MED ORDER — PROTAMINE SULFATE 10 MG/ML IV SOLN
INTRAVENOUS | Status: DC | PRN
  Administered 2015-12-11: 50 mg via INTRAVENOUS
  Administered 2015-12-11: 25 mg via INTRAVENOUS

## 2015-12-11 MED ORDER — SODIUM CHLORIDE 0.9 % IV SOLN: INTRAVENOUS | Status: DC

## 2015-12-11 MED ORDER — DEXTROSE 5 % IV SOLN: 1.5000 g | INTRAVENOUS | Status: DC

## 2015-12-11 MED ORDER — LIDOCAINE HCL (CARDIAC) 20 MG/ML IV SOLN
INTRAVENOUS | Status: DC | PRN
  Administered 2015-12-11: 40 mg via INTRAVENOUS

## 2015-12-11 MED ORDER — HYDRALAZINE HCL 20 MG/ML IJ SOLN: 5.0000 mg | INTRAMUSCULAR | Status: DC | PRN

## 2015-12-11 MED ORDER — HEPARIN SODIUM (PORCINE) 5000 UNIT/ML IJ SOLN
INTRAMUSCULAR | Status: DC | PRN
  Administered 2015-12-11: 500 mL

## 2015-12-11 MED ORDER — ONDANSETRON HCL 4 MG/2ML IJ SOLN
INTRAMUSCULAR | Status: DC | PRN
  Administered 2015-12-11: 4 mg via INTRAVENOUS

## 2015-12-11 MED ORDER — DEXTROSE 5 % IV SOLN
1.5000 g | Freq: Two times a day (BID) | INTRAVENOUS | Status: AC
  Administered 2015-12-11 – 2015-12-12 (×2): 1.5 g via INTRAVENOUS
  Filled 2015-12-11 (×2): qty 1.5

## 2015-12-11 MED ORDER — LABETALOL HCL 5 MG/ML IV SOLN: 10.0000 mg | INTRAVENOUS | Status: DC | PRN

## 2015-12-11 MED ORDER — PHENOL 1.4 % MT LIQD: 1.0000 | OROMUCOSAL | Status: DC | PRN

## 2015-12-11 MED ORDER — ADULT MULTIVITAMIN W/MINERALS CH
1.0000 | ORAL_TABLET | Freq: Every day | ORAL | Status: DC
  Administered 2015-12-11 – 2015-12-12 (×2): 1 via ORAL
  Filled 2015-12-11 (×2): qty 1

## 2015-12-11 MED ORDER — OXYCODONE-ACETAMINOPHEN 5-325 MG PO TABS: 1.0000 | ORAL_TABLET | ORAL | Status: DC | PRN

## 2015-12-11 MED ORDER — FENTANYL CITRATE (PF) 100 MCG/2ML IJ SOLN
INTRAMUSCULAR | Status: DC | PRN
  Administered 2015-12-11 (×4): 50 ug via INTRAVENOUS

## 2015-12-11 MED ORDER — FENTANYL CITRATE (PF) 250 MCG/5ML IJ SOLN
INTRAMUSCULAR | Status: AC
Start: 2015-12-11 — End: 2015-12-11
  Filled 2015-12-11: qty 5

## 2015-12-11 MED ORDER — SUGAMMADEX SODIUM 200 MG/2ML IV SOLN
INTRAVENOUS | Status: AC
  Filled 2015-12-11: qty 2

## 2015-12-11 MED ORDER — 0.9 % SODIUM CHLORIDE (POUR BTL) OPTIME
TOPICAL | Status: DC | PRN
  Administered 2015-12-11: 1000 mL

## 2015-12-11 MED ORDER — EPHEDRINE SULFATE 50 MG/ML IJ SOLN
INTRAMUSCULAR | Status: DC | PRN
  Administered 2015-12-11 (×2): 5 mg via INTRAVENOUS

## 2015-12-11 MED ORDER — METOPROLOL TARTRATE 12.5 MG HALF TABLET
12.5000 mg | ORAL_TABLET | Freq: Two times a day (BID) | ORAL | Status: DC
  Administered 2015-12-12: 12.5 mg via ORAL
  Filled 2015-12-11: qty 1

## 2015-12-11 MED ORDER — ROCURONIUM BROMIDE 100 MG/10ML IV SOLN
INTRAVENOUS | Status: DC | PRN
  Administered 2015-12-11: 10 mg via INTRAVENOUS
  Administered 2015-12-11: 40 mg via INTRAVENOUS

## 2015-12-11 MED ORDER — LIDOCAINE HCL (CARDIAC) 20 MG/ML IV SOLN
INTRAVENOUS | Status: AC
  Filled 2015-12-11: qty 5

## 2015-12-11 MED ORDER — IODIXANOL 320 MG/ML IV SOLN
INTRAVENOUS | Status: DC | PRN
  Administered 2015-12-11: 45.28 mL via INTRAVENOUS

## 2015-12-11 MED ORDER — METOPROLOL TARTRATE 1 MG/ML IV SOLN: 2.0000 mg | INTRAVENOUS | Status: DC | PRN

## 2015-12-11 MED ORDER — MUPIROCIN 2 % EX OINT
1.0000 "application " | TOPICAL_OINTMENT | Freq: Two times a day (BID) | CUTANEOUS | Status: DC
  Administered 2015-12-12: 1 via NASAL
  Filled 2015-12-11: qty 22

## 2015-12-11 MED ORDER — SUGAMMADEX SODIUM 200 MG/2ML IV SOLN
INTRAVENOUS | Status: DC | PRN
  Administered 2015-12-11: 138 mg via INTRAVENOUS

## 2015-12-11 MED ORDER — CHLORHEXIDINE GLUCONATE CLOTH 2 % EX PADS
6.0000 | MEDICATED_PAD | Freq: Every day | CUTANEOUS | Status: DC
  Administered 2015-12-12: 6 via TOPICAL

## 2015-12-11 MED ORDER — ALUM & MAG HYDROXIDE-SIMETH 200-200-20 MG/5ML PO SUSP: 15.0000 mL | ORAL | Status: DC | PRN

## 2015-12-11 MED ORDER — LACTATED RINGERS IV SOLN
INTRAVENOUS | Status: DC | PRN
  Administered 2015-12-11: 07:00:00 via INTRAVENOUS

## 2015-12-11 MED ORDER — ACETAMINOPHEN 325 MG PO TABS: 325.0000 mg | ORAL_TABLET | ORAL | Status: DC | PRN

## 2015-12-11 MED ORDER — PROPOFOL 10 MG/ML IV BOLUS
INTRAVENOUS | Status: DC | PRN
  Administered 2015-12-11: 90 mg via INTRAVENOUS
  Administered 2015-12-11: 20 mg via INTRAVENOUS

## 2015-12-11 MED ORDER — ONDANSETRON HCL 4 MG/2ML IJ SOLN: 4.0000 mg | Freq: Four times a day (QID) | INTRAMUSCULAR | Status: DC | PRN

## 2015-12-11 MED ORDER — SODIUM CHLORIDE 0.9 % IV SOLN: 500.0000 mL | Freq: Once | INTRAVENOUS | Status: DC | PRN

## 2015-12-11 MED ORDER — PHENYLEPHRINE HCL 10 MG/ML IJ SOLN
20.0000 mg | INTRAVENOUS | Status: DC | PRN
  Administered 2015-12-11: 20 ug/min via INTRAVENOUS

## 2015-12-11 MED ORDER — POTASSIUM CHLORIDE CRYS ER 20 MEQ PO TBCR: 20.0000 meq | EXTENDED_RELEASE_TABLET | Freq: Every day | ORAL | Status: DC | PRN

## 2015-12-11 SURGICAL SUPPLY — 67 items
BAG SNAP BAND KOVER 36X36 (MISCELLANEOUS) ×2 IMPLANT
BLADE SURG CLIPPER 3M 9600 (MISCELLANEOUS) ×3 IMPLANT
CANISTER SUCTION 2500CC (MISCELLANEOUS) ×3 IMPLANT
CATH ANGIO 5F BER2 65CM (CATHETERS) ×2 IMPLANT
CATH BEACON 5.038 65CM KMP-01 (CATHETERS) IMPLANT
CATH COUDE FOLEY 5CC 14FR (CATHETERS) ×2 IMPLANT
CATH OMNI FLUSH .035X70CM (CATHETERS) ×4 IMPLANT
COVER PROBE W GEL 5X96 (DRAPES) ×3 IMPLANT
DEVICE CLOSURE PERCLS PRGLD 6F (VASCULAR PRODUCTS) IMPLANT
DEVICE TORQUE H2O (MISCELLANEOUS) ×2 IMPLANT
DRAPE ZERO GRAVITY STERILE (DRAPES) ×3 IMPLANT
DRSG TEGADERM 2-3/8X2-3/4 SM (GAUZE/BANDAGES/DRESSINGS) ×6 IMPLANT
DRYSEAL FLEXSHEATH 12FR 33CM (SHEATH) ×2
DRYSEAL FLEXSHEATH 18FR 33CM (SHEATH) ×2
ELECT CAUTERY BLADE 6.4 (BLADE) ×2 IMPLANT
ELECT REM PT RETURN 9FT ADLT (ELECTROSURGICAL) ×6
ELECTRODE REM PT RTRN 9FT ADLT (ELECTROSURGICAL) ×2 IMPLANT
EXCLUDER TNK LEG 31MX14X13 (Endovascular Graft) IMPLANT
EXCLUDER TRUNK LEG 31MX14X13 (Endovascular Graft) ×3 IMPLANT
GAUZE SPONGE 2X2 8PLY STRL LF (GAUZE/BANDAGES/DRESSINGS) ×2 IMPLANT
GLOVE BIO SURGEON STRL SZ 6.5 (GLOVE) ×1 IMPLANT
GLOVE BIO SURGEONS STRL SZ 6.5 (GLOVE) ×1
GLOVE BIOGEL PI IND STRL 7.5 (GLOVE) ×1 IMPLANT
GLOVE BIOGEL PI INDICATOR 7.5 (GLOVE) ×2
GLOVE SURG SS PI 7.5 STRL IVOR (GLOVE) ×3 IMPLANT
GOWN STRL REUS W/ TWL LRG LVL3 (GOWN DISPOSABLE) ×2 IMPLANT
GOWN STRL REUS W/ TWL XL LVL3 (GOWN DISPOSABLE) ×1 IMPLANT
GOWN STRL REUS W/TWL LRG LVL3 (GOWN DISPOSABLE) ×6
GOWN STRL REUS W/TWL XL LVL3 (GOWN DISPOSABLE) ×3
GRAFT BALLN CATH 65CM (STENTS) IMPLANT
GRAFT EXCLUDER LEG 12X12 (Endovascular Graft) ×2 IMPLANT
GUIDEWIRE ANGLED .035X150CM (WIRE) ×2 IMPLANT
HEMOSTAT SNOW SURGICEL 2X4 (HEMOSTASIS) IMPLANT
KIT BASIN OR (CUSTOM PROCEDURE TRAY) ×3 IMPLANT
KIT ROOM TURNOVER OR (KITS) ×3 IMPLANT
LEG CONTRALATERAL 16X20X9.5 (Endovascular Graft) ×3 IMPLANT
LIQUID BAND (GAUZE/BANDAGES/DRESSINGS) ×3 IMPLANT
NDL PERC 18GX7CM (NEEDLE) ×1 IMPLANT
NEEDLE PERC 18GX7CM (NEEDLE) ×3 IMPLANT
NS IRRIG 1000ML POUR BTL (IV SOLUTION) ×3 IMPLANT
PACK ENDOVASCULAR (PACKS) ×3 IMPLANT
PAD ARMBOARD 7.5X6 YLW CONV (MISCELLANEOUS) ×6 IMPLANT
PENCIL BUTTON HOLSTER BLD 10FT (ELECTRODE) ×2 IMPLANT
PERCLOSE PROGLIDE 6F (VASCULAR PRODUCTS) ×15
SHEATH AVANTI 11CM 8FR (MISCELLANEOUS) ×2 IMPLANT
SHEATH BRITE TIP 8FR 23CM (MISCELLANEOUS) ×2 IMPLANT
SHEATH DRYSEAL FLEX 12FR 33CM (SHEATH) IMPLANT
SHEATH DRYSEAL FLEX 18FR 33CM (SHEATH) IMPLANT
SHIELD RADPAD SCOOP 12X17 (MISCELLANEOUS) ×10 IMPLANT
SPONGE GAUZE 2X2 STER 10/PKG (GAUZE/BANDAGES/DRESSINGS) ×4
STENT GRAFT BALLN CATH 65CM (STENTS) ×2
STENT GRAFT CONTRALAT 16X20X9. (Endovascular Graft) IMPLANT
STOPCOCK MORSE 400PSI 3WAY (MISCELLANEOUS) ×3 IMPLANT
SUT ETHILON 3 0 PS 1 (SUTURE) IMPLANT
SUT PROLENE 5 0 C 1 24 (SUTURE) IMPLANT
SUT VIC AB 2-0 CT1 27 (SUTURE)
SUT VIC AB 2-0 CT1 TAPERPNT 27 (SUTURE) IMPLANT
SUT VIC AB 3-0 SH 27 (SUTURE)
SUT VIC AB 3-0 SH 27X BRD (SUTURE) IMPLANT
SUT VICRYL 4-0 PS2 18IN ABS (SUTURE) ×6 IMPLANT
SYR 30ML LL (SYRINGE) ×3 IMPLANT
TRAY FOLEY W/METER SILVER 16FR (SET/KITS/TRAYS/PACK) ×3 IMPLANT
TUBING BULK SUCTION (MISCELLANEOUS) ×2 IMPLANT
TUBING HIGH PRESSURE 120CM (CONNECTOR) ×5 IMPLANT
WIRE AMPLATZ SS-J .035X180CM (WIRE) ×4 IMPLANT
WIRE BENTSON .035X145CM (WIRE) ×2 IMPLANT
YANKAUER SUCT BULB TIP NO VENT (SUCTIONS) ×2 IMPLANT

## 2015-12-11 NOTE — OR Nursing (Signed)
Attempted x2 to insert foley catheter, stopped both times when meeting resistance, MD Brabham aware

## 2015-12-11 NOTE — Anesthesia Procedure Notes (Signed)
Procedure Name: Intubation Date/Time: 12/11/2015 8:49 AM Performed by: Lance Coon Pre-anesthesia Checklist: Patient identified, Emergency Drugs available, Suction available, Patient being monitored and Timeout performed Patient Re-evaluated:Patient Re-evaluated prior to inductionOxygen Delivery Method: Circle system utilized Preoxygenation: Pre-oxygenation with 100% oxygen Intubation Type: IV induction Ventilation: Mask ventilation without difficulty Laryngoscope Size: Miller and 2 Grade View: Grade I Tube type: Oral Tube size: 7.5 mm Number of attempts: 1 Airway Equipment and Method: Stylet Placement Confirmation: ETT inserted through vocal cords under direct vision,  positive ETCO2 and breath sounds checked- equal and bilateral Secured at: 22 cm Tube secured with: Tape Dental Injury: Teeth and Oropharynx as per pre-operative assessment

## 2015-12-11 NOTE — Op Note (Signed)
Patient name: Gary Valencia MRN: [id number] DOB: [birth year] Sex: male  12/11/2015 Pre-operative Diagnosis: AAA Post-operative diagnosis:  Same Surgeon:  Annamarie Major Assistants:  Silva Bandy Procedure:   #1: Endovascular repair of abdominal aortic aneurysm   #2: Bilateral ultrasound-guided percutaneous access, common femoral artery   #3: Catheter aorta 2   #4: Abdominal aortogram   #5: Distal extension 1 Anesthesia:  Gen. Blood Loss:  See anesthesia record Specimens:  None  Findings:  Complete exclusion Devices used: Main body was primary right Gore 31 x 14 x 13.  Ipsilateral right extension was a Gore 20 x 9.5.  Contralateral limb was a Gore 12 x 12  Indications:  The patient has been followed for a infrarenal abdominal aortic aneurysm for many years.  It has now progressed to greater than 5 cm.  Despite his age, we had a lengthy conversation of the office outlining our options.  He is a very healthy 82 year old gentleman and wishes to have this repaired.  Procedure:  The patient was identified in the holding area and taken to [place] 16  The patient was then placed supine on the table. general anesthesia was administered.  The patient was prepped and draped in the usual sterile fashion.  A time out was called and antibiotics were administered.  A Foley catheter was unable to be placed therefore, we proceeded without it.  Ultrasound was used to evaluate bilateral common femoral arteries which are widely patent with minimal calcification.  A #11 blade was used to make a skin nick.  An 18-gauge needle was used to cannulate bilateral common femoral arteries under ultrasound guidance.  An 035 wire was advanced without resistance.  The subtenon's tract was dilated with an 8 [place] dilator.  Provide devices were deployed at the 11:00 and 1:00 position for pre-closure.  I placed a 18 French sheath over an Amplatz wire up the right side.  A 8 French sheath was advanced up the left side.   Systemic heparinization was administered.  An omni-flush catheter was advanced to the level of L-1.  The main body device was prepared on the back table.  This was a Gore C tag 31 x 14 x 13.  This is advanced through the sheath and positioned in the infrarenal abdominal aorta a contrast injection wasn't performed locating the lowest left renal artery.  The device was then deployed down to the contralateral gate.  The device migrated distally and therefore it was re-constrained and advanced proximally 1 cm and redeployed.  This time it was in good position.  Next a 75 French sheath was advanced over a Amplatz wire on the left.  A second access in the 12 French sheath was then obtained through this the contralateral gate was cannulated with an omni-flush catheter and a Glidewire.  The catheter was able to be freely rotated within the main body, confirming successful cannulation.  An Amplatz superstiff wire was then placed.  The image detector was rotated to a right anterior oblique position and a retrograde injection was performed locating the left hypogastric artery.  Next the contralateral left limb, a Gore 12 x 12 device was prepared and advanced through the sheath into proper position and deployed landing just proximal to the left hypogastric artery.  Next, the remaining portion of the ipsilateral limb was deployed.  The image detector was rotated to a left anterior oblique position and a retrograde injection was performed locating the right hypogastric artery.  A right contralateral  extension was then prepared this is a Gore 20 x 9.5.  He was advanced the sheath and positioned at the right hypogastric artery origin and then deployed.  Next, a Q-50 balloon was used to mold the proximal and distal attachment sites as well as device overlap.  A completion image was then performed which showed successful exclusion of aneurysm with no evidence of endoleak.  There was preservation of both renal arteries and hypogastric  arteries.  Next, 035 Bentson wires were placed.  The previously deployed program devices were then secured for closure of the arteriotomy site.  50 mg protamine was given.  There was a mild ooze from both groin sites, and therefore manual pressure was held for 10 minutes.  Dermabond was placed.  Doppler signals were excellent in bilateral lower extremities.   Disposition:  To PACU in stable condition.   Theotis Burrow, M.D. [facility] [place] Office: [phone] Pager:  [phone]

## 2015-12-11 NOTE — Anesthesia Postprocedure Evaluation (Signed)
Anesthesia Post Note  Patient: Gary Valencia  Procedure(s) Performed: Procedure(s) (LRB): ABDOMINAL AORTIC ENDOVASCULAR STENT GRAFT (N/A)  Patient location during evaluation: PACU Anesthesia Type: General Level of consciousness: awake and alert, oriented and patient cooperative Pain management: pain level controlled Vital Signs Assessment: post-procedure vital signs reviewed and stable Respiratory status: spontaneous breathing, nonlabored ventilation, respiratory function stable and patient connected to nasal cannula oxygen Cardiovascular status: blood pressure returned to baseline and stable Postop Assessment: no signs of nausea or vomiting Anesthetic complications: no    Last Vitals:  Filed Vitals:   12/11/15 1415 12/11/15 1417  BP:    Pulse: 51 53  Temp:    Resp:  0    Last Pain:  Filed Vitals:   12/11/15 1419  PainSc: 0-No pain                 Deya Bigos,E. Anatalia Kronk

## 2015-12-11 NOTE — Anesthesia Preprocedure Evaluation (Addendum)
Anesthesia Evaluation  Patient identified by MRN, date of birth, ID band Patient awake    Reviewed: Allergy & Precautions, NPO status , Patient's Chart, lab work & pertinent test results, reviewed documented beta blocker date and time   History of Anesthesia Complications Negative for: history of anesthetic complications  Airway Mallampati: II  TM Distance: >3 FB Neck ROM: Full    Dental  (+) Dental Advisory Given   Pulmonary neg pulmonary ROS,    breath sounds clear to auscultation       Cardiovascular hypertension, Pt. on medications and Pt. on home beta blockers (-) angina+ CAD, + CABG and + Peripheral Vascular Disease (AAA)  + dysrhythmias Atrial Fibrillation  Rhythm:Irregular Rate:Normal - Systolic murmurs [id number] ECHO:  EF 55-60%, mild MR, mod TR, mild pulm HTN   Neuro/Psych negative neurological ROS     GI/Hepatic negative GI ROS, Neg liver ROS,   Endo/Other  negative endocrine ROS  Renal/GU negative Renal ROS   Prostate cancer    Musculoskeletal  (+) Arthritis , Osteoarthritis,    Abdominal   Peds  Hematology  (+) Blood dyscrasia (coumadin: INR 1.27), ,   Anesthesia Other Findings   Reproductive/Obstetrics                            Anesthesia Physical Anesthesia Plan  ASA: III  Anesthesia Plan: General   Post-op Pain Management:    Induction: Intravenous  Airway Management Planned: Oral ETT  Additional Equipment: Arterial line  Intra-op Plan:   Post-operative Plan: Extubation in OR  Informed Consent: I have reviewed the patients History and Physical, chart, labs and discussed the procedure including the risks, benefits and alternatives for the proposed anesthesia with the patient or authorized representative who has indicated his/her understanding and acceptance.   Dental advisory given  Plan Discussed with: CRNA and Surgeon  Anesthesia Plan Comments: (Plan routine  monitors, A-line, GETA)        Anesthesia Quick Evaluation

## 2015-12-11 NOTE — Transfer of Care (Signed)
Immediate Anesthesia Transfer of Care Note  Patient: Gary Valencia  Procedure(s) Performed: Procedure(s): ABDOMINAL AORTIC ENDOVASCULAR STENT GRAFT (N/A)  Patient Location: PACU  Anesthesia Type:General  Level of Consciousness: awake, alert , oriented and patient cooperative  Airway & Oxygen Therapy: Patient Spontanous Breathing and Patient connected to nasal cannula oxygen  Post-op Assessment: Report given to RN, Post -op Vital signs reviewed and stable and Patient moving all extremities X 4  Post vital signs: Reviewed and stable  Last Vitals:  Filed Vitals:   12/11/15 0607 12/11/15 0611  BP:  167/74  Pulse:  56  Temp: 36.4 C   Resp: 16     Complications: No apparent anesthesia complications

## 2015-12-11 NOTE — H&P (Signed)
Patient name: Gary Valencia: [id number] DOB: [birth year]Sex: male    Chief Complaint  Patient presents with  . Re-evaluation    2 wk f/u CTA chest/abd/pelvis - carotid done here 02/13    HISTORY OF PRESENT ILLNESS: the patient is back today for follow-up of his abdominal aortic aneurysm which measures 5.2 cm by ultrasound. He recently had a CT scan and is here to discuss this with his wife and son. He has had no interval changes.  The patient has a history of CABG, 33 years ago. He suffers from atrial fibrillation which is managed with anticoagulation. He is medically managed for hypertension. He takes a statin for hypercholesterolemia. He is on single agent antiplatelet therapy he is a nonsmoker  Past Medical History  Diagnosis Date  . ASHD (arteriosclerotic heart disease) 1984  . Cancer ([place]) 1999    PROSTATE  . Personal history of colonic polyps   . Dyslipidemia   . AAA (abdominal aortic aneurysm) ([place])   . Diverticulosis   . Actinic keratoses   . Anxiety   . Atrial fibrillation [facility])     Past Surgical History  Procedure Laterality Date  . Odontoid fracture surgery      Odontoid screw fixation of type 2 odontoid fracture, open reduction and internal fixation of the fracture--patient had fallen down the stairs at 82 years old  . Other surgical history  01/29/1998    Stage TIC adenocarcinoma of the prostate--Surgeon--David, Grapey M.D.--Interstital seed implantation with I-125 seeds--PSA of approximately 7    Social History   Social History  . Marital Status: Married    Spouse Name: N/A  . Number of Children: N/A  . Years of Education: N/A   Occupational History  . Not on file.   Social History Main Topics  . Smoking status: Never Smoker   . Smokeless tobacco: Never Used  . Alcohol Use: No  . Drug Use: No  . Sexual  Activity: Not on file   Other Topics Concern  . Not on file   Social History Narrative    Family History  Problem Relation Age of Onset  . Arthritis Mother   . Heart disease Father   . AAA (abdominal aortic aneurysm) Brother     Allergies as of 10/20/2015 - Review Complete 10/20/2015  Allergen Reaction Noted  . Asa buff (mag [buffered aspirin]  06/23/2011  . Zetia [ezetimibe]  06/23/2011    Current Outpatient Prescriptions on File Prior to Visit  Medication Sig Dispense Refill  . Cod Liver Oil 1250-135 UNITS CAPS Take 2 capsules by mouth daily.     . metoprolol tartrate (LOPRESSOR) 25 MG tablet Take 0.5 tablets (12.5 mg total) by mouth 2 (two) times daily. 90 tablet 2  . Multiple Vitamin (MULITIVITAMIN WITH MINERALS) TABS Take 1 tablet by mouth daily.    . rosuvastatin (CRESTOR) 10 MG tablet Take 1 tablet (10 mg total) by mouth daily. 90 tablet 2  . warfarin (COUMADIN) 5 MG tablet TAKE 1 TABLET DAILY OR AS DIRECTED BY COUMADIN CLINIC 90 tablet 1   No current facility-administered medications on file prior to visit.     REVIEW OF SYSTEMS: please see history of present illness. There is been no interval changes since his last visit 2 weeks ago   PHYSICAL EXAMINATION:  Vital signs are  Filed Vitals:   10/20/15 1447 10/20/15 1448  BP: 161/86 155/84  Pulse: 58   Height: 5\' 7"  (1.702 m)   Weight: 149 lb 4.8 oz (  67.722 kg)   SpO2: 98%    Body mass index is 23.38 kg/(m^2). General: The patient appears their stated age. HEENT: No gross abnormalities Pulmonary: Non labored breathing Abdomen: Soft and non-tender Musculoskeletal: There are no major deformities. Neurologic: No focal weakness or paresthesias are detected, Skin: There are no ulcer or rashes noted. Psychiatric: The patient has normal affect. Cardiovascular: There is a regular rate and rhythm without  significant murmur appreciated.   Diagnostic Studies I have reviewed his CT scan with the following findings: 1. 5.1 cm infrarenal abdominal aortic aneurysm. 2. 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 3. Four-chamber cardiac enlargement with suggestion of pulmonary hypertension. 4. Cholelithiasis 5. Sigmoid diverticulosis  The patient carotid Doppler studies today which were normal  Assessment: Abdominal aortic aneurysm Plan: I had a lengthy discussion with the patient's wife and son regarding how to proceed with management of his aneurysm, given his age. After well-informed conversation, we have collectively decided to proceed with endovascular repair. I did discuss all the risks and benefits of surgery including the risk of death, cardiac complications, renal ischemia, intestinal ischemia, lower extremity embolic disease. They are willing to proceed and have selected April 20 procedure surgery. The patient will need to be off of his Coumadin for 5 days prior to his operation.  Eldridge Abrahams, M.D. [facility] [place] Office: [phone] Pager: [phone]         No interval changes CV:RRR PULM:CTA Neuro intact PLAN:  EVAR for AAA  WElls Gary Valencia

## 2015-12-11 NOTE — Progress Notes (Signed)
  Day of Surgery Note    Subjective:  "I'm doing good"  Filed Vitals:   12/11/15 1100 12/11/15 1104  BP:  171/85  Pulse: 59 60  Temp:    Resp: 13 16    Incisions:   Bilateral groins with sandbags in place Extremities:  Bilateral feet are warm with palpable DP pulses bilaterally Lungs:  Non labored Abdomen:  Soft, NT/ND  Assessment/Plan:  This is a 82 y.o. male who is s/p  #1: Endovascular repair of abdominal aortic aneurysm #2: Bilateral ultrasound-guided percutaneous access, common femoral artery #3: Catheter aorta 2 #4: Abdominal aortogram #5: Distal extension 1   -pt doing well in pacu -sandbags are in place in his groins -palpable DP pulses bilaterally -to 3 south when bed available   Leontine Locket, PA-C 12/11/2015 12:01 PM

## 2015-12-12 ENCOUNTER — Encounter (HOSPITAL_COMMUNITY): Payer: Self-pay | Admitting: Surgery

## 2015-12-12 ENCOUNTER — Telehealth: Payer: Self-pay | Admitting: Surgery

## 2015-12-12 LAB — CBC
HCT: 32 % — ABNORMAL LOW (ref 39.0–52.0)
Hemoglobin: 10 g/dL — ABNORMAL LOW (ref 13.0–17.0)
MCH: 29.9 pg (ref 26.0–34.0)
MCHC: 31.3 g/dL (ref 30.0–36.0)
MCV: 95.8 fL (ref 78.0–100.0)
PLATELETS: 96 10*3/uL — AB (ref 150–400)
RBC: 3.34 MIL/uL — AB (ref 4.22–5.81)
RDW: 16 % — ABNORMAL HIGH (ref 11.5–15.5)
WBC: 5.2 10*3/uL (ref 4.0–10.5)

## 2015-12-12 LAB — BASIC METABOLIC PANEL
ANION GAP: 8 (ref 5–15)
BUN: 14 mg/dL (ref 6–20)
CALCIUM: 8.7 mg/dL — AB (ref 8.9–10.3)
CO2: 25 mmol/L (ref 22–32)
Chloride: 104 mmol/L (ref 101–111)
Creatinine, Ser: 1.16 mg/dL (ref 0.61–1.24)
GFR calc Af Amer: >60 mL/min (ref >60)
GFR, EST NON AFRICAN AMERICAN: 53 mL/min — AB (ref >60)
Glucose, Bld: 94 mg/dL (ref 65–99)
POTASSIUM: 4.1 mmol/L (ref 3.5–5.1)
SODIUM: 137 mmol/L (ref 135–145)

## 2015-12-12 LAB — PROTIME-INR
INR: 1.28 (ref 0.00–1.49)
PROTHROMBIN TIME: 16.1 s — AB (ref 11.6–15.2)

## 2015-12-12 NOTE — Telephone Encounter (Signed)
sched appts 5/22, cta at [place] at 55 and md at 1. Spoke to pt and wife to inform them of appt.

## 2015-12-12 NOTE — Progress Notes (Signed)
Discharge Note. Educated andReviewed discharge papers with pt and wife at bedside. This included medications, follow up appointments, activity restrictions, and when to call the doctor. Discontinued peripheral IVs. Pt. Discharged via wheelchair and volunteer. Leonie Man, RN

## 2015-12-12 NOTE — Progress Notes (Signed)
[facility] [place]  Subjective  - Doing well.  He has voided and is sitting up in the chair.  No complaints.   Objective 132/76 57 97.9 F (36.6 C) (Oral) 16 97%  Intake/Output Summary (Last 24 hours) at 12/12/15 0725 Last data filed at 12/12/15 [id number]  Gross per 24 hour  Intake 2272.5 ml  Output   1545 ml  Net  727.5 ml    Palpable DP pulses bilaterally Groins soft without hematoma Heart RRR Lungs non labored breathing  Assessment/Planning: POD # 1 EVAR  Cr WNL Pending breakfast and ambulating he will be discharged home  Laurence Slate [facility] 12/12/2015 7:25 AM --  Laboratory Lab Results:  Recent Labs  12/11/15 1116 12/12/15 0430  WBC 5.1 5.2  HGB 10.9* 10.0*  HCT 33.6* 32.0*  PLT 102* 96*   BMET  Recent Labs  12/11/15 1116 12/12/15 0430  NA 140 137  K 4.1 4.1  CL 107 104  CO2 23 25  GLUCOSE 91 94  BUN 15 14  CREATININE 1.13 1.16  CALCIUM 8.5* 8.7*    COAG Lab Results  Component Value Date   INR 1.28 12/12/2015   INR 1.47 12/11/2015   INR 1.27 12/11/2015   No results found for: PTT

## 2015-12-12 NOTE — Telephone Encounter (Signed)
-----   Message from Mena Goes, RN sent at 12/11/2015  3:16 PM EDT ----- Regarding: schedule   ----- Message -----    From: Alvia Grove, PA-C    Sent: 12/11/2015  10:21 AM      To: Vvs Charge Pool  S/p EVAR 12/11/15  F/u with Dr. Trula Slade in 4 weeks with CTA  Thanks Maudie Mercury

## 2015-12-15 ENCOUNTER — Ambulatory Visit (INDEPENDENT_AMBULATORY_CARE_PROVIDER_SITE_OTHER): Payer: Medicare Other | Admitting: Pharmacist

## 2015-12-15 DIAGNOSIS — I482 Chronic atrial fibrillation, unspecified: Secondary | ICD-10-CM

## 2015-12-15 DIAGNOSIS — Z7901 Long term (current) use of anticoagulants: Secondary | ICD-10-CM | POA: Diagnosis not present

## 2015-12-15 LAB — POCT INR: INR: 1.1

## 2015-12-15 NOTE — Discharge Summary (Signed)
[facility] Discharge Summary   Patient ID:  Gary Valencia MRN: [id number] DOB/AGE: 80-May-1924 80 y.o.  Admit date: 12/11/2015 Discharge date: 12/12/2015 Date of Surgery: 12/11/2015 Surgeon: Surgeon(s): Serafina Mitchell, MD  Admission Diagnosis: Abdominal aortic aneurysm I71.4  Discharge Diagnoses:  Abdominal aortic aneurysm I71.4  Secondary Diagnoses: Past Medical History  Diagnosis Date  . ASHD (arteriosclerotic heart disease) 1984  . Cancer ([place]) 1999    PROSTATE  . Personal history of colonic polyps   . Dyslipidemia   . AAA (abdominal aortic aneurysm) ([place])   . Diverticulosis   . Actinic keratoses   . Anxiety   . Atrial fibrillation ([place])   . Dysrhythmia     Procedure(s): ABDOMINAL AORTIC ENDOVASCULAR STENT GRAFT  Discharged Condition: good  HPI:  The patient is back today for follow-up of his abdominal aortic aneurysm which measures 5.2 cm by ultrasound. He recently had a CT scan and is here to discuss this with his wife and son. He has had no interval changes.  The patient has a history of CABG, 33 years ago. He suffers from atrial fibrillation which is managed with anticoagulation. He is medically managed for hypertension. He takes a statin for hypercholesterolemia. He is on single agent antiplatelet therapy he is a nonsmoker  Hospital Course:  Gary Valencia is a 82 y.o. male is S/P  Procedure(s): ABDOMINAL AORTIC ENDOVASCULAR STENT GRAFT  Palpable DP pulses bilaterally Groins soft without hematoma Heart RRR Lungs non labored breathing  Assessment/Planning: POD # 1 EVAR  Cr WNL Pending breakfast and ambulating he will be discharged home  Stable disposition for discharge    Significant Diagnostic Studies: CBC Lab Results  Component Value Date   WBC 5.2 12/12/2015   HGB 10.0* 12/12/2015   HCT 32.0* 12/12/2015   MCV 95.8 12/12/2015   PLT 96* 12/12/2015    BMET    Component Value Date/Time   NA 137  12/12/2015 0430   K 4.1 12/12/2015 0430   CL 104 12/12/2015 0430   CO2 25 12/12/2015 0430   GLUCOSE 94 12/12/2015 0430   BUN 14 12/12/2015 0430   CREATININE 1.16 12/12/2015 0430   CREATININE 1.32 03/29/2014 0939   CALCIUM 8.7* 12/12/2015 0430   GFRNONAA 53* 12/12/2015 0430   GFRAA >60 12/12/2015 0430   COAG Lab Results  Component Value Date   INR 1.1 12/15/2015   INR 1.28 12/12/2015   INR 1.47 12/11/2015     Disposition:  Discharge to :Home Discharge Instructions    Call MD for:  redness, tenderness, or signs of infection (pain, swelling, bleeding, redness, odor or green/yellow discharge around incision site)    Complete by:  As directed      Call MD for:  severe or increased pain, loss or decreased feeling  in affected limb(s)    Complete by:  As directed      Call MD for:  temperature >100.5    Complete by:  As directed      Discharge patient    Complete by:  As directed   Discharge pt to home     Discharge wound care:    Complete by:  As directed   Wash wounds daily with soap and water and pat dry.  Do not apply any creams or ointments on your incisions. You do not have to reapply a dressing.     Driving Restrictions    Complete by:  As directed   No driving for 2 weeks  Increase activity slowly    Complete by:  As directed   Walk with assistance use walker or cane as needed     Lifting restrictions    Complete by:  As directed   No lifting for 4 weeks     Resume previous diet    Complete by:  As directed             Medication List    TAKE these medications        Cod Liver Oil 1250-135 units Caps  Take 2 capsules by mouth daily.     metoprolol tartrate 25 MG tablet  Commonly known as:  LOPRESSOR  Take 0.5 tablets (12.5 mg total) by mouth 2 (two) times daily.     multivitamin with minerals Tabs tablet  Take 1 tablet by mouth daily.     oxyCODONE-acetaminophen 5-325 MG tablet  Commonly known as:  ROXICET  Take 1 tablet by mouth every 6 (six)  hours as needed for severe pain.     rosuvastatin 10 MG tablet  Commonly known as:  CRESTOR  Take 1 tablet (10 mg total) by mouth daily.     warfarin 5 MG tablet  Commonly known as:  COUMADIN  TAKE 1 TABLET DAILY OR AS DIRECTED BY COUMADIN CLINIC       Verbal and written Discharge instructions given to the patient. Wound care per Discharge AVS     Follow-up Information    Follow up with Annamarie Major, MD In 4 weeks.   Specialties:  Vascular Surgery, Cardiology   Why:  Our office will call you to arrange an appointment (sent)   Contact information:   [place] [address] [phone]       Follow up with Tommy Medal, [facility] On 12/15/2015.   Why:  for coumadin (warfarin) check @ 12:10 pm (spoke with [facility]    Contact information:   [address], [place], [place] [address]      Signed: Laurence Slate [facility] 12/15/2015, 3:54 PM  - For [facility] use --- Instructions: Press F2 to tab through selections.  Delete question if not applicable.   Post-op:  Time to Extubation: [x ] In OR, [ ]  < 12 hrs, [ ]  12-24 hrs, [ ]  >=24 hrs Vasopressors Req. Post-op: No MI: [x ] No, [ ]  Troponin only, [ ]  EKG or Clinical New Arrhythmia: No CHF: No ICU Stay:  days Transfusion: No  If yes,  units given  Complications: Resp failure: [x ] none, [ ]  Pneumonia, [ ]  Ventilator Chg in renal function: [x ] none, [ ]  Inc. Cr > 0.5, [ ]  Temp. Dialysis, [ ]  Permanent dialysis Leg ischemia: [x ] No, [ ]  Yes, no Surgery needed, [ ]  Yes, Surgery needed, [ ]  Amputation Bowel ischemia: [x ] No, [ ]  Medical Rx, [ ]  Surgical Rx Wound complication: [x ] No, [ ]  Superficial separation/infection, [ ]  Return to OR Return to OR: No  Return to OR for bleeding: No Stroke: [x ] None, [ ]  Minor, [ ]  Major  Discharge medications: Statin use:  Yes ASA use:  No  for medical reason   Plavix use:  No  for medical reason   Beta blocker use:  Yes  Coumadin yes

## 2015-12-29 ENCOUNTER — Ambulatory Visit (INDEPENDENT_AMBULATORY_CARE_PROVIDER_SITE_OTHER): Payer: Medicare Other | Admitting: Pharmacist Clinician (PhC)/ Clinical Pharmacy Specialist

## 2015-12-29 ENCOUNTER — Other Ambulatory Visit: Payer: Self-pay | Admitting: Pharmacist Clinician (PhC)/ Clinical Pharmacy Specialist

## 2015-12-29 DIAGNOSIS — I482 Chronic atrial fibrillation, unspecified: Secondary | ICD-10-CM

## 2015-12-29 DIAGNOSIS — Z7901 Long term (current) use of anticoagulants: Secondary | ICD-10-CM | POA: Diagnosis not present

## 2015-12-29 LAB — POCT INR: INR: 2.4

## 2015-12-29 MED ORDER — ROSUVASTATIN CALCIUM 10 MG PO TABS: 10.0000 mg | ORAL_TABLET | Freq: Every day | ORAL | Status: DC

## 2015-12-29 MED ORDER — METOPROLOL TARTRATE 25 MG PO TABS: 12.5000 mg | ORAL_TABLET | Freq: Two times a day (BID) | ORAL | Status: DC

## 2016-01-05 ENCOUNTER — Encounter: Payer: Medicare Other | Admitting: Pharmacist Clinician (PhC)/ Clinical Pharmacy Specialist

## 2016-01-06 ENCOUNTER — Encounter: Payer: Self-pay | Admitting: Surgery

## 2016-01-12 ENCOUNTER — Ambulatory Visit (INDEPENDENT_AMBULATORY_CARE_PROVIDER_SITE_OTHER): Payer: Medicare Other | Admitting: Surgery

## 2016-01-12 ENCOUNTER — Ambulatory Visit
Admit: 2016-01-12 | Discharge: 2016-01-12 | Disposition: A | Discharge status: Home / Self Care | Payer: Medicare Other | Attending: Surgery | Admitting: Surgery

## 2016-01-12 ENCOUNTER — Encounter: Payer: Self-pay | Admitting: Surgery

## 2016-01-12 VITALS — BP 147/84 | HR 77 | Ht 67.0 in | Wt 152.0 lb

## 2016-01-12 DIAGNOSIS — Z95828 Presence of other vascular implants and grafts: Secondary | ICD-10-CM

## 2016-01-12 DIAGNOSIS — I714 Abdominal aortic aneurysm, without rupture, unspecified: Secondary | ICD-10-CM

## 2016-01-12 MED ORDER — IOPAMIDOL (ISOVUE-370) INJECTION 76%
75.0000 mL | Freq: Once | INTRAVENOUS | Status: AC | PRN
  Administered 2016-01-12: 75 mL via INTRAVENOUS

## 2016-01-12 NOTE — Progress Notes (Signed)
   Patient name: Gary Valencia MRN: [id number] DOB: [birth year] Sex: male  REASON FOR VISIT: Post-op  HPI: Gary Valencia is a 82 y.o. male who returns today for follow-up.  He is status post endovascular aneurysm repair on 12/11/2015.  His postoperative course was uncomplicated.  He has no complaints today.  Current Outpatient Prescriptions  Medication Sig Dispense Refill  . Cod Liver Oil 1250-135 UNITS CAPS Take 2 capsules by mouth daily.     . metoprolol tartrate (LOPRESSOR) 25 MG tablet Take 0.5 tablets (12.5 mg total) by mouth 2 (two) times daily. 90 tablet 2  . Multiple Vitamin (MULITIVITAMIN WITH MINERALS) TABS Take 1 tablet by mouth daily.    Marland Kitchen oxyCODONE-acetaminophen (ROXICET) 5-325 MG tablet Take 1 tablet by mouth every 6 (six) hours as needed for severe pain. 15 tablet 0  . rosuvastatin (CRESTOR) 10 MG tablet Take 1 tablet (10 mg total) by mouth daily. 90 tablet 2  . warfarin (COUMADIN) 5 MG tablet TAKE 1 TABLET DAILY OR AS DIRECTED BY COUMADIN CLINIC 90 tablet 1   No current facility-administered medications for this visit.    REVIEW OF SYSTEMS:  [X]  denotes positive finding, [ ]  denotes negative finding Cardiac  Comments:  Chest pain or chest pressure:    Shortness of breath upon exertion:    Short of breath when lying flat:    Irregular heart rhythm:    Constitutional    Fever or chills:      PHYSICAL EXAM: There were no vitals filed for this visit.  GENERAL: The patient is a well-nourished male, in no acute distress. The vital signs are documented above. CARDIOVASCULAR: There is a regular rate and rhythm. PULMONARY: There is good air exchange bilaterally without wheezing or rales. Bilateral groin incisions are healing properly.   Data: I have reviewed his CT scan which shows that the stent graft is in good position.  There is a small type II endoleak.  Native aneurysm sac diameter has slightly decreased, now measuring 4.9 cm  MEDICAL ISSUES: Status post  endovascular aneurysm repair.  Patient is recovering nicely.  He does have a small type II endoleak on CT scan.  I will have a follow-up in 6 months with a repeat CT scan to evaluate the aortic diameter as well as the type II endoleak.  Preoperative carotid Doppler studies were within normal limits, therefore I will not reorder these.  Annamarie Major MD [facility] [place] Tel: [phone] Pager: [phone]

## 2016-01-27 ENCOUNTER — Ambulatory Visit (INDEPENDENT_AMBULATORY_CARE_PROVIDER_SITE_OTHER): Payer: Medicare Other | Admitting: Pharmacist Clinician (PhC)/ Clinical Pharmacy Specialist

## 2016-01-27 DIAGNOSIS — I482 Chronic atrial fibrillation, unspecified: Secondary | ICD-10-CM

## 2016-01-27 DIAGNOSIS — Z7901 Long term (current) use of anticoagulants: Secondary | ICD-10-CM | POA: Diagnosis not present

## 2016-01-27 LAB — POCT INR: INR: 2.6

## 2016-03-10 ENCOUNTER — Ambulatory Visit (INDEPENDENT_AMBULATORY_CARE_PROVIDER_SITE_OTHER): Payer: Medicare Other | Admitting: Pharmacist

## 2016-03-10 DIAGNOSIS — I482 Chronic atrial fibrillation, unspecified: Secondary | ICD-10-CM

## 2016-03-10 DIAGNOSIS — Z7901 Long term (current) use of anticoagulants: Secondary | ICD-10-CM

## 2016-03-10 LAB — POCT INR: INR: 2.4

## 2016-03-23 ENCOUNTER — Other Ambulatory Visit: Payer: Self-pay | Admitting: *Deleted

## 2016-03-23 DIAGNOSIS — I714 Abdominal aortic aneurysm, without rupture, unspecified: Secondary | ICD-10-CM

## 2016-04-15 ENCOUNTER — Ambulatory Visit (INDEPENDENT_AMBULATORY_CARE_PROVIDER_SITE_OTHER): Payer: Medicare Other | Admitting: Pharmacist Clinician (PhC)/ Clinical Pharmacy Specialist

## 2016-04-15 DIAGNOSIS — I482 Chronic atrial fibrillation, unspecified: Secondary | ICD-10-CM

## 2016-04-15 DIAGNOSIS — Z7901 Long term (current) use of anticoagulants: Secondary | ICD-10-CM | POA: Diagnosis not present

## 2016-04-15 LAB — POCT INR: INR: 2.4

## 2016-05-06 ENCOUNTER — Other Ambulatory Visit (INDEPENDENT_AMBULATORY_CARE_PROVIDER_SITE_OTHER): Payer: Medicare Other

## 2016-05-06 DIAGNOSIS — Z23 Encounter for immunization: Secondary | ICD-10-CM | POA: Diagnosis not present

## 2016-05-25 ENCOUNTER — Ambulatory Visit (INDEPENDENT_AMBULATORY_CARE_PROVIDER_SITE_OTHER): Payer: Medicare Other | Admitting: Pharmacist Clinician (PhC)/ Clinical Pharmacy Specialist

## 2016-05-25 DIAGNOSIS — Z7901 Long term (current) use of anticoagulants: Secondary | ICD-10-CM | POA: Diagnosis not present

## 2016-05-25 DIAGNOSIS — I482 Chronic atrial fibrillation, unspecified: Secondary | ICD-10-CM

## 2016-05-25 LAB — POCT INR: INR: 2.3

## 2016-07-07 ENCOUNTER — Ambulatory Visit (INDEPENDENT_AMBULATORY_CARE_PROVIDER_SITE_OTHER): Payer: Medicare Other | Admitting: Pharmacist

## 2016-07-07 ENCOUNTER — Encounter: Payer: Self-pay | Admitting: Surgery

## 2016-07-07 DIAGNOSIS — Z7901 Long term (current) use of anticoagulants: Secondary | ICD-10-CM

## 2016-07-07 DIAGNOSIS — I482 Chronic atrial fibrillation, unspecified: Secondary | ICD-10-CM

## 2016-07-07 LAB — POCT INR: INR: 2.2

## 2016-07-19 ENCOUNTER — Ambulatory Visit
Admission: RE | Admit: 2016-07-19 | Discharge: 2016-07-19 | Disposition: A | Discharge status: Home / Self Care | Payer: Medicare Other | Source: Ambulatory Visit | Attending: Surgery | Admitting: Surgery

## 2016-07-19 ENCOUNTER — Ambulatory Visit: Payer: Medicare Other | Admitting: Surgery

## 2016-07-19 DIAGNOSIS — I714 Abdominal aortic aneurysm, without rupture, unspecified: Secondary | ICD-10-CM

## 2016-07-19 MED ORDER — IOPAMIDOL (ISOVUE-370) INJECTION 76%
75.0000 mL | Freq: Once | INTRAVENOUS | Status: AC | PRN
  Administered 2016-07-19: 75 mL via INTRAVENOUS

## 2016-08-06 ENCOUNTER — Encounter: Payer: Self-pay | Admitting: Cardiovascular Disease

## 2016-08-06 ENCOUNTER — Ambulatory Visit (INDEPENDENT_AMBULATORY_CARE_PROVIDER_SITE_OTHER): Payer: Medicare Other | Admitting: Cardiovascular Disease

## 2016-08-06 ENCOUNTER — Ambulatory Visit (INDEPENDENT_AMBULATORY_CARE_PROVIDER_SITE_OTHER): Payer: Medicare Other | Admitting: Pharmacist

## 2016-08-06 VITALS — BP 126/82 | HR 63 | Ht 68.0 in | Wt 156.2 lb

## 2016-08-06 DIAGNOSIS — Z79899 Other long term (current) drug therapy: Secondary | ICD-10-CM | POA: Diagnosis not present

## 2016-08-06 DIAGNOSIS — I482 Chronic atrial fibrillation, unspecified: Secondary | ICD-10-CM

## 2016-08-06 DIAGNOSIS — Z7901 Long term (current) use of anticoagulants: Secondary | ICD-10-CM | POA: Diagnosis not present

## 2016-08-06 DIAGNOSIS — I714 Abdominal aortic aneurysm, without rupture, unspecified: Secondary | ICD-10-CM

## 2016-08-06 DIAGNOSIS — I2510 Atherosclerotic heart disease of native coronary artery without angina pectoris: Secondary | ICD-10-CM | POA: Diagnosis not present

## 2016-08-06 DIAGNOSIS — E785 Hyperlipidemia, unspecified: Secondary | ICD-10-CM | POA: Diagnosis not present

## 2016-08-06 LAB — LIPID PANEL
CHOL/HDL RATIO: 2.3 ratio (ref <5.0)
Cholesterol: 159 mg/dL (ref <200)
HDL: 68 mg/dL (ref >40)
LDL Cholesterol: 77 mg/dL (ref <100)
Triglycerides: 71 mg/dL (ref <150)
VLDL: 14 mg/dL (ref <30)

## 2016-08-06 LAB — COMPREHENSIVE METABOLIC PANEL
ALT: 11 U/L (ref 9–46)
AST: 25 U/L (ref 10–35)
Albumin: 3.5 g/dL — ABNORMAL LOW (ref 3.6–5.1)
Alkaline Phosphatase: 122 U/L — ABNORMAL HIGH (ref 40–115)
BUN: 24 mg/dL (ref 7–25)
CHLORIDE: 104 mmol/L (ref 98–110)
CO2: 27 mmol/L (ref 20–31)
Calcium: 9.1 mg/dL (ref 8.6–10.3)
Creat: 1.22 mg/dL — ABNORMAL HIGH (ref 0.70–1.11)
GLUCOSE: 90 mg/dL (ref 65–99)
POTASSIUM: 5 mmol/L (ref 3.5–5.3)
Sodium: 139 mmol/L (ref 135–146)
Total Bilirubin: 0.5 mg/dL (ref 0.2–1.2)
Total Protein: 7 g/dL (ref 6.1–8.1)

## 2016-08-06 LAB — CBC
HEMATOCRIT: 32.8 % — AB (ref 38.5–50.0)
Hemoglobin: 10.4 g/dL — ABNORMAL LOW (ref 13.2–17.1)
MCH: 29.1 pg (ref 27.0–33.0)
MCHC: 31.7 g/dL — ABNORMAL LOW (ref 32.0–36.0)
MCV: 91.9 fL (ref 80.0–100.0)
MPV: 9.8 fL (ref 7.5–12.5)
Platelets: 170 10*3/uL (ref 140–400)
RBC: 3.57 MIL/uL — AB (ref 4.20–5.80)
RDW: 17 % — ABNORMAL HIGH (ref 11.0–15.0)
WBC: 5.3 10*3/uL (ref 3.8–10.8)

## 2016-08-06 LAB — POCT INR: INR: 2.2

## 2016-08-06 NOTE — Progress Notes (Signed)
Cardiology Office Note    Date:  08/07/2016   ID:  ARAMIS CRITCHLOW, DOB 1923/03/28, MRN [id number]  PCP:  Wyatt Haste, MD  Cardiologist:   Sanda Klein, MD   Chief Complaint  Patient presents with  . Follow-up    History of Present Illness:  Gary Valencia is a 82 y.o. male with coronary artery disease who is now more than 30 years after bypass surgery, without need for revascularization since. He has chronic atrial fibrillation and tolerates warfarin anticoagulation without bleeding problems and without frequent need for dose adjustment. He has not had any neurological events.  He successfully underwent nonsurgical exclusion of his abdominal aortic aneurysm with Dr. Trula Slade in April 2017. 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 The aneurysm sac has been decreasing in size despite the presence of the endoleak.  He feels well. He is unaware of palpitations. He denies shortness of breath at rest or with activity and continues to work in his yard. He has not had dizzy spells or syncope and denies abdominal pain, angina pectoris, intermittent claudication. He has minimal intermittent ankle edema.  Past Medical History:  Diagnosis Date  . AAA (abdominal aortic aneurysm) ([place])   . Actinic keratoses   . Anxiety   . ASHD (arteriosclerotic heart disease) 1984  . Atrial fibrillation ([place])   . Cancer ([place]) 1999   PROSTATE  . Diverticulosis   . Dyslipidemia   . Dysrhythmia   . Personal history of colonic polyps     Past Surgical History:  Procedure Laterality Date  . ABDOMINAL AORTIC ENDOVASCULAR STENT GRAFT N/A 12/11/2015   Procedure: ABDOMINAL AORTIC ENDOVASCULAR STENT GRAFT;  Surgeon: Serafina Mitchell, MD;  Location: [facility];  Service: Vascular;  Laterality: N/A;  . CORONARY ARTERY BYPASS GRAFT  1984   [facility]- Dr. Redmond Pulling x4  . EYE SURGERY Bilateral    cataracts  . ODONTOID FRACTURE SURGERY     Odontoid screw fixation of type 2 odontoid fracture, open reduction and internal fixation of the fracture--patient had fallen down the stairs at 82 years old  . OTHER SURGICAL HISTORY  01/29/1998   Stage TIC adenocarcinoma of the prostate--Surgeon--David, Grapey M.D.--Interstital seed implantation with I-125 seeds--PSA of approximately 7  . PROSTATE SURGERY  approx 2003   [facility] -Grapey  . TONSILLECTOMY      Current Medications: Outpatient Medications Prior to Visit  Medication Sig Dispense Refill  . Cod Liver Oil 1250-135 UNITS CAPS Take 2 capsules by mouth daily.     . metoprolol tartrate (LOPRESSOR) 25 MG tablet Take 0.5 tablets (12.5 mg total) by mouth 2 (two) times daily. 90 tablet 2  . Multiple Vitamin (MULITIVITAMIN WITH MINERALS) TABS Take 1 tablet by mouth daily.    Marland Kitchen oxyCODONE-acetaminophen (ROXICET) 5-325 MG tablet Take 1 tablet by mouth every 6 (six) hours as needed for severe pain. (Patient not taking: Reported on 01/12/2016) 15 tablet 0  . rosuvastatin (CRESTOR) 10 MG tablet Take 1 tablet (10 mg total) by mouth daily. 90 tablet 2  . warfarin (COUMADIN) 5 MG tablet TAKE 1 TABLET DAILY OR AS DIRECTED BY COUMADIN CLINIC 90 tablet 1   No facility-administered medications prior to visit.      Allergies:   Asa buff (mag [buffered aspirin] and Zetia [ezetimibe]   Social History   Social History  . Marital status: Married    Spouse name: N/A  . Number of children:  N/A  . Years of education: N/A   Social History Main Topics  . Smoking status: Never Smoker  . Smokeless tobacco: Never Used  . Alcohol use No  . Drug use: No  . Sexual activity: Not Asked   Other Topics Concern  . None   Social History Narrative  . None     Family History:  The patient's family history includes AAA (abdominal aortic aneurysm) in his brother; Arthritis in his mother; Heart disease in his father.   ROS:   Please see the history of present illness.    ROS All other systems reviewed and  are negative.   PHYSICAL EXAM:   VS:  BP 126/82   Pulse 63   Ht 5\' 8"  (1.727 m)   Wt 156 lb 3.2 oz (70.9 kg)   BMI 23.75 kg/m    GEN: Well nourished, well developed, in no acute distress  HEENT: normal  Neck: no JVD, carotid bruits, or masses Cardiac: irregular; no murmurs, rubs, or gallops,no edema  Respiratory:  clear to auscultation bilaterally, normal work of breathing GI: soft, nontender, nondistended, + BS MS: no deformity or atrophy  Skin: warm and dry, no rash Neuro:  Alert and Oriented x 3, Strength and sensation are intact Psych: euthymic mood, full affect  Wt Readings from Last 3 Encounters:  08/06/16 156 lb 3.2 oz (70.9 kg)  01/12/16 152 lb (68.9 kg)  12/11/15 151 lb 3.8 oz (68.6 kg)      Studies/Labs Reviewed:   EKG:  EKG is ordered today.  The ekg ordered today demonstrates Atrial fibrillation with controlled rate, QTC 448 ms  Recent Labs: 12/11/2015: Magnesium 2.2 08/06/2016: ALT 11; BUN 24; Creat 1.22; Hemoglobin 10.4; Platelets 170; Potassium 5.0; Sodium 139   Lipid Panel    Component Value Date/Time   CHOL 159 08/06/2016 1004   TRIG 71 08/06/2016 1004   HDL 68 08/06/2016 1004   CHOLHDL 2.3 08/06/2016 1004   VLDL 14 08/06/2016 1004   LDLCALC 77 08/06/2016 1004    Additional studies/ records that were reviewed today include:  Notes from Dr. Trula Slade CT images    ASSESSMENT:    1. Atrial fibrillation, permanent   2. Coronary artery disease involving native coronary artery of native heart without angina pectoris   3. AAA (abdominal aortic aneurysm) without rupture ([place])   4. Dyslipidemia   5. Medication management      PLAN:  In order of problems listed above:  1. AFib: Asymptomatic, well rate controlled, no bleeding complications on warfarin anticoagulation with consistently therapeutic INR. 2. CAD s/p CABG 1984: Asymptomatic. Last functional study was a normal nuclear perfusion test in 2012. 3. AAA s/p EVAR: Aneurysm sac is decreasing  in size despite the presence of a small type II endoleak. I suspect Dr. Trula Slade would recommend continued conservative management. 4. HLP: Time for repeat lipid profile    Medication Adjustments/Labs and Tests Ordered: Current medicines are reviewed at length with the patient today.  Concerns regarding medicines are outlined above.  Medication changes, Labs and Tests ordered today are listed in the Patient Instructions below. Patient Instructions  Medication Instructions: Dr Sallyanne Kuster recommends that you continue on your current medications as directed. Please refer to the Current Medication list given to you today.  Labwork: Your physician recommends that you return for lab work at your convenience - FASTING.  Testing/Procedures: NONE ORDERED  Follow-up: Dr Sallyanne Kuster recommends that you schedule a follow-up appointment in 12 months. You will receive a reminder  letter in the mail two months in advance. If you don't receive a letter, please call our office to schedule the follow-up appointment.  If you need a refill on your cardiac medications before your next appointment, please call your pharmacy.    Signed, Sanda Klein, MD  08/07/2016 12:19 PM    [address], [place], [place]  [address] Phone: [phone]; Fax: [phone]

## 2016-08-06 NOTE — Patient Instructions (Signed)
Medication Instructions: Dr [name] recommends that you continue on your current medications as directed. Please refer to the Current Medication list given to you today.  Labwork: Your physician recommends that you return for lab work at your convenience - FASTING.  Testing/Procedures: NONE ORDERED  Follow-up: Dr [name] recommends that you schedule a follow-up appointment in 12 months. You will receive a reminder letter in the mail two months in advance. If you don't receive a letter, please call our office to schedule the follow-up appointment.  If you need a refill on your cardiac medications before your next appointment, please call your pharmacy. 

## 2016-08-12 ENCOUNTER — Other Ambulatory Visit: Payer: Self-pay | Admitting: *Deleted

## 2016-08-12 DIAGNOSIS — Z79899 Other long term (current) drug therapy: Secondary | ICD-10-CM

## 2016-08-13 ENCOUNTER — Encounter: Payer: Self-pay | Admitting: Surgery

## 2016-08-27 ENCOUNTER — Other Ambulatory Visit: Payer: Self-pay | Admitting: Pharmacist Clinician (PhC)/ Clinical Pharmacy Specialist

## 2016-08-27 MED ORDER — WARFARIN SODIUM 5 MG PO TABS: ORAL_TABLET | ORAL | 1 refills | Status: DC

## 2016-08-30 ENCOUNTER — Ambulatory Visit: Payer: Medicare Other | Admitting: Surgery

## 2016-09-17 ENCOUNTER — Ambulatory Visit (INDEPENDENT_AMBULATORY_CARE_PROVIDER_SITE_OTHER): Payer: Medicare Other | Admitting: Pharmacist Clinician (PhC)/ Clinical Pharmacy Specialist

## 2016-09-17 DIAGNOSIS — I482 Chronic atrial fibrillation, unspecified: Secondary | ICD-10-CM

## 2016-09-17 DIAGNOSIS — Z7901 Long term (current) use of anticoagulants: Secondary | ICD-10-CM | POA: Diagnosis not present

## 2016-09-17 LAB — POCT INR: INR: 2.1

## 2016-09-24 ENCOUNTER — Encounter: Payer: Self-pay | Admitting: Surgery

## 2016-09-29 ENCOUNTER — Encounter: Payer: Self-pay | Admitting: Surgery

## 2016-09-29 ENCOUNTER — Ambulatory Visit (INDEPENDENT_AMBULATORY_CARE_PROVIDER_SITE_OTHER): Payer: Medicare Other | Admitting: Surgery

## 2016-09-29 VITALS — BP 147/68 | HR 62 | Temp 97.0°F | Resp 18 | Ht 65.5 in | Wt 152.0 lb

## 2016-09-29 DIAGNOSIS — I714 Abdominal aortic aneurysm, without rupture, unspecified: Secondary | ICD-10-CM

## 2016-09-29 NOTE — Progress Notes (Signed)
[facility] [place]  Patient name: Gary Valencia MRN: [id number] DOB: Jul 06, 1923 Sex: male   REASON FOR VISIT:    Follow up AAA  HISOTRY OF PRESENT ILLNESS:   Gary Valencia is a 83 y.o. male who returns today for follow-up.  He is status post endovascular aneurysm repair on 12/11/2015.  He has no complaints today.  PAST MEDICAL HISTORY:   Past Medical History:  Diagnosis Date  . AAA (abdominal aortic aneurysm) ([place])   . Actinic keratoses   . Anxiety   . ASHD (arteriosclerotic heart disease) 1984  . Atrial fibrillation ([place])   . Cancer ([place]) 1999   PROSTATE  . Diverticulosis   . Dyslipidemia   . Dysrhythmia   . Personal history of colonic polyps      FAMILY HISTORY:   Family History  Problem Relation Age of Onset  . Arthritis Mother   . Heart disease Father   . AAA (abdominal aortic aneurysm) Brother     SOCIAL HISTORY:   Social History  Substance Use Topics  . Smoking status: Never Smoker  . Smokeless tobacco: Never Used  . Alcohol use No     ALLERGIES:   Allergies  Allergen Reactions  . Asa Buff (Mag [Buffered Aspirin] Nausea Only  . Zetia [Ezetimibe] Other (See Comments)    Can not remember reaction     CURRENT MEDICATIONS:   Current Outpatient Prescriptions  Medication Sig Dispense Refill  . Cod Liver Oil 1250-135 UNITS CAPS Take 2 capsules by mouth daily.     . metoprolol tartrate (LOPRESSOR) 25 MG tablet Take 0.5 tablets (12.5 mg total) by mouth 2 (two) times daily. 90 tablet 2  . Multiple Vitamin (MULITIVITAMIN WITH MINERALS) TABS Take 1 tablet by mouth daily.    Marland Kitchen oxyCODONE-acetaminophen (ROXICET) 5-325 MG tablet Take 1 tablet by mouth every 6 (six) hours as needed for severe pain. (Patient not taking: Reported on 01/12/2016) 15 tablet 0  . rosuvastatin (CRESTOR) 10 MG tablet Take 1 tablet (10 mg total) by mouth daily. 90 tablet 2  . warfarin (COUMADIN) 5 MG tablet TAKE 1  TABLET DAILY OR AS DIRECTED BY COUMADIN CLINIC 90 tablet 1   No current facility-administered medications for this visit.     REVIEW OF SYSTEMS:   [X]  denotes positive finding, [ ]  denotes negative finding Cardiac  Comments:  Chest pain or chest pressure:    Shortness of breath upon exertion:    Short of breath when lying flat:    Irregular heart rhythm:        Vascular    Pain in calf, thigh, or hip brought on by ambulation:    Pain in feet at night that wakes you up from your sleep:     Blood clot in your veins:    Leg swelling:         Pulmonary    Oxygen at home:    Productive cough:     Wheezing:         Neurologic    Sudden weakness in arms or legs:     Sudden numbness in arms or legs:     Sudden onset of difficulty speaking or slurred speech:    Temporary loss of vision in one eye:     Problems with dizziness:         Gastrointestinal    Blood in stool:     Vomited blood:         Genitourinary  Burning when urinating:     Blood in urine:        Psychiatric    Major depression:         Hematologic    Bleeding problems:    Problems with blood clotting too easily:        Skin    Rashes or ulcers:        Constitutional    Fever or chills:      PHYSICAL EXAM:   There were no vitals filed for this visit.  GENERAL: The patient is a well-nourished male, in no acute distress. The vital signs are documented above. CARDIAC: There is a regular rate and rhythm.  PULMONARY: Non-labored respirations ABDOMEN: Soft and non-tender.  No pulsatile mass  MUSCULOSKELETAL: There are no major deformities or cyanosis. NEUROLOGIC: No focal weakness or paresthesias are detected. SKIN: There are no ulcers or rashes noted. PSYCHIATRIC: The patient has a normal affect.  STUDIES:   I have reviewed his CT scan with the following findings: Aneurysm sac surrounding the aortic endograft shows further decrease in size and now measures 4.7 x 4.7 cm (maximal oblique diameter  of 4.8 cm). 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  NON-VASCULAR  Stable cholelithiasis, arterial perfusion abnormality at the dome of the liver and T12 compression fracture.  MEDICAL ISSUES:   AAA:  By CT scan today, the endoleak still persisted however the aneurysm is decreasing in size.  He remains asymptomatic.  I have recommended repeat surveillance imaging with ultrasound and one year.  I did discuss that if it starts to increase in size we may need to repeat the CT scan, however as long as his aneurysm is decreasing, this can be followed with ultrasound.    Annamarie Major, MD [facility] [phone] Pager [phone]

## 2016-10-07 ENCOUNTER — Other Ambulatory Visit: Payer: Self-pay | Admitting: Cardiovascular Disease

## 2016-10-07 NOTE — Telephone Encounter (Signed)
Rx(s) sent to pharmacy electronically.  

## 2016-10-08 NOTE — Addendum Note (Signed)
Addended by: Lianne Cure A on: 10/08/2016 11:58 AM   Modules accepted: Orders

## 2016-10-29 ENCOUNTER — Ambulatory Visit (INDEPENDENT_AMBULATORY_CARE_PROVIDER_SITE_OTHER): Payer: Medicare Other | Admitting: Pharmacist Clinician (PhC)/ Clinical Pharmacy Specialist

## 2016-10-29 DIAGNOSIS — I482 Chronic atrial fibrillation, unspecified: Secondary | ICD-10-CM

## 2016-10-29 DIAGNOSIS — Z7901 Long term (current) use of anticoagulants: Secondary | ICD-10-CM | POA: Diagnosis not present

## 2016-10-29 LAB — POCT INR: INR: 2.9

## 2016-12-08 ENCOUNTER — Ambulatory Visit (INDEPENDENT_AMBULATORY_CARE_PROVIDER_SITE_OTHER): Payer: Medicare Other | Admitting: Pharmacist

## 2016-12-08 DIAGNOSIS — I482 Chronic atrial fibrillation, unspecified: Secondary | ICD-10-CM

## 2016-12-08 DIAGNOSIS — Z7901 Long term (current) use of anticoagulants: Secondary | ICD-10-CM

## 2016-12-08 LAB — POCT INR: INR: 3.3

## 2017-01-20 ENCOUNTER — Ambulatory Visit (INDEPENDENT_AMBULATORY_CARE_PROVIDER_SITE_OTHER): Payer: Self-pay | Admitting: Pharmacist Clinician (PhC)/ Clinical Pharmacy Specialist

## 2017-01-20 DIAGNOSIS — I482 Chronic atrial fibrillation, unspecified: Secondary | ICD-10-CM

## 2017-01-20 DIAGNOSIS — Z7901 Long term (current) use of anticoagulants: Secondary | ICD-10-CM

## 2017-01-20 LAB — POCT INR: INR: 3.5

## 2017-02-11 ENCOUNTER — Ambulatory Visit (INDEPENDENT_AMBULATORY_CARE_PROVIDER_SITE_OTHER): Payer: Medicare Other | Admitting: Pharmacist Clinician (PhC)/ Clinical Pharmacy Specialist

## 2017-02-11 DIAGNOSIS — Z7901 Long term (current) use of anticoagulants: Secondary | ICD-10-CM | POA: Diagnosis not present

## 2017-02-11 DIAGNOSIS — I482 Chronic atrial fibrillation, unspecified: Secondary | ICD-10-CM

## 2017-02-11 LAB — POCT INR: INR: 3

## 2017-03-11 ENCOUNTER — Ambulatory Visit (INDEPENDENT_AMBULATORY_CARE_PROVIDER_SITE_OTHER): Payer: Medicare Other | Admitting: Pharmacist

## 2017-03-11 DIAGNOSIS — Z7901 Long term (current) use of anticoagulants: Secondary | ICD-10-CM

## 2017-03-11 DIAGNOSIS — I482 Chronic atrial fibrillation, unspecified: Secondary | ICD-10-CM

## 2017-03-11 LAB — POCT INR: INR: 3

## 2017-04-08 ENCOUNTER — Ambulatory Visit (INDEPENDENT_AMBULATORY_CARE_PROVIDER_SITE_OTHER): Payer: Medicare Other | Admitting: Pharmacist

## 2017-04-08 DIAGNOSIS — Z7901 Long term (current) use of anticoagulants: Secondary | ICD-10-CM | POA: Diagnosis not present

## 2017-04-08 DIAGNOSIS — I482 Chronic atrial fibrillation, unspecified: Secondary | ICD-10-CM

## 2017-04-08 LAB — POCT INR: INR: 3

## 2017-04-29 ENCOUNTER — Ambulatory Visit (INDEPENDENT_AMBULATORY_CARE_PROVIDER_SITE_OTHER): Payer: Medicare Other | Admitting: Pharmacist Clinician (PhC)/ Clinical Pharmacy Specialist

## 2017-04-29 DIAGNOSIS — I482 Chronic atrial fibrillation, unspecified: Secondary | ICD-10-CM

## 2017-04-29 DIAGNOSIS — Z7901 Long term (current) use of anticoagulants: Secondary | ICD-10-CM

## 2017-04-29 LAB — POCT INR: INR: 2.8

## 2017-05-16 ENCOUNTER — Telehealth: Payer: Self-pay

## 2017-05-16 ENCOUNTER — Ambulatory Visit (INDEPENDENT_AMBULATORY_CARE_PROVIDER_SITE_OTHER): Payer: Medicare Other | Admitting: Pharmacist Clinician (PhC)/ Clinical Pharmacy Specialist

## 2017-05-16 DIAGNOSIS — I482 Chronic atrial fibrillation, unspecified: Secondary | ICD-10-CM

## 2017-05-16 DIAGNOSIS — Z7901 Long term (current) use of anticoagulants: Secondary | ICD-10-CM | POA: Diagnosis not present

## 2017-05-16 DIAGNOSIS — E785 Hyperlipidemia, unspecified: Secondary | ICD-10-CM

## 2017-05-16 DIAGNOSIS — Z79899 Other long term (current) drug therapy: Secondary | ICD-10-CM

## 2017-05-16 LAB — POCT INR: INR: 2.5

## 2017-05-25 ENCOUNTER — Other Ambulatory Visit (INDEPENDENT_AMBULATORY_CARE_PROVIDER_SITE_OTHER): Payer: Medicare Other

## 2017-05-25 DIAGNOSIS — Z23 Encounter for immunization: Secondary | ICD-10-CM

## 2017-06-13 ENCOUNTER — Ambulatory Visit (INDEPENDENT_AMBULATORY_CARE_PROVIDER_SITE_OTHER): Payer: Medicare Other | Admitting: Pharmacist

## 2017-06-13 ENCOUNTER — Other Ambulatory Visit: Payer: Self-pay | Admitting: Cardiovascular Disease

## 2017-06-13 DIAGNOSIS — Z7901 Long term (current) use of anticoagulants: Secondary | ICD-10-CM

## 2017-06-13 DIAGNOSIS — I482 Chronic atrial fibrillation, unspecified: Secondary | ICD-10-CM

## 2017-06-13 LAB — POCT INR: INR: 3.3

## 2017-06-24 ENCOUNTER — Ambulatory Visit (INDEPENDENT_AMBULATORY_CARE_PROVIDER_SITE_OTHER): Payer: Medicare Other | Admitting: Medical

## 2017-06-24 ENCOUNTER — Encounter: Payer: Self-pay | Admitting: Medical

## 2017-06-24 VITALS — BP 134/82 | Temp 98.0°F | Wt 148.8 lb

## 2017-06-24 DIAGNOSIS — B349 Viral infection, unspecified: Secondary | ICD-10-CM | POA: Diagnosis not present

## 2017-06-24 DIAGNOSIS — R05 Cough: Secondary | ICD-10-CM

## 2017-06-24 DIAGNOSIS — R109 Unspecified abdominal pain: Secondary | ICD-10-CM | POA: Diagnosis not present

## 2017-06-24 DIAGNOSIS — R059 Cough, unspecified: Secondary | ICD-10-CM

## 2017-06-24 NOTE — Progress Notes (Signed)
Subjective: Chief Complaint  Patient presents with  . drymonth , stomach upset    drymonth , stomach up set, nausea  off and on all week   Here with wife for illness.  Here for nausea, upset stomach, dry mouth for about a 5 days.  Been taking Coricidin HBP x 3 days.  Stomach upset this morning.  Worse this morning.  No fever, no vomiting.   Has some cough, has had some phlegm.   Had 1 episode of loose stool.   No sick contacts.  No sore throat, no ear pain.   No head congestion, no chest congestion.   No back pain.   No other aggravating or relieving factors. No other complaint.  Been together 70 years, eat healthy, active in the garden.  Past Medical History:  Diagnosis Date  . AAA (abdominal aortic aneurysm) ([place])   . Actinic keratoses   . Anxiety   . ASHD (arteriosclerotic heart disease) 1984  . Atrial fibrillation ([place])   . Cancer ([place]) 1999   PROSTATE  . Diverticulosis   . Dyslipidemia   . Dysrhythmia   . Personal history of colonic polyps    Current Outpatient Prescriptions on File Prior to Visit  Medication Sig Dispense Refill  . Cod Liver Oil 1250-135 UNITS CAPS Take 2 capsules by mouth daily.     . metoprolol tartrate (LOPRESSOR) 25 MG tablet TAKE ONE-HALF (1/2) TABLET TWICE A DAY 90 tablet 3  . Multiple Vitamin (MULITIVITAMIN WITH MINERALS) TABS Take 1 tablet by mouth daily.    . rosuvastatin (CRESTOR) 10 MG tablet Take 1 tablet (10 mg total) by mouth daily. 90 tablet 2  . warfarin (COUMADIN) 5 MG tablet TAKE 1/2 to 1 TABLET DAILY  AS DIRECTED BY COUMADIN CLINIC 90 tablet 1   No current facility-administered medications on file prior to visit.    ROS as in subjective    Objective: BP 134/82   Temp 98 F (36.7 C)   Wt 148 lb 12.8 oz (67.5 kg)   BMI 24.39 kg/m   Wt Readings from Last 3 Encounters:  06/24/17 148 lb 12.8 oz (67.5 kg)  09/29/16 152 lb (68.9 kg)  08/06/16 156 lb 3.2 oz (70.9 kg)   General appearance: alert, no distress, WD/WN, very pleasant, not  ill appearing HEENT: normocephalic, sclerae anicteric, TMs pearly, nares patent, no discharge or erythema, pharynx normal Oral cavity: slightly dry, otherwise, no lesions Neck: supple, no lymphadenopathy, no thyromegaly, no masses Lungs: CTA bilaterally, no wheezes, rhonchi, or rales Abdomen: +mildly increased bs, soft, non tender, non distended, no masses, no hepatomegaly, no splenomegaly Pulses: 1+ symmetric, upper and lower extremities, normal cap refill Back: nontender   Assessment: Encounter Diagnoses  Name Primary?  . Cough Yes  . Viral illness   . Stomach ache     Plan: symptom and exam suggest viral process that is likely on the tail end.   Advised clear fluids, bland foods BRAT diet, rest, hydration.  He can c/t Coricidin HBP a few more days since he has mild cough.   No red flag symptoms today. Glad to see he is otherwise healthy.   Offered labs for other eval today but he declines.   Discussed supportive care measures.  Call or return if worsening or new symptoms.     Gary Valencia was seen today for drymonth , stomach upset.  Diagnoses and all orders for this visit:  Cough  Viral illness  Stomach ache

## 2017-07-04 ENCOUNTER — Ambulatory Visit (INDEPENDENT_AMBULATORY_CARE_PROVIDER_SITE_OTHER): Payer: Medicare Other | Admitting: Pharmacist Clinician (PhC)/ Clinical Pharmacy Specialist

## 2017-07-04 DIAGNOSIS — Z7901 Long term (current) use of anticoagulants: Secondary | ICD-10-CM | POA: Diagnosis not present

## 2017-07-04 DIAGNOSIS — I482 Chronic atrial fibrillation, unspecified: Secondary | ICD-10-CM

## 2017-07-04 LAB — POCT INR: INR: 3.1

## 2017-08-08 NOTE — Progress Notes (Deleted)
Cardiology Office Note    Date:  08/08/2017   ID:  TERRI MALERBA, DOB 1922-10-13, MRN [id number]  PCP:  [name], MD  Cardiologist:   Sanda Klein, MD   No chief complaint on file.   History of Present Illness:  Gary Valencia is a 83 y.o. male with coronary artery disease who is now more than 30 years after bypass surgery, without need for revascularization since. He has chronic atrial fibrillation and tolerates warfarin anticoagulation without bleeding problems and without frequent need for dose adjustment. He has not had any neurological events.  He successfully underwent nonsurgical exclusion of his abdominal aortic aneurysm with Dr. Trula Slade in April 2017. 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 The aneurysm sac has been decreasing in size despite the presence of the endoleak. He last saw Dr. Trula Slade in feb 2018.  He feels well. He is unaware of palpitations. He denies shortness of breath at rest or with activity and continues to work in his yard. He has not had dizzy spells or syncope and denies abdominal pain, angina pectoris, intermittent claudication. He has minimal intermittent ankle edema.  Past Medical History:  Diagnosis Date  . AAA (abdominal aortic aneurysm) ([place])   . Actinic keratoses   . Anxiety   . ASHD (arteriosclerotic heart disease) 1984  . Atrial fibrillation ([place])   . Cancer ([place]) 1999   PROSTATE  . Diverticulosis   . Dyslipidemia   . Dysrhythmia   . Personal history of colonic polyps     Past Surgical History:  Procedure Laterality Date  . ABDOMINAL AORTIC ENDOVASCULAR STENT GRAFT N/A 12/11/2015   Procedure: ABDOMINAL AORTIC ENDOVASCULAR STENT GRAFT;  Surgeon: Serafina Mitchell, MD;  Location: [facility];  Service: Vascular;  Laterality: N/A;  . CORONARY ARTERY BYPASS GRAFT  1984   [facility]- Dr. Redmond Pulling x4  . EYE SURGERY Bilateral    cataracts  . ODONTOID FRACTURE SURGERY      Odontoid screw fixation of type 2 odontoid fracture, open reduction and internal fixation of the fracture--patient had fallen down the stairs at 83 years old  . OTHER SURGICAL HISTORY  01/29/1998   Stage TIC adenocarcinoma of the prostate--Surgeon--David, Grapey M.D.--Interstital seed implantation with I-125 seeds--PSA of approximately 7  . PROSTATE SURGERY  approx 2003   [place] -Grapey  . TONSILLECTOMY      Current Medications: Outpatient Medications Prior to Visit  Medication Sig Dispense Refill  . Cod Liver Oil 1250-135 UNITS CAPS Take 2 capsules by mouth daily.     . metoprolol tartrate (LOPRESSOR) 25 MG tablet TAKE ONE-HALF (1/2) TABLET TWICE A DAY 90 tablet 3  . Multiple Vitamin (MULITIVITAMIN WITH MINERALS) TABS Take 1 tablet by mouth daily.    . rosuvastatin (CRESTOR) 10 MG tablet Take 1 tablet (10 mg total) by mouth daily. 90 tablet 2  . warfarin (COUMADIN) 5 MG tablet TAKE 1/2 to 1 TABLET DAILY  AS DIRECTED BY COUMADIN CLINIC 90 tablet 1   No facility-administered medications prior to visit.      Allergies:   Asa buff (mag [buffered aspirin] and Zetia [ezetimibe]   Social History   Socioeconomic History  . Marital status: Married    Spouse name: Not on file  . Number of children: Not on file  . Years of education: Not on file  . Highest education level: Not on file  Social Needs  . Emergency planning/management officer  strain: Not on file  . Food insecurity - worry: Not on file  . Food insecurity - inability: Not on file  . Transportation needs - medical: Not on file  . Transportation needs - non-medical: Not on file  Occupational History  . Not on file  Tobacco Use  . Smoking status: Never Smoker  . Smokeless tobacco: Never Used  Substance and Sexual Activity  . Alcohol use: No  . Drug use: No  . Sexual activity: Not on file  Other Topics Concern  . Not on file  Social History Narrative  . Not on file     Family History:  The patient's family history includes AAA  (abdominal aortic aneurysm) in his brother; Arthritis in his mother; Heart disease in his father.   ROS:   Please see the history of present illness.    ROS All other systems reviewed and are negative.   PHYSICAL EXAM:   VS:  There were no vitals taken for this visit.    General: Alert, oriented x3, no distress, *** Head: no evidence of trauma, PERRL, EOMI, no exophtalmos or lid lag, no myxedema, no xanthelasma; normal ears, nose and oropharynx Neck: normal jugular venous pulsations and no hepatojugular reflux; brisk carotid pulses without delay and no carotid bruits Chest: clear to auscultation, no signs of consolidation by percussion or palpation, normal fremitus, symmetrical and full respiratory excursions Cardiovascular: normal position and quality of the apical impulse, regular rhythm, normal first and second heart sounds, no murmurs, rubs or gallops Abdomen: no tenderness or distention, no masses by palpation, no abnormal pulsatility or arterial bruits, normal bowel sounds, no hepatosplenomegaly Extremities: no clubbing, cyanosis or edema; 2+ radial, ulnar and brachial pulses bilaterally; 2+ right femoral, posterior tibial and dorsalis pedis pulses; 2+ left femoral, posterior tibial and dorsalis pedis pulses; no subclavian or femoral bruits Neurological: grossly nonfocal Psych: Normal mood and affect   Wt Readings from Last 3 Encounters:  06/24/17 148 lb 12.8 oz (67.5 kg)  09/29/16 152 lb (68.9 kg)  08/06/16 156 lb 3.2 oz (70.9 kg)      Studies/Labs Reviewed:   EKG:  EKG is ordered today.  The ekg ordered today demonstrates Atrial fibrillation with controlled rate, QTC 448 ms  Recent Labs: No results found for requested labs within last 8760 hours.   Lipid Panel    Component Value Date/Time   CHOL 159 08/06/2016 1004   TRIG 71 08/06/2016 1004   HDL 68 08/06/2016 1004   CHOLHDL 2.3 08/06/2016 1004   VLDL 14 08/06/2016 1004   LDLCALC 77 08/06/2016 1004    Additional  studies/ records that were reviewed today include:  Notes from Dr. Trula Slade Feb 2018,  CT images    ASSESSMENT:    1. Atrial fibrillation, permanent   2. Chronic anticoagulation -Warfarin   3. Coronary artery disease involving native coronary artery of native heart without angina pectoris   4. Abdominal aortic aneurysm (AAA) without rupture ([place])   5. Pure hypercholesterolemia      PLAN:  In order of problems listed above:  1. AFib: Asymptomatic, *** rate controlled,  2. Warfarin: no bleeding complications on warfarin anticoagulation with consistently therapeutic INR (last subtherapeutic INR was April 2017 when warfarin was interrupted for AAA repair). 3. CAD s/p CABG 1984: Asymptomatic. Last functional study was a normal nuclear perfusion test in 2012. 4. AAA s/p EVAR: Aneurysm sac is decreasing in size despite the presence of a small type II endoleak. Dr. Trula Slade plans f/u with  [place] and conservative management unless the sac enlarges. 5. HLP: acceptable lipid profile 12 months ago. Time to repeat.    Medication Adjustments/Labs and Tests Ordered: Current medicines are reviewed at length with the patient today.  Concerns regarding medicines are outlined above.  Medication changes, Labs and Tests ordered today are listed in the Patient Instructions below. There are no Patient Instructions on file for this visit.   Signed, Sanda Klein, MD  08/08/2017 1:06 PM    [facility] [address], [place], [place]  [address] Phone: [phone]; Fax: [phone]

## 2017-08-09 ENCOUNTER — Ambulatory Visit (INDEPENDENT_AMBULATORY_CARE_PROVIDER_SITE_OTHER): Payer: Medicare Other | Admitting: Medical

## 2017-08-09 ENCOUNTER — Encounter: Payer: Self-pay | Admitting: Medical

## 2017-08-09 ENCOUNTER — Other Ambulatory Visit: Payer: Self-pay | Admitting: Medical

## 2017-08-09 VITALS — BP 124/82 | HR 67 | Temp 97.4°F | Wt 148.4 lb

## 2017-08-09 DIAGNOSIS — Z7901 Long term (current) use of anticoagulants: Secondary | ICD-10-CM | POA: Diagnosis not present

## 2017-08-09 DIAGNOSIS — B029 Zoster without complications: Secondary | ICD-10-CM | POA: Diagnosis not present

## 2017-08-09 DIAGNOSIS — R197 Diarrhea, unspecified: Secondary | ICD-10-CM

## 2017-08-09 DIAGNOSIS — R109 Unspecified abdominal pain: Secondary | ICD-10-CM

## 2017-08-09 LAB — PROTIME-INR
INR: 2.5 — ABNORMAL HIGH
PROTHROMBIN TIME: 26.6 s — AB (ref 9.0–11.5)

## 2017-08-09 MED ORDER — VALACYCLOVIR HCL 1 G PO TABS: 1000.0000 mg | ORAL_TABLET | Freq: Three times a day (TID) | ORAL | 0 refills | Status: DC

## 2017-08-09 NOTE — Progress Notes (Addendum)
Subjective: Chief Complaint  Patient presents with  . Rash    rash on stomach and going to his back  , no feeling good   Here with wife today for illness.  He reports not feeling well for the past several days, has had some loose stool typically one loose stool a day this week without blood or fever.  He started getting a rash on his left chest wall 2 days ago that has now spread to his back on the left side with blisters.  The rash is mildly painful and irritating.  He still has ongoing abdominal discomfort and loose stool but no worse.  He is taking nothing for his symptoms denies sick contacts.  No other recent illness  He denies a history of shingles but did get the shingles vaccine roughly 4 years ago  No other aggravating or relieving factors. No other complaint.  Past Medical History:  Diagnosis Date  . AAA (abdominal aortic aneurysm) ([place])   . Actinic keratoses   . Anxiety   . ASHD (arteriosclerotic heart disease) 1984  . Atrial fibrillation ([place])   . Cancer ([place]) 1999   PROSTATE  . Diverticulosis   . Dyslipidemia   . Dysrhythmia   . Personal history of colonic polyps    Current Outpatient Medications on File Prior to Visit  Medication Sig Dispense Refill  . Cod Liver Oil 1250-135 UNITS CAPS Take 2 capsules by mouth daily.     . metoprolol tartrate (LOPRESSOR) 25 MG tablet TAKE ONE-HALF (1/2) TABLET TWICE A DAY 90 tablet 3  . Multiple Vitamin (MULITIVITAMIN WITH MINERALS) TABS Take 1 tablet by mouth daily.    . rosuvastatin (CRESTOR) 10 MG tablet Take 1 tablet (10 mg total) by mouth daily. 90 tablet 2  . warfarin (COUMADIN) 5 MG tablet TAKE 1/2 to 1 TABLET DAILY  AS DIRECTED BY COUMADIN CLINIC 90 tablet 1   No current facility-administered medications on file prior to visit.    ROS as in subjective   Objective: BP 124/82   Pulse 67   Temp (!) 97.4 F (36.3 C)   Wt 148 lb 6.4 oz (67.3 kg)   SpO2 98%   BMI 24.32 kg/m   Wt Readings from Last 3 Encounters:   08/09/17 148 lb 6.4 oz (67.3 kg)  06/24/17 148 lb 12.8 oz (67.5 kg)  09/29/16 152 lb (68.9 kg)    General well-developed well-nourished no acute distress He has a large patch of vesicular rash and erythematous base from left mid abdomen to left mid back, which appears to be fully developed shingles rash, in roughly T4 - T5 dermatome [place]: +bs, soft, nontender, no organomegaly, no mass   Assessment: Encounter Diagnoses  Name Primary?  . Herpes zoster without complication Yes  . Diarrhea, unspecified type   . Abdominal discomfort   . Chronic anticoagulation -Warfarin      Plan: Discussed the symptoms, exam findings, suggestive of shingles outbreak.  discussed transmission, precautions to prevent spread to others, particularly high risk groups as discussed including young children, elderly, immunocompromised people, or pregnant women.  Discussed treatment including relative rest, hydration, can use OTC Cortaid topically but don't let others use the cream and discard the cream after this episode of shingles.   Begin Valtrex as below, can use Tylenol OTC.  discussed the possibility of post herpetic neuralgia.   Answered their questions, After visit summary given.   He will cancel his routine cardiology f/u next week due to shingles.  We  will go ahead and draw his PT/INR today   Phelix was seen today for rash.  Diagnoses and all orders for this visit:  Herpes zoster without complication -     Comprehensive metabolic panel -     CBC with Differential/Platelet  Diarrhea, unspecified type -     Comprehensive metabolic panel -     CBC with Differential/Platelet  Abdominal discomfort -     Comprehensive metabolic panel -     CBC with Differential/Platelet  Chronic anticoagulation -Warfarin -     Protime-INR  Other orders -     valACYclovir (VALTREX) 1000 MG tablet; Take 1 tablet (1,000 mg total) by mouth 3 (three) times daily.

## 2017-08-09 NOTE — Patient Instructions (Signed)
Recommendations:  Begin valacyclovir tablet 3 times a day for a week to help suppress the virus  You may use over-the-counter Tylenol for pain  You can use over-the-counter Cortaid or hydrocortisone cream topically if needed for irritation or pain of the rash  Practice good handwashing particularly anytime you touch the rash  Avoid young children or older adults while this rash is present  The rash can take a few weeks to resolve  Some people end up with ongoing pain related to the infection  Lets have you recheck in 10-14 days   Shingles Shingles is an infection that causes a painful skin rash and fluid-filled blisters. Shingles is caused by the same virus that causes chickenpox. Shingles only develops in people who:  Have had chickenpox.  Have gotten the chickenpox vaccine. (This is rare.)  The first symptoms of shingles may be itching, tingling, or pain in an area on your skin. A rash will follow in a few days or weeks. The rash is usually on one side of the body in a bandlike or belt like pattern. Over time, the rash turns into fluid-filled blisters that break open, scab over, and dry up. Medicines may:  Help you manage pain.  Help you recover more quickly.  Help to prevent long-term problems.  Follow these instructions at home: Medicines  Take medicines only as told by your doctor.  Apply an anti-itch or numbing cream to the affected area as told by your doctor. Blister and Rash Care  Take a cool bath or put cool compresses on the area of the rash or blisters as told by your doctor. This may help with pain and itching.  Keep your rash covered with a loose bandage (dressing). Wear loose-fitting clothing.  Keep your rash and blisters clean with mild soap and cool water or as told by your doctor.  Check your rash every day for signs of infection. These include redness, swelling, and pain that lasts or gets worse.  Do not pick your blisters.  Do not scratch your  rash. General instructions  Rest as told by your doctor.  Keep all follow-up visits as told by your doctor. This is important.  Until your blisters scab over, your infection can cause chickenpox in people who have never had it or been vaccinated against it. To prevent this from happening, avoid touching other people or being around other people, especially: ? Babies. ? Pregnant women. ? Children who have eczema. ? Elderly people who have transplants. ? People who have chronic illnesses, such as leukemia or AIDS. Contact a doctor if:  Your pain does not get better with medicine.  Your pain does not get better after the rash heals.  Your rash looks infected. Signs of infection include: ? Redness. ? Swelling. ? Pain that lasts or gets worse. Get help right away if:  The rash is on your face or nose.  You have pain in your face, pain around your eye area, or loss of feeling on one side of your face.  You have ear pain or you have ringing in your ear.  You have loss of taste.  Your condition gets worse. This information is not intended to replace advice given to you by your health care provider. Make sure you discuss any questions you have with your health care provider. Document Released: 01/26/2008 Document Revised: 04/04/2016 Document Reviewed: 05/21/2014 Elsevier Interactive Patient Education  [employer].

## 2017-08-09 NOTE — Addendum Note (Signed)
Addended by: Carlena Hurl on: 08/09/2017 10:30 AM   Modules accepted: Orders, Level of Service

## 2017-08-10 ENCOUNTER — Ambulatory Visit (INDEPENDENT_AMBULATORY_CARE_PROVIDER_SITE_OTHER): Payer: Medicare Other | Admitting: Pharmacist

## 2017-08-10 DIAGNOSIS — I482 Chronic atrial fibrillation, unspecified: Secondary | ICD-10-CM

## 2017-08-10 DIAGNOSIS — Z7901 Long term (current) use of anticoagulants: Secondary | ICD-10-CM

## 2017-08-10 LAB — COMPREHENSIVE METABOLIC PANEL
AG Ratio: 1.2 (calc) (ref 1.0–2.5)
ALT: 13 U/L (ref 9–46)
AST: 25 U/L (ref 10–35)
Albumin: 3.7 g/dL (ref 3.6–5.1)
Alkaline phosphatase (APISO): 113 U/L (ref 40–115)
BUN: 23 mg/dL (ref 7–25)
CO2: 33 mmol/L — ABNORMAL HIGH (ref 20–32)
CREATININE: 1.09 mg/dL (ref 0.70–1.11)
Calcium: 8.8 mg/dL (ref 8.6–10.3)
Chloride: 101 mmol/L (ref 98–110)
GLOBULIN: 3.2 g/dL (ref 1.9–3.7)
GLUCOSE: 89 mg/dL (ref 65–99)
Potassium: 5.1 mmol/L (ref 3.5–5.3)
SODIUM: 137 mmol/L (ref 135–146)
TOTAL PROTEIN: 6.9 g/dL (ref 6.1–8.1)
Total Bilirubin: 0.6 mg/dL (ref 0.2–1.2)

## 2017-08-10 LAB — CBC WITH DIFFERENTIAL/PLATELET
BASOS ABS: 41 {cells}/uL (ref 0–200)
Basophils Relative: 0.9 %
EOS PCT: 8.9 %
Eosinophils Absolute: 401 cells/uL (ref 15–500)
HCT: 35.3 % — ABNORMAL LOW (ref 38.5–50.0)
Hemoglobin: 11.2 g/dL — ABNORMAL LOW (ref 13.2–17.1)
LYMPHS ABS: 504 {cells}/uL — AB (ref 850–3900)
MCH: 29.7 pg (ref 27.0–33.0)
MCHC: 31.7 g/dL — AB (ref 32.0–36.0)
MCV: 93.6 fL (ref 80.0–100.0)
MONOS PCT: 11.4 %
MPV: 10.9 fL (ref 7.5–12.5)
NEUTROS PCT: 67.6 %
Neutro Abs: 3042 cells/uL (ref 1500–7800)
PLATELETS: 158 10*3/uL (ref 140–400)
RBC: 3.77 10*6/uL — AB (ref 4.20–5.80)
RDW: 14.7 % (ref 11.0–15.0)
TOTAL LYMPHOCYTE: 11.2 %
WBC mixed population: 513 cells/uL (ref 200–950)
WBC: 4.5 10*3/uL (ref 3.8–10.8)

## 2017-08-15 ENCOUNTER — Telehealth: Payer: Self-pay

## 2017-08-15 ENCOUNTER — Ambulatory Visit: Payer: Medicare Other | Admitting: Cardiovascular Disease

## 2017-08-15 NOTE — Telephone Encounter (Signed)
Pt wife, Stanton Kidney called to see if pt needs to continue Valtrex or Shingles. They are aware you are out of the office until Wednesday. Gary Valencia

## 2017-08-17 NOTE — Telephone Encounter (Signed)
It was a 7 day course to complete.   I also recommended recheck this week

## 2017-08-18 ENCOUNTER — Other Ambulatory Visit: Payer: Self-pay

## 2017-08-18 MED ORDER — ROSUVASTATIN CALCIUM 10 MG PO TABS: 10.0000 mg | ORAL_TABLET | Freq: Every day | ORAL | 0 refills | Status: DC

## 2017-08-18 NOTE — Telephone Encounter (Signed)
Patient needs labs prior to follow visit with [facility].

## 2017-08-18 NOTE — Telephone Encounter (Signed)
Scheduled for Friday.  

## 2017-08-19 ENCOUNTER — Encounter: Payer: Self-pay | Admitting: Medical

## 2017-08-19 ENCOUNTER — Ambulatory Visit (INDEPENDENT_AMBULATORY_CARE_PROVIDER_SITE_OTHER): Payer: Medicare Other | Admitting: Medical

## 2017-08-19 VITALS — BP 136/80 | HR 69 | Temp 97.5°F | Wt 149.0 lb

## 2017-08-19 DIAGNOSIS — R5381 Other malaise: Secondary | ICD-10-CM

## 2017-08-19 DIAGNOSIS — D649 Anemia, unspecified: Secondary | ICD-10-CM | POA: Diagnosis not present

## 2017-08-19 DIAGNOSIS — B028 Zoster with other complications: Secondary | ICD-10-CM

## 2017-08-19 DIAGNOSIS — M791 Myalgia, unspecified site: Secondary | ICD-10-CM

## 2017-08-19 NOTE — Progress Notes (Signed)
Subjective: Chief Complaint  Patient presents with  . Follow-up    follow up from shingles, getting better, still not feeling good , stomach pain where the shingles are   Here with wife today for f/u on shingles  I saw him 10 days ago for shingles outbreak left chest and back.  He finished valtrex, using topical hydrocortisone and the rash is improving, but he does get a moderate amount of discomfort with the rash.   Using tylenol for pain with some relief.   Overall is improving.   He notes feeling somewhat ill.  No fever, no abdominal upset, no NVD, no arm or leg pain, no recent cough or URI symptoms.      Per last visit's labs with anemia, he defines bleeding, bruising, weight loss.  Prior to shingle outbreak was feeling fine in usual state of health.   He denies a history of shingles but did get the shingles vaccine roughly 4 years ago  No other aggravating or relieving factors. No other complaint.  Past Medical History:  Diagnosis Date  . AAA (abdominal aortic aneurysm) ([place])   . Actinic keratoses   . Anxiety   . ASHD (arteriosclerotic heart disease) 1984  . Atrial fibrillation ([place])   . Cancer ([place]) 1999   PROSTATE  . Diverticulosis   . Dyslipidemia   . Dysrhythmia   . Personal history of colonic polyps    Current Outpatient Medications on File Prior to Visit  Medication Sig Dispense Refill  . Cod Liver Oil 1250-135 UNITS CAPS Take 2 capsules by mouth daily.     . metoprolol tartrate (LOPRESSOR) 25 MG tablet TAKE ONE-HALF (1/2) TABLET TWICE A DAY 90 tablet 3  . Multiple Vitamin (MULITIVITAMIN WITH MINERALS) TABS Take 1 tablet by mouth daily.    . rosuvastatin (CRESTOR) 10 MG tablet Take 1 tablet (10 mg total) by mouth daily. 30 tablet 0  . valACYclovir (VALTREX) 1000 MG tablet Take 1 tablet (1,000 mg total) by mouth 3 (three) times daily. 21 tablet 0  . warfarin (COUMADIN) 5 MG tablet TAKE 1/2 to 1 TABLET DAILY  AS DIRECTED BY COUMADIN CLINIC 90 tablet 1   No current  facility-administered medications on file prior to visit.    ROS as in subjective   Objective: BP 136/80   Pulse 69   Temp (!) 97.5 F (36.4 C)   Wt 149 lb (67.6 kg)   SpO2 95%   BMI 24.42 kg/m   Wt Readings from Last 3 Encounters:  08/19/17 149 lb (67.6 kg)  08/09/17 148 lb 6.4 oz (67.3 kg)  06/24/17 148 lb 12.8 oz (67.5 kg)    General well-developed well-nourished no acute distress He has a large patch of rash and erythematous base from left mid abdomen to left mid back, which appears to be mildly improved with more crusted lesions now compared to 10 days ago, in roughly T4 - T5 dermatome Abdomen: +bs, soft, nontender, no organomegaly, no mass Back otherwise nontender    Assessment: Encounter Diagnoses  Name Primary?  . Herpes zoster with complication Yes  . Myalgia   . Malaise   . Anemia, unspecified type      Plan: We discussed his symptoms, exam findings regarding shingles.  He has finished valtrex.  He can still used hydrocortone cream for the next week then stop this.  Can use lotion OTC at that point.  Discussed the possibility of post herpetic neuralgia and his acute pain from the rash in the coming  weeks.   discussed possible treatment options.  Discussed case with Dr. Redmond School supervising physician as well.   He really doesn't want a strong pain medication and related side effects.  He is getting benefit with over-the-counter Tylenol 2-3 times daily.  I advised that that is okay for the time being.  We discussed tramadol if needed but he declines at this time.  We discussed that in a couple weeks if the pain continues may need to consider Lyrica or other neuropathic medication.  discussed transmission, precautions to prevent spread to others, particularly high risk groups as discussed including young children, elderly, immunocompromised people, or pregnant women.  I asked him to call us back in a few weeks and give [place] an update  Anemia noted on labs from last visit  - we discussed the findings, discussed case with supervising physician Dr. Redmond School as well.  At this time we will just continue to monitor since it is stable and he has no obvious blood loss.   Ebenezer was seen today for follow-up.  Diagnoses and all orders for this visit:  Herpes zoster with complication  Myalgia  Malaise  Anemia, unspecified type

## 2017-08-22 DIAGNOSIS — E785 Hyperlipidemia, unspecified: Secondary | ICD-10-CM | POA: Insufficient documentation

## 2017-08-22 DIAGNOSIS — L570 Actinic keratosis: Secondary | ICD-10-CM | POA: Insufficient documentation

## 2017-08-22 DIAGNOSIS — Z860100 Personal history of colon polyps, unspecified: Secondary | ICD-10-CM | POA: Insufficient documentation

## 2017-08-22 DIAGNOSIS — I4891 Unspecified atrial fibrillation: Secondary | ICD-10-CM | POA: Insufficient documentation

## 2017-08-22 DIAGNOSIS — I499 Cardiac arrhythmia, unspecified: Secondary | ICD-10-CM | POA: Insufficient documentation

## 2017-08-22 DIAGNOSIS — K5790 Diverticulosis of intestine, part unspecified, without perforation or abscess without bleeding: Secondary | ICD-10-CM | POA: Insufficient documentation

## 2017-08-22 DIAGNOSIS — F419 Anxiety disorder, unspecified: Secondary | ICD-10-CM | POA: Insufficient documentation

## 2017-08-30 ENCOUNTER — Ambulatory Visit (INDEPENDENT_AMBULATORY_CARE_PROVIDER_SITE_OTHER): Payer: Medicare Other | Admitting: Pharmacist Clinician (PhC)/ Clinical Pharmacy Specialist

## 2017-08-30 DIAGNOSIS — I482 Chronic atrial fibrillation, unspecified: Secondary | ICD-10-CM

## 2017-08-30 DIAGNOSIS — Z7901 Long term (current) use of anticoagulants: Secondary | ICD-10-CM

## 2017-08-30 LAB — POCT INR: INR: 2.8

## 2017-08-30 NOTE — Patient Instructions (Signed)
Description   Continue with 1 tablet each Monday and Friday, 1/2 tablet all other days, repeat INR in 4 weeks.

## 2017-09-01 ENCOUNTER — Other Ambulatory Visit: Payer: Self-pay | Admitting: Cardiovascular Disease

## 2017-09-02 ENCOUNTER — Ambulatory Visit
Admission: RE | Admit: 2017-09-02 | Discharge: 2017-09-02 | Disposition: A | Discharge status: Home / Self Care | Payer: Medicare Other | Source: Ambulatory Visit | Attending: Family Medicine | Admitting: Family Medicine

## 2017-09-02 ENCOUNTER — Ambulatory Visit (INDEPENDENT_AMBULATORY_CARE_PROVIDER_SITE_OTHER): Payer: Medicare Other | Admitting: Family Medicine

## 2017-09-02 ENCOUNTER — Telehealth: Payer: Self-pay | Admitting: Family Medicine

## 2017-09-02 ENCOUNTER — Encounter: Payer: Self-pay | Admitting: Family Medicine

## 2017-09-02 VITALS — BP 122/72 | HR 64 | Temp 97.7°F | Resp 16 | Ht 67.5 in | Wt 148.4 lb

## 2017-09-02 DIAGNOSIS — R058 Other specified cough: Secondary | ICD-10-CM

## 2017-09-02 DIAGNOSIS — J209 Acute bronchitis, unspecified: Secondary | ICD-10-CM

## 2017-09-02 DIAGNOSIS — R05 Cough: Secondary | ICD-10-CM

## 2017-09-02 DIAGNOSIS — R0989 Other specified symptoms and signs involving the circulatory and respiratory systems: Secondary | ICD-10-CM | POA: Diagnosis not present

## 2017-09-02 MED ORDER — DOXYCYCLINE HYCLATE 100 MG PO TABS: 100.0000 mg | ORAL_TABLET | Freq: Two times a day (BID) | ORAL | 0 refills | Status: DC

## 2017-09-02 NOTE — Progress Notes (Signed)
Chief Complaint  Patient presents with  . Acute Visit    brown mucus production when coughing, taking OTC medication no relief, nose congestion    Subjective:  Gary Valencia is a 84 y.o. male who presents for a one week history of cough that is intermittently productive of brown sputum.  No smoking history.   Denies fever, chills, ear pain, headache, rhinorrhea, nasal congestion, sore throat, chest pain, palpitations, shortness of breath, wheezing, abdominal pain, N/V/D, LE edema.   Treatment to date: Coricidin.  His wife is also sick.  No other aggravating or relieving factors.  No other c/o.  ROS as in subjective.  Past Medical History:  Diagnosis Date  . AAA (abdominal aortic aneurysm) ([place])   . Actinic keratoses   . Anxiety   . ASHD (arteriosclerotic heart disease) 1984  . Atrial fibrillation ([place])   . Cancer ([place]) 1999   PROSTATE  . Diverticulosis   . Dyslipidemia   . Dysrhythmia   . Personal history of colonic polyps       Objective: Vitals:   09/02/17 1012  BP: 122/72  Pulse: 64  Resp: 16  Temp: 97.7 F (36.5 C)  SpO2: 96%    General appearance: Alert, WD/WN, no distress, mildly ill appearing                             Skin: warm, no rash                           Head: no sinus tenderness                            Eyes: conjunctiva normal, corneas clear, PERRLA                            Ears: pearly TMs, external ear canals normal                          Nose: septum midline, turbinates swollen, with erythema and no discharge             Mouth/throat: MMM, tongue normal, mild pharyngeal erythema                           Neck: supple, no adenopathy, no thyromegaly, nontender                          Heart: RRR, normal S1, S2, no murmurs                         Lungs: wheezing and rhonchi RUL, RLL, left lobe clear     Assessment: Acute bronchitis, unspecified organism - Plan: doxycycline (VIBRA-TABS) 100 MG tablet, DG Chest 2 View  Productive  cough - Plan: doxycycline (VIBRA-TABS) 100 MG tablet, DG Chest 2 View  Abnormal lung sounds - Plan: doxycycline (VIBRA-TABS) 100 MG tablet, DG Chest 2 View    Plan: Discussed that his symptoms and exam are suspicious for pneumonia.  Plan to start him on an antibiotic and send him for chest x-ray.  He is not in any acute distress. he is on warfarin and we discussed that the antibiotic can potentially change the way this  medication works and that he will need a repeat PT/INR next week.  He is aware that if his symptoms worsen that he should return.  His wife is with him and also verbalized understanding.

## 2017-09-02 NOTE — Patient Instructions (Signed)
Take the antibiotic as prescribed and make sure you are staying well hydrated.   This medication may change how your blood thinner, warfarin, works so I recommend you return next week to have your PT/INR checked.   We will call you with XR results. If you become short of breath, have a high fever or feel worse over all then you should come in to be seen on Monday.

## 2017-09-02 NOTE — Telephone Encounter (Signed)
Called pt and informed of XR results and instructions and appt made for follow up

## 2017-09-06 ENCOUNTER — Ambulatory Visit: Payer: Medicare Other | Admitting: Cardiovascular Disease

## 2017-09-08 ENCOUNTER — Encounter: Payer: Self-pay | Admitting: Family Medicine

## 2017-09-08 ENCOUNTER — Ambulatory Visit (INDEPENDENT_AMBULATORY_CARE_PROVIDER_SITE_OTHER): Payer: Medicare Other | Admitting: Family Medicine

## 2017-09-08 VITALS — BP 138/82 | HR 66 | Wt 144.8 lb

## 2017-09-08 DIAGNOSIS — J209 Acute bronchitis, unspecified: Secondary | ICD-10-CM | POA: Diagnosis not present

## 2017-09-08 DIAGNOSIS — B029 Zoster without complications: Secondary | ICD-10-CM | POA: Diagnosis not present

## 2017-09-08 DIAGNOSIS — B001 Herpesviral vesicular dermatitis: Secondary | ICD-10-CM

## 2017-09-08 NOTE — Progress Notes (Signed)
   Subjective:    Patient ID: Gary Valencia, male    DOB: 1923-05-05, 84 y.o.   MRN: [id number]  HPI He is here for recheck.  He was recently treated for bronchitis and is on the tail end of taking his medication.  He states that he is doing much better with that.  He also has a history of shingles and states that that is getting better on a daily basis.  He did have an outbreak of herpes labialis about a week ago.  He does rarely get this problem.   Review of Systems     Objective:   Physical Exam Alert and in no distress.  Exam of the left chest does show healing lesions with no evidence of infection.  Lungs show scattered rhonchi..  Healing vesicular lesion noted on the left lateral lip area.       Assessment & Plan:  Acute bronchitis, unspecified organism  Herpes zoster without complication  Herpes labialis Encouraged him to take the rest of the antibiotic.  The shingles seems to be healing well.  Recommend that he call me the next time he has a herpes labialis outbreak and I will call in some more Valtrex for him.

## 2017-09-26 ENCOUNTER — Other Ambulatory Visit: Payer: Self-pay | Admitting: Cardiovascular Disease

## 2017-09-26 NOTE — Telephone Encounter (Signed)
Rx request sent to pharmacy.  

## 2017-09-27 ENCOUNTER — Ambulatory Visit (INDEPENDENT_AMBULATORY_CARE_PROVIDER_SITE_OTHER): Payer: Medicare Other | Admitting: Pharmacist

## 2017-09-27 DIAGNOSIS — Z7901 Long term (current) use of anticoagulants: Secondary | ICD-10-CM | POA: Diagnosis not present

## 2017-09-27 DIAGNOSIS — I482 Chronic atrial fibrillation, unspecified: Secondary | ICD-10-CM

## 2017-09-27 LAB — POCT INR: INR: 3.2

## 2017-09-27 MED ORDER — ROSUVASTATIN CALCIUM 10 MG PO TABS: 10.0000 mg | ORAL_TABLET | Freq: Every day | ORAL | 0 refills | Status: DC

## 2017-10-03 ENCOUNTER — Other Ambulatory Visit: Payer: Self-pay | Admitting: Cardiovascular Disease

## 2017-10-03 ENCOUNTER — Ambulatory Visit (HOSPITAL_COMMUNITY)
Admission: RE | Admit: 2017-10-03 | Discharge: 2017-10-03 | Disposition: A | Discharge status: Home / Self Care | Payer: Medicare Other | Source: Ambulatory Visit | Attending: Family | Admitting: Family

## 2017-10-03 ENCOUNTER — Encounter: Payer: Self-pay | Admitting: Family

## 2017-10-03 ENCOUNTER — Ambulatory Visit (INDEPENDENT_AMBULATORY_CARE_PROVIDER_SITE_OTHER): Payer: Medicare Other | Admitting: Family

## 2017-10-03 VITALS — BP 170/87 | HR 66 | Temp 96.7°F | Resp 18 | Ht 67.0 in | Wt 146.4 lb

## 2017-10-03 DIAGNOSIS — I714 Abdominal aortic aneurysm, without rupture, unspecified: Secondary | ICD-10-CM

## 2017-10-03 DIAGNOSIS — Z95828 Presence of other vascular implants and grafts: Secondary | ICD-10-CM

## 2017-10-03 NOTE — Telephone Encounter (Signed)
REFILL 

## 2017-10-03 NOTE — Progress Notes (Signed)
[facility] [place]  CC: Follow up s/p Endovascular Repair of Abdominal Aortic Aneurysm    History of Present Illness  Gary Valencia is a 84 y.o. (1923/07/15) male who is status post endovascular aneurysm repair on 12/11/2015 by Dr. Trula Slade.    Pt denies back pain or abdominal pain. He denies claudication sx's with walking. He denies any hx of stroke or TIA.   Dr. Trula Slade last evaluated pt on 09-29-16. At that time he reviewed pt's CT scan from 07-19-16 with the following findings: Aneurysm sac surrounding the aortic endograft shows further decrease in size and now measures 4.7 x 4.7 cm (maximal oblique diameter of 4.8 cm). 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 Stable cholelithiasis, arterial perfusion abnormality at the dome of the liver and T12 compression fracture.  AAA:  By CT scan on 07-19-16, the endoleak still persisted however the aneurysm was decreasing in size.  Pt remained asymptomatic. Dr. Trula Slade recommended repeat surveillance imaging with ultrasound and one year; if it starts to increase in size we may need to repeat the CT scan, however as long as his aneurysm is decreasing, this can be followed with ultrasound.  He takes a statin, beta blocker, and warfarin; pt has atrial fib.    Diabetic: No Tobaccos use: non-smoker   Past Medical History:  Diagnosis Date  . AAA (abdominal aortic aneurysm) ([place])   . Actinic keratoses   . Anxiety   . ASHD (arteriosclerotic heart disease) 1984  . Atrial fibrillation ([place])   . Cancer ([place]) 1999   PROSTATE  . Diverticulosis   . Dyslipidemia   . Dysrhythmia   . Personal history of colonic polyps    Past Surgical History:  Procedure Laterality Date  . ABDOMINAL AORTIC ENDOVASCULAR STENT GRAFT  N/A 12/11/2015   Procedure: ABDOMINAL AORTIC ENDOVASCULAR STENT GRAFT;  Surgeon: Serafina Mitchell, MD;  Location: [place];  Service: Vascular;  Laterality: N/A;  . CORONARY ARTERY BYPASS GRAFT  1984   [facility]- Dr. Redmond Pulling x4  . EYE SURGERY Bilateral    cataracts  . ODONTOID FRACTURE SURGERY     Odontoid screw fixation of type 2 odontoid fracture, open reduction and internal fixation of the fracture--patient had fallen down the stairs at 84 years old  . OTHER SURGICAL HISTORY  01/29/1998   Stage TIC adenocarcinoma of the prostate--Surgeon--David, Grapey M.D.--Interstital seed implantation with I-125 seeds--PSA of approximately 7  . PROSTATE SURGERY  approx 2003   [place] -Grapey  . TONSILLECTOMY     Social History Social History   Tobacco Use  . Smoking status: Never Smoker  . Smokeless tobacco: Never Used  Substance Use Topics  . Alcohol use: No  . Drug use: No   Family History Family History  Problem Relation Age of Onset  . Arthritis Mother   . Heart disease Father   . AAA (abdominal aortic aneurysm) Brother    Current Outpatient Medications on File Prior to Visit  Medication Sig Dispense Refill  . Cod Liver Oil 1250-135 UNITS CAPS Take 2 capsules by mouth daily.     Marland Kitchen doxycycline (VIBRA-TABS) 100 MG tablet Take 1 tablet (100 mg total) by mouth 2 (two) times daily. 20 tablet 0  . metoprolol tartrate (LOPRESSOR) 25 MG tablet TAKE ONE-HALF (1/2) TABLET TWICE A  DAY 90 tablet 3  . Multiple Vitamin (MULITIVITAMIN WITH MINERALS) TABS Take 1 tablet by mouth daily.    . rosuvastatin (CRESTOR) 10 MG tablet Take 1 tablet (10 mg total) by mouth daily. Please schedule appointment for refills. 90 tablet 0  . valACYclovir (VALTREX) 1000 MG tablet Take 1 tablet (1,000 mg total) by mouth 3 (three) times daily. 21 tablet 0  . warfarin (COUMADIN) 5 MG tablet TAKE 1/2 to 1 TABLET DAILY  AS DIRECTED BY COUMADIN CLINIC 90 tablet 1   No current facility-administered medications on file prior to visit.     Allergies  Allergen Reactions  . Asa Buff (Mag [Buffered Aspirin] Nausea Only  . Zetia [Ezetimibe] Other (See Comments)    Can not remember reaction     ROS: See HPI for pertinent positives and negatives.  Physical Examination  Vitals:   10/03/17 1005 10/03/17 1007  BP: (!) 178/87 (!) 170/87  Pulse: 66   Resp: 18   Temp: (!) 96.7 F (35.9 C)   TempSrc: Oral   SpO2: 98%   Weight: 146 lb 6.4 oz (66.4 kg)   Height: 5\' 7"  (1.702 m)    Body mass index is 22.93 kg/m.  General: A&O x 3, WD elderly male HEENT: No gross abnormalities  Pulmonary: Sym exp, respirations are non labored, good air movt, CTAB, no rales, rhonchi, or wheezing. Cardiac: Regular rhythm and rate, no murmur appreciated  Vascular: Vessel Right Left  Radial 2+Palpable 2+Palpable  Carotid  without bruit  without bruit  Aorta mildly palpable N/A  Femoral 2+Palpable 2+Palpable  Popliteal 2+ palpable 1+ palpable  PT 2+Palpable 2+Palpable  DP Not Palpable 1+Palpable   Gastrointestinal: soft, NTND, -G/R, - HSM, - palpable masses, - CVAT B. Musculoskeletal: M/S 5/5 throughout, extremities without ischemic changes. Moderate thoracic kyphosis.  Skin: No rashes, no ulcers, no cellulitis. Moderate bruising at dorsal aspects both hands.  Neurologic: Pain and light touch intact in extremities, Motor exam as listed above. CN 2-12 intact except is hard of hearing, hearing aids in place.  Psychiatric: Normal thought content, mood appropriate for clinical situation.     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   Medical Decision Making  Gary Valencia is a: 84 y.o. male who presents s/p EVAR (Date: 12/11/2015).  Pt is asymptomatic with stable sac size, maximal diameter of 4.9 cm today at mid aorta, transverse, was 4.8 cm (oblique), 4.7 cm AP and transverse.  I discussed with Dr. Trula Slade the CT results in November 2017 vs today's duplex results.  I discussed with the patient the importance of surveillance of the endograft.  The next endograft duplex will be scheduled for 6 months.  The patient will follow up with [place] in 6 months with these studies.  I emphasized the importance of maximal medical management including strict control of blood pressure, blood glucose, and lipid levels, antiplatelet agents, obtaining regular exercise, and cessation of smoking.   Thank you for allowing [place] to participate in this patient's care.  Vinnie Level Tanveer Dobberstein, RN, MSN, FNP-C [facility] [place] Office: [phone]  Clinic Physician: Trula Slade  10/03/2017, 10:32 AM

## 2017-10-04 NOTE — Addendum Note (Signed)
Addended by: Lianne Cure A on: 10/04/2017 10:59 AM   Modules accepted: Orders

## 2017-10-25 ENCOUNTER — Ambulatory Visit (INDEPENDENT_AMBULATORY_CARE_PROVIDER_SITE_OTHER): Payer: Medicare Other | Admitting: Pharmacist

## 2017-10-25 DIAGNOSIS — Z7901 Long term (current) use of anticoagulants: Secondary | ICD-10-CM | POA: Diagnosis not present

## 2017-10-25 DIAGNOSIS — I482 Chronic atrial fibrillation, unspecified: Secondary | ICD-10-CM

## 2017-10-25 LAB — POCT INR: INR: 2.3

## 2017-10-25 MED ORDER — ROSUVASTATIN CALCIUM 10 MG PO TABS: 10.0000 mg | ORAL_TABLET | Freq: Every day | ORAL | 0 refills | Status: DC

## 2017-11-02 ENCOUNTER — Other Ambulatory Visit: Payer: Self-pay | Admitting: Cardiovascular Disease

## 2017-12-05 ENCOUNTER — Ambulatory Visit (INDEPENDENT_AMBULATORY_CARE_PROVIDER_SITE_OTHER): Payer: Medicare Other | Admitting: Cardiovascular Disease

## 2017-12-05 ENCOUNTER — Encounter: Payer: Self-pay | Admitting: Cardiovascular Disease

## 2017-12-05 ENCOUNTER — Ambulatory Visit (INDEPENDENT_AMBULATORY_CARE_PROVIDER_SITE_OTHER): Payer: Medicare Other | Admitting: Pharmacist Clinician (PhC)/ Clinical Pharmacy Specialist

## 2017-12-05 VITALS — BP 122/70 | HR 62 | Ht 67.0 in | Wt 149.0 lb

## 2017-12-05 DIAGNOSIS — I2510 Atherosclerotic heart disease of native coronary artery without angina pectoris: Secondary | ICD-10-CM

## 2017-12-05 DIAGNOSIS — I482 Chronic atrial fibrillation, unspecified: Secondary | ICD-10-CM

## 2017-12-05 DIAGNOSIS — E785 Hyperlipidemia, unspecified: Secondary | ICD-10-CM

## 2017-12-05 DIAGNOSIS — I714 Abdominal aortic aneurysm, without rupture, unspecified: Secondary | ICD-10-CM

## 2017-12-05 DIAGNOSIS — Z7901 Long term (current) use of anticoagulants: Secondary | ICD-10-CM | POA: Diagnosis not present

## 2017-12-05 LAB — LIPID PANEL
CHOL/HDL RATIO: 2.5 ratio (ref 0.0–5.0)
Cholesterol, Total: 165 mg/dL (ref 100–199)
HDL: 67 mg/dL (ref >39)
LDL Calculated: 82 mg/dL (ref 0–99)
TRIGLYCERIDES: 80 mg/dL (ref 0–149)
VLDL Cholesterol Cal: 16 mg/dL (ref 5–40)

## 2017-12-05 LAB — POCT INR: INR: 2.4

## 2017-12-05 MED ORDER — ROSUVASTATIN CALCIUM 10 MG PO TABS: 10.0000 mg | ORAL_TABLET | Freq: Every day | ORAL | 3 refills | Status: DC

## 2017-12-05 NOTE — Patient Instructions (Signed)
Dr [name] recommends that you continue on your current medications as directed. Please refer to the Current Medication list given to you today.  Your physician recommends that you return for lab work at your earliest convenience - FASTING.  Dr [name] recommends that you schedule a follow-up appointment in 12 months. You will receive a reminder letter in the mail two months in advance. If you don't receive a letter, please call our office to schedule the follow-up appointment.  If you need a refill on your cardiac medications before your next appointment, please call your pharmacy. 

## 2017-12-05 NOTE — Patient Instructions (Signed)
Description   Continue with 1 tablet each Monday and Friday, 1/2 tablet all other days, repeat INR in 4 weeks.

## 2017-12-05 NOTE — Progress Notes (Signed)
Cardiology Office Note    Date:  12/07/2017   ID:  Gary Valencia, DOB 1923/02/03, MRN [id number]  PCP:  [name], MD  Cardiologist:   Sanda Klein, MD   Chief Complaint  Patient presents with  . Follow-up    1 year.    History of Present Illness:  Gary Valencia is a 84 y.o. male with coronary artery disease who is now more than 30 years after bypass surgery, without need for revascularization since. He has chronic atrial fibrillation on warfarin anticoagulation and underwent EVAR for AAA in April 2017.  He is remarkably active for his age.  He denies any cardiovascular complaints today.  He successfully underwent nonsurgical exclusion of his abdominal aortic aneurysm with Dr. Trula Slade in April 2017. 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 The aneurysm sac has been decreasing in size despite the presence of the endoleak.  His most recent ultrasound showed a stable aneurysm size, without endoleak.  The patient specifically denies any chest pain at rest exertion, dyspnea at rest or with exertion, orthopnea, paroxysmal nocturnal dyspnea, syncope, palpitations, focal neurological deficits, intermittent claudication, lower extremity edema, unexplained weight gain, cough, hemoptysis or wheezing.   Past Medical History:  Diagnosis Date  . AAA (abdominal aortic aneurysm) ([place])   . Actinic keratoses   . Anxiety   . ASHD (arteriosclerotic heart disease) 1984  . Atrial fibrillation ([place])   . Cancer ([place]) 1999   PROSTATE  . Diverticulosis   . Dyslipidemia   . Dysrhythmia   . Personal history of colonic polyps     Past Surgical History:  Procedure Laterality Date  . ABDOMINAL AORTIC ENDOVASCULAR STENT GRAFT N/A 12/11/2015   Procedure: ABDOMINAL AORTIC ENDOVASCULAR STENT GRAFT;  Surgeon: Serafina Mitchell, MD;  Location: [facility] [place];  Service: Vascular;  Laterality: N/A;  . CORONARY ARTERY BYPASS GRAFT  1984    [facility]- Dr. Redmond Pulling x4  . EYE SURGERY Bilateral    cataracts  . ODONTOID FRACTURE SURGERY     Odontoid screw fixation of type 2 odontoid fracture, open reduction and internal fixation of the fracture--patient had fallen down the stairs at 84 years old  . OTHER SURGICAL HISTORY  01/29/1998   Stage TIC adenocarcinoma of the prostate--Surgeon--David, Grapey M.D.--Interstital seed implantation with I-125 seeds--PSA of approximately 7  . PROSTATE SURGERY  approx 2003   [place] -Grapey  . TONSILLECTOMY      Current Medications: Outpatient Medications Prior to Visit  Medication Sig Dispense Refill  . Cod Liver Oil 1250-135 UNITS CAPS Take 2 capsules by mouth daily.     . metoprolol tartrate (LOPRESSOR) 25 MG tablet TAKE ONE-HALF (1/2) TABLET TWICE A DAY 90 tablet 0  . Multiple Vitamin (MULITIVITAMIN WITH MINERALS) TABS Take 1 tablet by mouth daily.    Marland Kitchen warfarin (COUMADIN) 5 MG tablet TAKE ONE-HALF (1/2) TO ONE TABLET DAILY AS DIRECTED BY COUMADIN CLINIC 90 tablet 1  . rosuvastatin (CRESTOR) 10 MG tablet Take 1 tablet (10 mg total) by mouth daily. (Patient taking differently: Take 10 mg by mouth every other day. ) 90 tablet 0  . doxycycline (VIBRA-TABS) 100 MG tablet Take 1 tablet (100 mg total) by mouth 2 (two) times daily. 20 tablet 0  . valACYclovir (VALTREX) 1000 MG tablet Take 1 tablet (1,000 mg total) by mouth 3 (three) times daily. 21 tablet 0   No facility-administered medications prior to visit.  Allergies:   Asa buff (mag [buffered aspirin] and Zetia [ezetimibe]   Social History   Socioeconomic History  . Marital status: Married    Spouse name: Not on file  . Number of children: Not on file  . Years of education: Not on file  . Highest education level: Not on file  Occupational History  . Not on file  Social Needs  . Financial resource strain: Not on file  . Food insecurity:    Worry: Not on file    Inability: Not on file  . Transportation needs:    Medical: Not  on file    Non-medical: Not on file  Tobacco Use  . Smoking status: Never Smoker  . Smokeless tobacco: Never Used  Substance and Sexual Activity  . Alcohol use: No  . Drug use: No  . Sexual activity: Not on file  Lifestyle  . Physical activity:    Days per week: Not on file    Minutes per session: Not on file  . Stress: Not on file  Relationships  . Social connections:    Talks on phone: Not on file    Gets together: Not on file    Attends religious service: Not on file    Active member of club or organization: Not on file    Attends meetings of clubs or organizations: Not on file    Relationship status: Not on file  Other Topics Concern  . Not on file  Social History Narrative  . Not on file     Family History:  The patient's family history includes AAA (abdominal aortic aneurysm) in his brother; Arthritis in his mother; Heart disease in his father.   ROS:   Please see the history of present illness.    ROS All other systems reviewed and are negative.   PHYSICAL EXAM:   VS:  BP 122/70 (BP Location: Left Arm, Patient Position: Sitting, Cuff Size: Normal)   Pulse 62   Ht 5\' 7"  (1.702 m)   Wt 149 lb (67.6 kg)   BMI 23.34 kg/m     General: Alert, oriented x3, no distress, elderly, but does not appear his stated age Head: no evidence of trauma, PERRL, EOMI, no exophtalmos or lid lag, no myxedema, no xanthelasma; normal ears, nose and oropharynx Neck: normal jugular venous pulsations and no hepatojugular reflux; brisk carotid pulses without delay and no carotid bruits Chest: clear to auscultation, no signs of consolidation by percussion or palpation, normal fremitus, symmetrical and full respiratory excursions Cardiovascular: normal position and quality of the apical impulse, irregular rhythm, normal first and second heart sounds, no murmurs, rubs or gallops Abdomen: no tenderness or distention, no masses by palpation, no abnormal pulsatility or arterial bruits, normal  bowel sounds, no hepatosplenomegaly Extremities: no clubbing, cyanosis or edema; 2+ radial, ulnar and brachial pulses bilaterally; 2+ right femoral, posterior tibial and dorsalis pedis pulses; 2+ left femoral, posterior tibial and dorsalis pedis pulses; no subclavian or femoral bruits Neurological: grossly nonfocal Psych: Normal mood and affect   Wt Readings from Last 3 Encounters:  12/05/17 149 lb (67.6 kg)  10/03/17 146 lb 6.4 oz (66.4 kg)  09/08/17 144 lb 12.8 oz (65.7 kg)      Studies/Labs Reviewed:   EKG:  EKG is ordered today.  The ekg ordered today demonstrates atrial fibrillation, possible periods of junctional rhythm with rate around 60, normal QTC  Recent Labs: 08/09/2017: ALT 13; BUN 23; Creat 1.09; Hemoglobin 11.2; Platelets 158; Potassium 5.1; Sodium 137  Lipid Panel    Component Value Date/Time   CHOL 165 12/05/2017 1031   TRIG 80 12/05/2017 1031   HDL 67 12/05/2017 1031   CHOLHDL 2.5 12/05/2017 1031   CHOLHDL 2.3 08/06/2016 1004   VLDL 14 08/06/2016 1004   LDLCALC 82 12/05/2017 1031    Additional studies/ records that were reviewed today include:  Notes from [facility], [place] images  ASSESSMENT:    1. Atrial fibrillation, permanent   2. Coronary artery disease involving native coronary artery of native heart without angina pectoris   3. AAA (abdominal aortic aneurysm) without rupture ([place])   4. Dyslipidemia      PLAN:  In order of problems listed above:  1. AFib: Asymptomatic, well rate controlled, no bleeding complications on warfarin anticoagulation with consistently therapeutic INR.  Today's ECG suggest that he may have some accelerated junctional rhythm, but the rate is not slow and I do not think we need to adjust his medications.  He is on a tiny dose of beta-blocker.  Asked him to call us if he develops dizziness, weakness or syncope. 2. CAD s/p CABG 1984: Asymptomatic.  His last functional study was in 2012 when he had normal perfusion. 3. AAA s/p EVAR:  Small type II endoleak related to free of mesenteric flow was not seen on the most recent ultrasound in the aneurysm sac is stable in size 4. HLP: Lipid profile performed on today's visit shows acceptable values.  LDL is not quite at target but he has done so well since his revascularization procedure more than 30 years ago that I doubt any changes we make will make a difference.    Medication Adjustments/Labs and Tests Ordered: Current medicines are reviewed at length with the patient today.  Concerns regarding medicines are outlined above.  Medication changes, Labs and Tests ordered today are listed in the Patient Instructions below. Patient Instructions  Dr Sallyanne Kuster recommends that you continue on your current medications as directed. Please refer to the Current Medication list given to you today.  Your physician recommends that you return for lab work at your earliest [place].  Dr Sallyanne Kuster recommends that you schedule a follow-up appointment in 12 months. You will receive a reminder letter in the mail two months in advance. If you don't receive a letter, please call our office to schedule the follow-up appointment.  If you need a refill on your cardiac medications before your next appointment, please call your pharmacy.    Signed, Sanda Klein, MD  12/07/2017 5:52 PM    [address] [place], [place], [place]  [address] Phone: [phone]; Fax: [phone]

## 2017-12-07 ENCOUNTER — Encounter: Payer: Self-pay | Admitting: Cardiovascular Disease

## 2018-01-01 ENCOUNTER — Other Ambulatory Visit: Payer: Self-pay | Admitting: Cardiovascular Disease

## 2018-01-02 ENCOUNTER — Ambulatory Visit (INDEPENDENT_AMBULATORY_CARE_PROVIDER_SITE_OTHER): Payer: Medicare Other | Admitting: Pharmacist

## 2018-01-02 DIAGNOSIS — I482 Chronic atrial fibrillation, unspecified: Secondary | ICD-10-CM

## 2018-01-02 DIAGNOSIS — Z7901 Long term (current) use of anticoagulants: Secondary | ICD-10-CM | POA: Diagnosis not present

## 2018-01-02 LAB — POCT INR: INR: 2.8

## 2018-01-02 MED ORDER — METOPROLOL TARTRATE 25 MG PO TABS: ORAL_TABLET | ORAL | 3 refills | Status: DC

## 2018-01-24 ENCOUNTER — Ambulatory Visit (INDEPENDENT_AMBULATORY_CARE_PROVIDER_SITE_OTHER): Payer: Medicare Other | Admitting: Family Medicine

## 2018-01-24 ENCOUNTER — Encounter: Payer: Self-pay | Admitting: Family Medicine

## 2018-01-24 VITALS — BP 132/78 | HR 65 | Temp 97.4°F | Wt 150.4 lb

## 2018-01-24 DIAGNOSIS — J3489 Other specified disorders of nose and nasal sinuses: Secondary | ICD-10-CM

## 2018-01-24 DIAGNOSIS — C44209 Unspecified malignant neoplasm of skin of left ear and external auricular canal: Secondary | ICD-10-CM

## 2018-01-24 NOTE — Progress Notes (Signed)
   Subjective:    Patient ID: Gary Valencia, male    DOB: [birth year], 84 y.o.   MRN: [id number]  HPI He is here to evaluate a lesion present on the left ear that does not want to heal.  He also has occasional drops of blood on the left nasolabial area.   Review of Systems     Objective:   Physical Exam I 1 x 2 cm ulcerated lesion is noted on the left earlobe.  Exam of the nose does show vascularity but no true lesion at the nasolabial fold.       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

## 2018-01-30 ENCOUNTER — Ambulatory Visit (INDEPENDENT_AMBULATORY_CARE_PROVIDER_SITE_OTHER): Payer: Medicare Other | Admitting: Pharmacist Clinician (PhC)/ Clinical Pharmacy Specialist

## 2018-01-30 DIAGNOSIS — I482 Chronic atrial fibrillation, unspecified: Secondary | ICD-10-CM

## 2018-01-30 DIAGNOSIS — Z7901 Long term (current) use of anticoagulants: Secondary | ICD-10-CM | POA: Diagnosis not present

## 2018-01-30 LAB — POCT INR: INR: 2 (ref 2.0–3.0)

## 2018-02-27 ENCOUNTER — Ambulatory Visit (INDEPENDENT_AMBULATORY_CARE_PROVIDER_SITE_OTHER): Payer: Medicare Other | Admitting: Pharmacist Clinician (PhC)/ Clinical Pharmacy Specialist

## 2018-02-27 DIAGNOSIS — I482 Chronic atrial fibrillation, unspecified: Secondary | ICD-10-CM

## 2018-02-27 DIAGNOSIS — Z7901 Long term (current) use of anticoagulants: Secondary | ICD-10-CM

## 2018-02-27 LAB — POCT INR: INR: 2.3 (ref 2.0–3.0)

## 2018-03-16 ENCOUNTER — Emergency Department (HOSPITAL_BASED_OUTPATIENT_CLINIC_OR_DEPARTMENT_OTHER)
Admission: EM | Admit: 2018-03-16 | Discharge: 2018-03-16 | Disposition: A | Discharge status: Home / Self Care | Payer: Medicare Other | Attending: Emergency Medicine | Admitting: Emergency Medicine

## 2018-03-16 ENCOUNTER — Encounter (HOSPITAL_BASED_OUTPATIENT_CLINIC_OR_DEPARTMENT_OTHER): Payer: Self-pay | Admitting: Emergency Medicine

## 2018-03-16 ENCOUNTER — Other Ambulatory Visit: Payer: Self-pay

## 2018-03-16 DIAGNOSIS — Z9889 Other specified postprocedural states: Secondary | ICD-10-CM | POA: Diagnosis not present

## 2018-03-16 DIAGNOSIS — Z7901 Long term (current) use of anticoagulants: Secondary | ICD-10-CM | POA: Insufficient documentation

## 2018-03-16 DIAGNOSIS — I10 Essential (primary) hypertension: Secondary | ICD-10-CM | POA: Insufficient documentation

## 2018-03-16 DIAGNOSIS — Z8546 Personal history of malignant neoplasm of prostate: Secondary | ICD-10-CM | POA: Diagnosis not present

## 2018-03-16 DIAGNOSIS — H9212 Otorrhea, left ear: Secondary | ICD-10-CM | POA: Diagnosis present

## 2018-03-16 DIAGNOSIS — R58 Hemorrhage, not elsewhere classified: Secondary | ICD-10-CM

## 2018-03-16 DIAGNOSIS — H60322 Hemorrhagic otitis externa, left ear: Secondary | ICD-10-CM | POA: Diagnosis not present

## 2018-03-16 LAB — CBC WITH DIFFERENTIAL/PLATELET
Basophils Absolute: 0 10*3/uL (ref 0.0–0.1)
Basophils Relative: 0 %
EOS PCT: 12 %
Eosinophils Absolute: 0.6 10*3/uL (ref 0.0–0.7)
HEMATOCRIT: 33.1 % — AB (ref 39.0–52.0)
Hemoglobin: 10.4 g/dL — ABNORMAL LOW (ref 13.0–17.0)
LYMPHS ABS: 0.8 10*3/uL (ref 0.7–4.0)
LYMPHS PCT: 16 %
MCH: 30.2 pg (ref 26.0–34.0)
MCHC: 31.4 g/dL (ref 30.0–36.0)
MCV: 96.2 fL (ref 78.0–100.0)
Monocytes Absolute: 0.5 10*3/uL (ref 0.1–1.0)
Monocytes Relative: 10 %
NEUTROS ABS: 3 10*3/uL (ref 1.7–7.7)
NEUTROS PCT: 62 %
Platelets: 168 10*3/uL (ref 150–400)
RBC: 3.44 MIL/uL — AB (ref 4.22–5.81)
RDW: 16.7 % — ABNORMAL HIGH (ref 11.5–15.5)
WBC: 4.9 10*3/uL (ref 4.0–10.5)

## 2018-03-16 LAB — COMPREHENSIVE METABOLIC PANEL
ALK PHOS: 101 U/L (ref 38–126)
ALT: 16 U/L (ref 0–44)
AST: 29 U/L (ref 15–41)
Albumin: 3.2 g/dL — ABNORMAL LOW (ref 3.5–5.0)
Anion gap: 8 (ref 5–15)
BILIRUBIN TOTAL: 0.4 mg/dL (ref 0.3–1.2)
BUN: 27 mg/dL — AB (ref 8–23)
CALCIUM: 8.5 mg/dL — AB (ref 8.9–10.3)
CO2: 28 mmol/L (ref 22–32)
CREATININE: 1.24 mg/dL (ref 0.61–1.24)
Chloride: 103 mmol/L (ref 98–111)
GFR calc non Af Amer: 48 mL/min — ABNORMAL LOW (ref >60)
GFR, EST AFRICAN AMERICAN: 56 mL/min — AB (ref >60)
Glucose, Bld: 109 mg/dL — ABNORMAL HIGH (ref 70–99)
Potassium: 4.7 mmol/L (ref 3.5–5.1)
Sodium: 139 mmol/L (ref 135–145)
Total Protein: 7.5 g/dL (ref 6.5–8.1)

## 2018-03-16 LAB — PROTIME-INR
INR: 2.99
PROTHROMBIN TIME: 30.8 s — AB (ref 11.4–15.2)

## 2018-03-16 NOTE — Discharge Instructions (Signed)
Your exam today was consistent with mild postoperative bleeding.  We were able to use the special gauze to help stop the bleeding.  We checked your lab work and your hemoglobin was similar to prior and your Coumadin level was normal.  Please keep the pressure dressing applied and follow-up with them in the next few days.  If any symptoms change or worsen or you begin having other symptoms, please return to the nearest emergency department.

## 2018-03-16 NOTE — ED Provider Notes (Signed)
[facility] EMERGENCY DEPARTMENT Provider Note   CSN: [id number] Arrival date & time: 03/16/18  1858     History   Chief Complaint Chief Complaint  Patient presents with  . Coagulation Disorder    HPI Gary Valencia is a 84 y.o. male.  The history is provided by the patient, the spouse, medical records and a relative. No language interpreter was used.  Ear Drainage  This is a new problem. The current episode started more than 1 week ago. The problem occurs daily. The problem has not changed since onset.Pertinent negatives include no chest pain, no abdominal pain, no headaches and no shortness of breath. Nothing aggravates the symptoms. Nothing relieves the symptoms. He has tried nothing for the symptoms. The treatment provided no relief.    Past Medical History:  Diagnosis Date  . AAA (abdominal aortic aneurysm) ([place])   . Actinic keratoses   . Anxiety   . ASHD (arteriosclerotic heart disease) 1984  . Atrial fibrillation ([place])   . Cancer ([place]) 1999   PROSTATE  . Diverticulosis   . Dyslipidemia   . Dysrhythmia   . Personal history of colonic polyps     Patient Active Problem List   Diagnosis Date Noted  . Herpes labialis 09/08/2017  . Personal history of colonic polyps   . Dysrhythmia   . Dyslipidemia   . Diverticulosis   . Atrial fibrillation ([place])   . Anxiety   . Actinic keratoses   . AAA (abdominal aortic aneurysm) without rupture ([place]) 12/11/2015  . CAD - S/P CABG in 1984 09/13/2012  . HTN (hypertension) 09/13/2012  . Chronic anticoagulation -Warfarin 09/13/2012  . Palpitations 09/13/2012  . Atrial fibrillation, permanent 10/11/2011  . ASHD (arteriosclerotic heart disease) 10/11/2011  . History of prostate cancer 10/11/2011  . AAA (4.09 cm on last [place] -2013) 10/11/2011  . Hyperlipidemia 10/11/2011  . Cancer ([place]) 08/23/1997    Past Surgical History:  Procedure Laterality Date  . ABDOMINAL AORTIC ENDOVASCULAR STENT GRAFT N/A 12/11/2015   Procedure: ABDOMINAL AORTIC ENDOVASCULAR STENT GRAFT;  Surgeon: Serafina Mitchell, MD;  Location: [facility];  Service: Vascular;  Laterality: N/A;  . CORONARY ARTERY BYPASS GRAFT  1984   [facility]- Dr. Redmond Pulling x4  . EYE SURGERY Bilateral    cataracts  . ODONTOID FRACTURE SURGERY     Odontoid screw fixation of type 2 odontoid fracture, open reduction and internal fixation of the fracture--patient had fallen down the stairs at 84 years old  . OTHER SURGICAL HISTORY  01/29/1998   Stage TIC adenocarcinoma of the prostate--Surgeon--David, Grapey M.D.--Interstital seed implantation with I-125 seeds--PSA of approximately 7  . PROSTATE SURGERY  approx 2003   [place] -Grapey  . TONSILLECTOMY          Home Medications    Prior to Admission medications   Medication Sig Start Date End Date Taking? Authorizing Provider  [facility] Liver Oil 1250-135 UNITS CAPS Take 2 capsules by mouth daily.     [provider]  metoprolol tartrate (LOPRESSOR) 25 MG tablet TAKE ONE-HALF (1/2) TABLET TWICE A DAY 01/02/18   [name], [name], MD  Multiple Vitamin (MULITIVITAMIN WITH MINERALS) TABS Take 1 tablet by mouth daily.    [provider]  rosuvastatin (CRESTOR) 10 MG tablet Take 1 tablet (10 mg total) by mouth daily. 12/05/17   [name], [name], MD  warfarin (COUMADIN) 5 MG tablet TAKE ONE-HALF (1/2) TO ONE TABLET DAILY AS DIRECTED BY COUMADIN CLINIC 11/03/17   [name], Dani Gobble, MD  Family History Family History  Problem Relation Age of Onset  . Arthritis Mother   . Heart disease Father   . AAA (abdominal aortic aneurysm) Brother     Social History Social History   Tobacco Use  . Smoking status: Never Smoker  . Smokeless tobacco: Never Used  Substance Use Topics  . Alcohol use: No  . Drug use: No     Allergies   Asa buff (mag [buffered aspirin] and Zetia [ezetimibe]   Review of Systems Review of Systems  Constitutional: Negative for activity change, chills, diaphoresis, fatigue and fever.    HENT: Negative for congestion, dental problem, ear discharge (bleeding), ear pain and rhinorrhea.   Eyes: Negative for visual disturbance.  Respiratory: Negative for cough, chest tightness, shortness of breath, wheezing and stridor.   Cardiovascular: Negative for chest pain, palpitations and leg swelling.  Gastrointestinal: Negative for abdominal distention, abdominal pain, blood in stool, constipation, diarrhea, nausea and vomiting.  Genitourinary: Negative for difficulty urinating, dysuria, flank pain and frequency.  Musculoskeletal: Negative for back pain, gait problem, neck pain and neck stiffness.  Skin: Positive for wound. Negative for rash.  Neurological: Negative for dizziness, weakness, light-headedness, numbness and headaches.  Psychiatric/Behavioral: Negative for agitation and confusion.  All other systems reviewed and are negative.    Physical Exam Updated Vital Signs BP (!) 127/58 (BP Location: Left Arm)   Pulse 65   Temp 98.2 F (36.8 C) (Oral)   Resp 18   Ht 5\' 7"  (1.702 m)   Wt 64.4 kg (142 lb)   SpO2 100%   BMI 22.24 kg/m   Physical Exam  Constitutional: He appears well-developed and well-nourished.  HENT:  Head: Atraumatic.  Right Ear: External ear normal.  Left Ear: No drainage, swelling or tenderness.  No middle ear effusion.  Ears:  Nose: Nose normal.  Mouth/Throat: Oropharynx is clear and moist.  Eyes: Pupils are equal, round, and reactive to light. Conjunctivae are normal.  Neck: Neck supple.  Cardiovascular: Normal rate and regular rhythm.  No murmur heard. Pulmonary/Chest: Effort normal and breath sounds normal. No respiratory distress. He has no wheezes. He exhibits no tenderness.  Abdominal: Soft. There is no tenderness.  Musculoskeletal: He exhibits no edema or tenderness.  Neurological: He is alert.  Skin: Skin is warm and dry. Capillary refill takes less than 2 seconds. He is not diaphoretic. No erythema. No pallor.  Psychiatric: He has a  normal mood and affect.  Nursing note and vitals reviewed.    ED Treatments / Results  Labs (all labs ordered are listed, but only abnormal results are displayed) Labs Reviewed  CBC WITH DIFFERENTIAL/PLATELET - Abnormal; Notable for the following components:      Result Value   RBC 3.44 (*)    Hemoglobin 10.4 (*)    HCT 33.1 (*)    RDW 16.7 (*)    All other components within normal limits  COMPREHENSIVE METABOLIC PANEL - Abnormal; Notable for the following components:   Glucose, Bld 109 (*)    BUN 27 (*)    Calcium 8.5 (*)    Albumin 3.2 (*)    GFR calc non Af Amer 48 (*)    GFR calc Af Amer 56 (*)    All other components within normal limits  PROTIME-INR - Abnormal; Notable for the following components:   Prothrombin Time 30.8 (*)    All other components within normal limits    EKG None  Radiology No results found.  Procedures Procedures (including critical  care time)  Medications Ordered in ED Medications - No data to display   Initial Impression / Assessment and Plan / ED Course  I have reviewed the triage vital signs and the nursing notes.  Pertinent labs & imaging results that were available during my care of the patient were reviewed by me and considered in my medical decision making (see chart for details).     LAWYER WASHABAUGH is a 84 y.o. male with a past medical history significant for atrial fibrillation on Coumadin, hyperlipidemia, CAD status post CABG, AAA status post stenting, and  recent skin cancer removal on the left ear who presents for bleeding.  Patient reports that 9 days ago he had a skin cancer removed from his left ear.  He reports that he has had postoperative bleeding on and off but today it started bleeding and has not stopped.  He reports no symptoms of anemia with no lightheadedness, palpitations, chest pain, shortness of breath.  He reports that pressure and dressing changes has not helped.  The bleeding is coming from behind his left  ear at the graft donation site.  Patient denies fevers, chills, or other drainage.  He denies other complaints.  Next  On exam, patient has oozing bleeding, or from the back of his left ear.  It is not pulsatile.  Patient's inner ear shows dried blood and healing wound.  No tenderness present.  Lungs clear and chest otherwise nontender.  Abdomen nontender.  Patient appears well.  Initially, pressure was applied and it continued to bleed.  Combat gauze was utilized stopping the bleeding with a pressure dressing.  Patient was observed for several hours with no bleeding through the dressing.  Patient had INR checked and was within his target range.  Patient also had a hemoglobin checked which was similar to prior.  Given patient's lack of anemia, normal INR, and his resolution of bleeding, patient was felt stable for discharge home and follow-up with his ear doctor.  Patient was understanding the plan of care and understood return precautions.  Patient discharged in good condition.   Final Clinical Impressions(s) / ED Diagnoses   Final diagnoses:  Bleeding    ED Discharge Orders    None     Clinical Impression: 1. Bleeding     Disposition: Discharge  Condition: Good  I have discussed the results, Dx and Tx plan with the pt(& family if present). He/she/they expressed understanding and agree(s) with the plan. Discharge instructions discussed at great length. Strict return precautions discussed and pt &/or family have verbalized understanding of the instructions. No further questions at time of discharge.    New Prescriptions   No medications on file    Follow Up: [name], [place] [id number] [phone]     [facility] EMERGENCY DEPARTMENT [address] [id number] [address] [phone]       Chaunice Obie, Gwenyth Allegra, MD 03/17/18 [phone]

## 2018-03-16 NOTE — ED Notes (Signed)
Pt understood dc material. NAD Noted 

## 2018-03-16 NOTE — ED Triage Notes (Signed)
Pt had skin cancer removed from L ear on 7/16. Bleeding started today. Pt takes coumadin.

## 2018-03-18 ENCOUNTER — Emergency Department (HOSPITAL_BASED_OUTPATIENT_CLINIC_OR_DEPARTMENT_OTHER)
Admission: EM | Admit: 2018-03-18 | Discharge: 2018-03-19 | Disposition: A | Discharge status: Home / Self Care | Payer: Medicare Other | Attending: Emergency Medicine | Admitting: Emergency Medicine

## 2018-03-18 ENCOUNTER — Encounter (HOSPITAL_BASED_OUTPATIENT_CLINIC_OR_DEPARTMENT_OTHER): Payer: Self-pay | Admitting: *Deleted

## 2018-03-18 ENCOUNTER — Other Ambulatory Visit: Payer: Self-pay

## 2018-03-18 DIAGNOSIS — I2510 Atherosclerotic heart disease of native coronary artery without angina pectoris: Secondary | ICD-10-CM | POA: Diagnosis not present

## 2018-03-18 DIAGNOSIS — Z7901 Long term (current) use of anticoagulants: Secondary | ICD-10-CM | POA: Insufficient documentation

## 2018-03-18 DIAGNOSIS — Z951 Presence of aortocoronary bypass graft: Secondary | ICD-10-CM | POA: Insufficient documentation

## 2018-03-18 DIAGNOSIS — L7621 Postprocedural hemorrhage and hematoma of skin and subcutaneous tissue following a dermatologic procedure: Secondary | ICD-10-CM | POA: Insufficient documentation

## 2018-03-18 DIAGNOSIS — T148XXA Other injury of unspecified body region, initial encounter: Secondary | ICD-10-CM

## 2018-03-18 DIAGNOSIS — I10 Essential (primary) hypertension: Secondary | ICD-10-CM | POA: Insufficient documentation

## 2018-03-18 DIAGNOSIS — I4891 Unspecified atrial fibrillation: Secondary | ICD-10-CM | POA: Diagnosis not present

## 2018-03-18 DIAGNOSIS — Z85828 Personal history of other malignant neoplasm of skin: Secondary | ICD-10-CM | POA: Insufficient documentation

## 2018-03-18 DIAGNOSIS — Z79899 Other long term (current) drug therapy: Secondary | ICD-10-CM | POA: Diagnosis not present

## 2018-03-18 MED ORDER — LIDOCAINE-EPINEPHRINE (PF) 1 %-1:200000 IJ SOLN
INTRAMUSCULAR | Status: AC
  Administered 2018-03-18: 10 mL
  Filled 2018-03-18: qty 10

## 2018-03-18 MED ORDER — LIDOCAINE-EPINEPHRINE (PF) 1 %-1:200000 IJ SOLN
10.0000 mL | Freq: Once | INTRAMUSCULAR | Status: AC
  Administered 2018-03-18: 10 mL

## 2018-03-18 NOTE — ED Notes (Signed)
EDP into room, prior to RN assessment, see MD notes, pending orders.   Suture cart at [facility].

## 2018-03-18 NOTE — ED Triage Notes (Signed)
Pt here with bleeding wound to left from skin cancer surgery on 7/16. Seen 7/25 for same. Pt is on blood thinners

## 2018-03-19 NOTE — Discharge Instructions (Addendum)
If you note rebleeding, you will be given dressings to redress the wound.  Follow-up with your surgeon as soon as possible.

## 2018-03-19 NOTE — ED Provider Notes (Signed)
[facility] EMERGENCY DEPARTMENT Provider Note   CSN: [id number] Arrival date & time: 03/18/18  2035     History   Chief Complaint Chief Complaint  Patient presents with  . Wound Check    HPI Gary Valencia is a 84 y.o. male.  HPI  This is a 84 year old male with a history of atrial fibrillation on Coumadin who presents with wound bleeding.  Patient was seen and evaluated on Thursday for the same.  Patient had a skin cancer removed from his left ear.  He had a graft site from the posterior aspect of his left ear which began to bleed.  When he was seen on Thursday, combat gauze was applied with a pressure dressing.  He had no rebleeding until tonight.  Wife noted that he was bleeding through the dressing.  He denies any new pain or injury.  During his visit on Thursday he had a therapeutic INR and hemoglobin was not concerning.  He denies any symptoms of anemia including dizziness or shortness of breath.  He denies any significant pain from the site.  His surgeon is reportedly in [place] but will be back on Tuesday and they have a follow-up appointment.  The surgery was on 1/16.  Past Medical History:  Diagnosis Date  . AAA (abdominal aortic aneurysm) ([place])   . Actinic keratoses   . Anxiety   . ASHD (arteriosclerotic heart disease) 1984  . Atrial fibrillation ([place])   . Cancer ([place]) 1999   PROSTATE  . Diverticulosis   . Dyslipidemia   . Dysrhythmia   . Personal history of colonic polyps     Patient Active Problem List   Diagnosis Date Noted  . Herpes labialis 09/08/2017  . Personal history of colonic polyps   . Dysrhythmia   . Dyslipidemia   . Diverticulosis   . Atrial fibrillation ([place])   . Anxiety   . Actinic keratoses   . AAA (abdominal aortic aneurysm) without rupture ([place]) 12/11/2015  . CAD - S/P CABG in 1984 09/13/2012  . HTN (hypertension) 09/13/2012  . Chronic anticoagulation -Warfarin 09/13/2012  . Palpitations 09/13/2012  . Atrial  fibrillation, permanent 10/11/2011  . ASHD (arteriosclerotic heart disease) 10/11/2011  . History of prostate cancer 10/11/2011  . AAA (4.09 cm on last [place] -2013) 10/11/2011  . Hyperlipidemia 10/11/2011  . Cancer ([place]) 08/23/1997    Past Surgical History:  Procedure Laterality Date  . ABDOMINAL AORTIC ENDOVASCULAR STENT GRAFT N/A 12/11/2015   Procedure: ABDOMINAL AORTIC ENDOVASCULAR STENT GRAFT;  Surgeon: Serafina Mitchell, MD;  Location: [facility];  Service: Vascular;  Laterality: N/A;  . CORONARY ARTERY BYPASS GRAFT  1984   [facility]- Dr. Redmond Pulling x4  . EYE SURGERY Bilateral    cataracts  . ODONTOID FRACTURE SURGERY     Odontoid screw fixation of type 2 odontoid fracture, open reduction and internal fixation of the fracture--patient had fallen down the stairs at 84 years old  . OTHER SURGICAL HISTORY  01/29/1998   Stage TIC adenocarcinoma of the prostate--Surgeon--David, Grapey M.D.--Interstital seed implantation with I-125 seeds--PSA of approximately 7  . PROSTATE SURGERY  approx 2003   [place] -Grapey  . TONSILLECTOMY          Home Medications    Prior to Admission medications   Medication Sig Start Date End Date Taking? Authorizing Provider  [facility] Liver Oil 1250-135 UNITS CAPS Take 2 capsules by mouth daily.     [provider]  metoprolol tartrate (LOPRESSOR) 25 MG tablet  TAKE ONE-HALF (1/2) TABLET TWICE A DAY 01/02/18   [name], [name], MD  Multiple Vitamin (MULITIVITAMIN WITH MINERALS) TABS Take 1 tablet by mouth daily.    [provider]  rosuvastatin (CRESTOR) 10 MG tablet Take 1 tablet (10 mg total) by mouth daily. 12/05/17   [name], [name], MD  warfarin (COUMADIN) 5 MG tablet TAKE ONE-HALF (1/2) TO ONE TABLET DAILY AS DIRECTED BY COUMADIN CLINIC 11/03/17   [name], Dani Gobble, MD    Family History Family History  Problem Relation Age of Onset  . Arthritis Mother   . Heart disease Father   . AAA (abdominal aortic aneurysm) Brother     Social History Social  History   Tobacco Use  . Smoking status: Never Smoker  . Smokeless tobacco: Never Used  Substance Use Topics  . Alcohol use: No  . Drug use: No     Allergies   Asa buff (mag [buffered aspirin] and Zetia [ezetimibe]   Review of Systems Review of Systems  Constitutional: Negative for fever.  Skin: Positive for wound.       Bleeding  All other systems reviewed and are negative.    Physical Exam Updated Vital Signs BP (!) 157/79 (BP Location: Left Arm)   Pulse 66   Temp 97.7 F (36.5 C) (Oral)   Resp 19   Ht 5\' 7"  (1.702 m)   Wt 64.4 kg (142 lb)   SpO2 100%   BMI 22.24 kg/m   Physical Exam  Constitutional: He is oriented to person, place, and time. He appears well-developed and well-nourished.  Elderly, nontoxic-appearing  HENT:  Head: Normocephalic.  Left Ear: Tympanic membrane normal. No swelling or tenderness.  Dried blood suture site noted left ear, posterior ear with what appears to be a large clot, intact sutures superiorly but cannot visualize intact sutures down the wound, there is one area of oozing within clot, no significant swelling, slight bruising noted tracking inferiorly  Neck: Neck supple.  Cardiovascular: Normal rate and regular rhythm.  Pulmonary/Chest: Effort normal. No respiratory distress.  Abdominal: Soft. There is no tenderness.  Musculoskeletal: He exhibits no edema.  Lymphadenopathy:    He has no cervical adenopathy.  Neurological: He is alert and oriented to person, place, and time.  Skin: Skin is warm and dry.  Psychiatric: He has a normal mood and affect.  Nursing note and vitals reviewed.    ED Treatments / Results  Labs (all labs ordered are listed, but only abnormal results are displayed) Labs Reviewed - No data to display  EKG None  Radiology No results found.  Procedures Procedures (including critical care time)  Medications Ordered in ED Medications  lidocaine-EPINEPHrine (XYLOCAINE-EPINEPHrine) 1 %-1:200000 (PF)  injection 10 mL (10 mLs Infiltration Given by Other 03/18/18 2330)     Initial Impression / Assessment and Plan / ED Course  I have reviewed the triage vital signs and the nursing notes.  Pertinent labs & imaging results that were available during my care of the patient were reviewed by me and considered in my medical decision making (see chart for details).     Patient presents with continued postoperative bleeding.  Pressure dressing from Thursday had done well until tonight when it re-bled.  He is overall nontoxic-appearing.  Vital signs reassuring.  Denies symptoms of anemia and with recent reassuring lab work, do not feel we need to repeat.  Is difficult to assess but it appears that some of his sutures posteriorly may have broken and he now has a clot  with oozing.  I attempted to place a suture; however, his skin is very friable and I was unable to get a suture to hold.  For this reason, I applied wound seal and a pressure dressing.  After 30 minutes, no evidence of bleeding through dressing.  Recommend close follow-up with surgeon.  He was provided with additional dressing materials if he is to bleed again and was instructed of how to temporize this.  After history, exam, and medical workup I feel the patient has been appropriately medically screened and is safe for discharge home. Pertinent diagnoses were discussed with the patient. Patient was given return precautions.  Final Clinical Impressions(s) / ED Diagnoses   Final diagnoses:  Bleeding from wound    ED Discharge Orders    None       Dede Dobesh, Barbette Hair, MD 03/19/18 [phone]

## 2018-03-27 ENCOUNTER — Ambulatory Visit (INDEPENDENT_AMBULATORY_CARE_PROVIDER_SITE_OTHER): Payer: Medicare Other | Admitting: Pharmacist

## 2018-03-27 DIAGNOSIS — Z7901 Long term (current) use of anticoagulants: Secondary | ICD-10-CM | POA: Diagnosis not present

## 2018-03-27 DIAGNOSIS — I482 Chronic atrial fibrillation, unspecified: Secondary | ICD-10-CM

## 2018-03-27 LAB — POCT INR: INR: 2.9 (ref 2.0–3.0)

## 2018-04-03 ENCOUNTER — Ambulatory Visit (HOSPITAL_COMMUNITY)
Admission: RE | Admit: 2018-04-03 | Discharge: 2018-04-03 | Disposition: A | Discharge status: Home / Self Care | Payer: Medicare Other | Source: Ambulatory Visit | Attending: Family | Admitting: Family

## 2018-04-03 ENCOUNTER — Other Ambulatory Visit: Payer: Self-pay

## 2018-04-03 ENCOUNTER — Encounter: Payer: Self-pay | Admitting: Family

## 2018-04-03 ENCOUNTER — Ambulatory Visit (INDEPENDENT_AMBULATORY_CARE_PROVIDER_SITE_OTHER): Payer: Medicare Other | Admitting: Family

## 2018-04-03 VITALS — BP 173/86 | HR 60 | Temp 97.0°F | Resp 16 | Ht 67.0 in | Wt 146.9 lb

## 2018-04-03 DIAGNOSIS — Z95828 Presence of other vascular implants and grafts: Secondary | ICD-10-CM | POA: Diagnosis not present

## 2018-04-03 DIAGNOSIS — I714 Abdominal aortic aneurysm, without rupture, unspecified: Secondary | ICD-10-CM

## 2018-04-03 NOTE — Progress Notes (Signed)
[facility] [place]  CC: Follow up s/p Endovascular Repair of Abdominal Aortic Aneurysm    History of Present Illness  Gary Valencia is a 84 y.o. (1923-04-18) male who is status post endovascular abdominal aortic aneurysm repair on 12/11/2015 by Dr. Trula Slade.    Pt denies back pain or abdominal pain. He denies claudication sx's with walking. He denies any hx of stroke or TIA.   Dr. Trula Slade last evaluated pt on 09-29-16. At that time he reviewed pt's CT scan from 07-19-16 with the following findings: Aneurysm sac surrounding the aortic endograft shows further decrease in size and now measures 4.7 x 4.7 cm (maximal oblique diameter of 4.8 cm). 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 Stable cholelithiasis, arterial perfusion abnormality at the dome of the liver and T12 compression fracture.  AAA:By CT scan on 07-19-16, the endoleak still persisted however the aneurysm was decreasing in size. Pt remained asymptomatic. Dr. Trula Slade recommended repeat surveillance imaging with ultrasound and one year; if it starts to increase in size we may need to repeat the CT scan, however as long as his aneurysm is decreasing, this can be followed with ultrasound.  He takes a statin, beta blocker, and warfarin; pt has atrial fib.   Pt states his blood pressure at home was about 140/80.   He states he had some skin cancer removed from his left ear by a dermatologist about the end of July 2019, that office changes the dressing every week, has an appointment tomorrow for this.    Diabetic: No Tobaccos use: non-smoker   Past Medical History:  Diagnosis Date  . AAA (abdominal aortic aneurysm) ([place])   . Actinic keratoses   . Anxiety   . ASHD (arteriosclerotic heart  disease) 1984  . Atrial fibrillation ([place])   . Cancer ([place]) 1999   PROSTATE  . Diverticulosis   . Dyslipidemia   . Dysrhythmia   . Personal history of colonic polyps    Past Surgical History:  Procedure Laterality Date  . ABDOMINAL AORTIC ENDOVASCULAR STENT GRAFT N/A 12/11/2015   Procedure: ABDOMINAL AORTIC ENDOVASCULAR STENT GRAFT;  Surgeon: Serafina Mitchell, MD;  Location: [facility];  Service: Vascular;  Laterality: N/A;  . CORONARY ARTERY BYPASS GRAFT  1984   [facility]- Dr. Redmond Pulling x4  . EYE SURGERY Bilateral    cataracts  . ODONTOID FRACTURE SURGERY     Odontoid screw fixation of type 2 odontoid fracture, open reduction and internal fixation of the fracture--patient had fallen down the stairs at 84 years old  . OTHER SURGICAL HISTORY  01/29/1998   Stage TIC adenocarcinoma of the prostate--Surgeon--David, Grapey M.D.--Interstital seed implantation with I-125 seeds--PSA of approximately 7  . PROSTATE SURGERY  approx 2003   [place] -Grapey  . TONSILLECTOMY     Social History Social History   Tobacco Use  . Smoking status: Never Smoker  . Smokeless tobacco: Never Used  Substance Use Topics  . Alcohol use: No  . Drug use: No   Family History Family History  Problem Relation Age of Onset  . Arthritis Mother   . Heart disease Father   . AAA (abdominal aortic aneurysm) Brother    Current Outpatient Medications on File Prior to Visit  Medication Sig Dispense Refill  . Cod Liver Oil 1250-135  UNITS CAPS Take 2 capsules by mouth daily.     . metoprolol tartrate (LOPRESSOR) 25 MG tablet TAKE ONE-HALF (1/2) TABLET TWICE A DAY 90 tablet 3  . Multiple Vitamin (MULITIVITAMIN WITH MINERALS) TABS Take 1 tablet by mouth daily.    . rosuvastatin (CRESTOR) 10 MG tablet Take 1 tablet (10 mg total) by mouth daily. 90 tablet 3  . warfarin (COUMADIN) 5 MG tablet TAKE ONE-HALF (1/2) TO ONE TABLET DAILY AS DIRECTED BY COUMADIN CLINIC 90 tablet 1   No current facility-administered medications on file  prior to visit.    Allergies  Allergen Reactions  . Asa Buff (Mag [Buffered Aspirin] Nausea Only  . Zetia [Ezetimibe] Other (See Comments)    Can not remember reaction     ROS: See HPI for pertinent positives and negatives.  Physical Examination  Vitals:   04/03/18 1023 04/03/18 1026  BP: (!) 176/82 (!) 173/86  Pulse: 60   Resp: 16   Temp: (!) 97 F (36.1 C)   TempSrc: Oral   SpO2: 98%   Weight: 146 lb 14.4 oz (66.6 kg)   Height: 5\' 7"  (1.702 m)    Body mass index is 23.01 kg/m.  General: A&O x 3, WD elderly male HEENT: Bandage on left ear.  Pulmonary: Sym exp, respirations are non labored, good air movt, CTAB, no rales, rhonchi, or wheezing. Cardiac: Regular rhythm and rate, no murmur appreciated  Vascular: Vessel Right Left  Radial 2+Palpable 2+Palpable  Carotid  without bruit  without bruit  Aorta mildly palpable N/A  Femoral 2+Palpable 2+Palpable  Popliteal 2+ palpable 1+ palpable  PT 2+Palpable 2+Palpable  DP Not Palpable 1+Palpable   Gastrointestinal: soft, NTND, -G/R, - HSM, - palpable masses, - CVAT B. Musculoskeletal: M/S 5/5 throughout, extremities without ischemic changes. Moderate thoracic kyphosis.  Skin: No rashes, no ulcers, no cellulitis. Mild bruising at dorsal aspects both hands.  Neurologic: Pain and light touch intact in extremities, Motor exam as listed above. CN 2-12 intact except is hard of hearing, hearing aids in place.  Psychiatric: Normal thought content, mood appropriate for clinical situation.     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   Medical Decision Making  Gary Valencia is a 84 y.o. male who presents s/p EVAR (Date: 12/11/2015).  Pt is asymptomatic with stable sac size at 4.8 cm.  I discussed with the patient the importance of surveillance of the endograft.  The next endograft duplex will be scheduled for 6 months.  The patient will follow up with [place] in 6 months with these studies.  I emphasized the importance of maximal medical management including strict control of blood pressure, blood glucose, and lipid levels, antiplatelet agents, obtaining regular exercise, and cessation of smoking.   Thank you for allowing [place] to participate in this patient's care.  Clemon Chambers, RN, MSN, FNP-C [facility] [place] Office: [phone]  Clinic Physician: Trula Slade  04/03/2018, 10:44 AM

## 2018-04-03 NOTE — Patient Instructions (Signed)
Before your next abdominal ultrasound:  Avoid gas forming foods and beverages the day before the test.   Take two Extra-Strength Gas-X capsules at bedtime the night before the test. Take another two Extra-Strength Gas-X capsules in the middle of the night if you get up to the restroom, if not, first thing in the morning with water.  Do not chew gum.     

## 2018-04-26 ENCOUNTER — Ambulatory Visit (INDEPENDENT_AMBULATORY_CARE_PROVIDER_SITE_OTHER): Payer: Medicare Other | Admitting: Pharmacist Clinician (PhC)/ Clinical Pharmacy Specialist

## 2018-04-26 DIAGNOSIS — I482 Chronic atrial fibrillation, unspecified: Secondary | ICD-10-CM

## 2018-04-26 DIAGNOSIS — Z7901 Long term (current) use of anticoagulants: Secondary | ICD-10-CM

## 2018-04-26 LAB — POCT INR: INR: 4.7 — AB (ref 2.0–3.0)

## 2018-04-26 NOTE — Patient Instructions (Signed)
Description   No warfarin Wednesday Sept 4 or Thursday Sept 5, then continue with 1 tablet each Monday and Friday, 1/2 tablet all other days, repeat INR in 2 weeks.

## 2018-05-10 ENCOUNTER — Ambulatory Visit (INDEPENDENT_AMBULATORY_CARE_PROVIDER_SITE_OTHER): Payer: Medicare Other | Admitting: Pharmacist Clinician (PhC)/ Clinical Pharmacy Specialist

## 2018-05-10 DIAGNOSIS — I482 Chronic atrial fibrillation, unspecified: Secondary | ICD-10-CM

## 2018-05-10 DIAGNOSIS — Z7901 Long term (current) use of anticoagulants: Secondary | ICD-10-CM

## 2018-05-10 LAB — POCT INR: INR: 4.9 — AB (ref 2.0–3.0)

## 2018-05-10 NOTE — Patient Instructions (Signed)
Description   No warfarin Wednesday Sept 18 or Thursday Sept 19, then decrease dose to 1 tablet each  Friday, 1/2 tablet all other days, repeat INR in 2 weeks.

## 2018-05-16 ENCOUNTER — Other Ambulatory Visit: Payer: Self-pay | Admitting: Cardiovascular Disease

## 2018-05-24 ENCOUNTER — Ambulatory Visit (INDEPENDENT_AMBULATORY_CARE_PROVIDER_SITE_OTHER): Payer: Medicare Other | Admitting: Pharmacist

## 2018-05-24 DIAGNOSIS — I482 Chronic atrial fibrillation, unspecified: Secondary | ICD-10-CM

## 2018-05-24 DIAGNOSIS — Z7901 Long term (current) use of anticoagulants: Secondary | ICD-10-CM

## 2018-05-24 LAB — POCT INR: INR: 4.3 — AB (ref 2.0–3.0)

## 2018-06-07 ENCOUNTER — Ambulatory Visit (INDEPENDENT_AMBULATORY_CARE_PROVIDER_SITE_OTHER): Payer: Medicare Other | Admitting: Pharmacist

## 2018-06-07 DIAGNOSIS — Z7901 Long term (current) use of anticoagulants: Secondary | ICD-10-CM | POA: Diagnosis not present

## 2018-06-07 DIAGNOSIS — I482 Chronic atrial fibrillation, unspecified: Secondary | ICD-10-CM

## 2018-06-07 LAB — POCT INR: INR: 2.3 (ref 2.0–3.0)

## 2018-06-19 ENCOUNTER — Encounter: Payer: Self-pay | Admitting: Family Medicine

## 2018-06-19 ENCOUNTER — Ambulatory Visit (INDEPENDENT_AMBULATORY_CARE_PROVIDER_SITE_OTHER): Payer: Medicare Other | Admitting: Family Medicine

## 2018-06-19 VITALS — BP 128/82 | HR 84 | Temp 97.5°F | Wt 151.4 lb

## 2018-06-19 DIAGNOSIS — L03116 Cellulitis of left lower limb: Secondary | ICD-10-CM

## 2018-06-19 MED ORDER — AMOXICILLIN-POT CLAVULANATE 875-125 MG PO TABS: 1.0000 | ORAL_TABLET | Freq: Two times a day (BID) | ORAL | 0 refills | Status: DC

## 2018-06-19 NOTE — Progress Notes (Signed)
   Subjective:    Patient ID: Gary Valencia, male    DOB: [birth year], 84 y.o.   MRN: [id number]  HPI He injured his left leg about 3 weeks ago.  He has been taking care of this at home.  Within the last week the swelling and discomfort has gotten much worse.   Review of Systems     Objective:   Physical Exam Alert and in no distress.  Exam of the left lower extremity does show erythema warmth and tenderness over the lower two thirds with a eschar of approximately 4 cm noted.       Assessment & Plan:  Cellulitis of left lower extremity - Plan: Ambulatory referral to [place], amoxicillin-clavulanate (AUGMENTIN) 875-125 MG tablet I will have home health care will help take care of this.  He is to come back here in several days for a recheck on this.  I explained that this could take a long time to get better.  We will need to watch it closely as it is gotten worse in the last week.

## 2018-06-19 NOTE — Patient Instructions (Signed)
Take the antibiotic as directed.  Keep your foot elevated as much as possible.  Come back here Friday

## 2018-06-21 ENCOUNTER — Telehealth: Payer: Self-pay

## 2018-06-21 NOTE — Telephone Encounter (Signed)
ok 

## 2018-06-21 NOTE — Telephone Encounter (Signed)
Caryl Pina from [facility] is requesting a verbal for advance wound care . [phone]  Please advise [facility]

## 2018-06-22 ENCOUNTER — Other Ambulatory Visit: Payer: Self-pay

## 2018-06-22 NOTE — Telephone Encounter (Signed)
[facility] made aware. [place]

## 2018-06-23 ENCOUNTER — Ambulatory Visit (INDEPENDENT_AMBULATORY_CARE_PROVIDER_SITE_OTHER): Payer: Medicare Other | Admitting: Family Medicine

## 2018-06-23 DIAGNOSIS — L03116 Cellulitis of left lower limb: Secondary | ICD-10-CM | POA: Diagnosis not present

## 2018-06-23 MED ORDER — AMOXICILLIN-POT CLAVULANATE 875-125 MG PO TABS: 1.0000 | ORAL_TABLET | Freq: Two times a day (BID) | ORAL | 0 refills | Status: DC

## 2018-06-23 NOTE — Progress Notes (Signed)
   Subjective:    Patient ID: Gary Valencia, male    DOB: [birth year], 84 y.o.   MRN: [id number]  HPI He is here for a recheck.  He does note an improvement in the discomfort and swelling of his left lower extremity.  He is now getting wound care at home and has been referred to the wound clinic for further evaluation of the central lesion.   Review of Systems     Objective:   Physical Exam Alert and in no distress.  The central lesion of approximately 4 to 5 cm does show some granulation tissue however the surrounding tissue is less edematous.       Assessment & Plan:  Cellulitis of left lower extremity - Plan: amoxicillin-clavulanate (AUGMENTIN) 875-125 MG tablet He will continue with wound care management at home and we will also refer to the wound clinic to see if any surgical intervention is needed due to the granulation tissue.

## 2018-06-26 ENCOUNTER — Telehealth: Payer: Self-pay | Admitting: Family Medicine

## 2018-06-26 NOTE — Telephone Encounter (Signed)
  Gary Valencia with [facility]  [phone] She is wanting information on the wound care center that he is being referred to  Please call

## 2018-06-27 ENCOUNTER — Telehealth: Payer: Self-pay

## 2018-06-27 ENCOUNTER — Other Ambulatory Visit: Payer: Self-pay

## 2018-06-27 DIAGNOSIS — L03116 Cellulitis of left lower limb: Secondary | ICD-10-CM

## 2018-06-27 NOTE — Telephone Encounter (Signed)
Order put in and placed a call to [facility]. [place]

## 2018-06-27 NOTE — Telephone Encounter (Signed)
Pt advised of appt with Dr. Dellia Nims 07-07-18 at 2 pm wound care. [place]

## 2018-06-28 ENCOUNTER — Ambulatory Visit (INDEPENDENT_AMBULATORY_CARE_PROVIDER_SITE_OTHER): Payer: Medicare Other | Admitting: Pharmacist

## 2018-06-28 DIAGNOSIS — I482 Chronic atrial fibrillation, unspecified: Secondary | ICD-10-CM | POA: Diagnosis not present

## 2018-06-28 DIAGNOSIS — Z7901 Long term (current) use of anticoagulants: Secondary | ICD-10-CM

## 2018-06-28 LAB — POCT INR: INR: 2 (ref 2.0–3.0)

## 2018-07-04 ENCOUNTER — Ambulatory Visit (INDEPENDENT_AMBULATORY_CARE_PROVIDER_SITE_OTHER): Payer: Medicare Other | Admitting: Family Medicine

## 2018-07-04 VITALS — BP 114/78 | HR 66 | Temp 97.4°F | Wt 151.0 lb

## 2018-07-04 DIAGNOSIS — L03116 Cellulitis of left lower limb: Secondary | ICD-10-CM | POA: Diagnosis not present

## 2018-07-04 NOTE — Progress Notes (Signed)
   Subjective:    Patient ID: Gary Valencia, male    DOB: 1923/03/24, 84 y.o.   MRN: [id number]  HPI He is here for a recheck.  His lesion on the left shin is causing him very little difficulty.  He is happy with the progress.  He has 6 days of medication left.   Review of Systems     Objective:   Physical Exam Alert and in no distress.  The lesion does seem to be less erythematous and raised but still the same size.  There is surrounding edema it is not hot red or tender.       Assessment & Plan:  Cellulitis of left lower extremity He will finish out the antibiotic.  He does have a wound care appointment scheduled for later in the week and I will defer further intervention to them.

## 2018-07-07 ENCOUNTER — Encounter (HOSPITAL_BASED_OUTPATIENT_CLINIC_OR_DEPARTMENT_OTHER): Payer: Medicare Other | Attending: Internal Medicine

## 2018-07-07 DIAGNOSIS — I129 Hypertensive chronic kidney disease with stage 1 through stage 4 chronic kidney disease, or unspecified chronic kidney disease: Secondary | ICD-10-CM | POA: Insufficient documentation

## 2018-07-07 DIAGNOSIS — L97822 Non-pressure chronic ulcer of other part of left lower leg with fat layer exposed: Secondary | ICD-10-CM | POA: Insufficient documentation

## 2018-07-07 DIAGNOSIS — I890 Lymphedema, not elsewhere classified: Secondary | ICD-10-CM | POA: Diagnosis not present

## 2018-07-07 DIAGNOSIS — N183 Chronic kidney disease, stage 3 (moderate): Secondary | ICD-10-CM | POA: Insufficient documentation

## 2018-07-07 DIAGNOSIS — E1151 Type 2 diabetes mellitus with diabetic peripheral angiopathy without gangrene: Secondary | ICD-10-CM | POA: Diagnosis not present

## 2018-07-07 DIAGNOSIS — I87332 Chronic venous hypertension (idiopathic) with ulcer and inflammation of left lower extremity: Secondary | ICD-10-CM | POA: Diagnosis present

## 2018-07-07 DIAGNOSIS — L97221 Non-pressure chronic ulcer of left calf limited to breakdown of skin: Secondary | ICD-10-CM | POA: Insufficient documentation

## 2018-07-07 DIAGNOSIS — E11622 Type 2 diabetes mellitus with other skin ulcer: Secondary | ICD-10-CM | POA: Diagnosis not present

## 2018-07-12 DIAGNOSIS — I87332 Chronic venous hypertension (idiopathic) with ulcer and inflammation of left lower extremity: Secondary | ICD-10-CM | POA: Diagnosis not present

## 2018-07-19 DIAGNOSIS — I87332 Chronic venous hypertension (idiopathic) with ulcer and inflammation of left lower extremity: Secondary | ICD-10-CM | POA: Diagnosis not present

## 2018-07-27 ENCOUNTER — Ambulatory Visit (INDEPENDENT_AMBULATORY_CARE_PROVIDER_SITE_OTHER): Payer: Medicare Other | Admitting: Pharmacist

## 2018-07-27 DIAGNOSIS — I482 Chronic atrial fibrillation, unspecified: Secondary | ICD-10-CM

## 2018-07-27 DIAGNOSIS — Z7901 Long term (current) use of anticoagulants: Secondary | ICD-10-CM

## 2018-07-27 LAB — POCT INR: INR: 2.3 (ref 2.0–3.0)

## 2018-07-28 ENCOUNTER — Encounter (HOSPITAL_BASED_OUTPATIENT_CLINIC_OR_DEPARTMENT_OTHER): Payer: Medicare Other | Attending: Internal Medicine

## 2018-07-28 DIAGNOSIS — I10 Essential (primary) hypertension: Secondary | ICD-10-CM | POA: Diagnosis not present

## 2018-07-28 DIAGNOSIS — L97822 Non-pressure chronic ulcer of other part of left lower leg with fat layer exposed: Secondary | ICD-10-CM | POA: Insufficient documentation

## 2018-07-28 DIAGNOSIS — I2510 Atherosclerotic heart disease of native coronary artery without angina pectoris: Secondary | ICD-10-CM | POA: Diagnosis not present

## 2018-07-28 DIAGNOSIS — I87312 Chronic venous hypertension (idiopathic) with ulcer of left lower extremity: Secondary | ICD-10-CM | POA: Diagnosis present

## 2018-08-04 DIAGNOSIS — I87312 Chronic venous hypertension (idiopathic) with ulcer of left lower extremity: Secondary | ICD-10-CM | POA: Diagnosis not present

## 2018-08-18 DIAGNOSIS — I87312 Chronic venous hypertension (idiopathic) with ulcer of left lower extremity: Secondary | ICD-10-CM | POA: Diagnosis not present

## 2018-08-25 ENCOUNTER — Encounter (HOSPITAL_BASED_OUTPATIENT_CLINIC_OR_DEPARTMENT_OTHER): Payer: Medicare Other | Attending: Internal Medicine

## 2018-08-25 ENCOUNTER — Other Ambulatory Visit: Payer: Self-pay | Admitting: *Deleted

## 2018-08-25 DIAGNOSIS — I2510 Atherosclerotic heart disease of native coronary artery without angina pectoris: Secondary | ICD-10-CM | POA: Insufficient documentation

## 2018-08-25 DIAGNOSIS — I87312 Chronic venous hypertension (idiopathic) with ulcer of left lower extremity: Secondary | ICD-10-CM | POA: Insufficient documentation

## 2018-08-25 DIAGNOSIS — I872 Venous insufficiency (chronic) (peripheral): Secondary | ICD-10-CM | POA: Insufficient documentation

## 2018-08-25 DIAGNOSIS — I10 Essential (primary) hypertension: Secondary | ICD-10-CM | POA: Insufficient documentation

## 2018-08-25 DIAGNOSIS — L97822 Non-pressure chronic ulcer of other part of left lower leg with fat layer exposed: Secondary | ICD-10-CM | POA: Insufficient documentation

## 2018-08-25 DIAGNOSIS — I714 Abdominal aortic aneurysm, without rupture, unspecified: Secondary | ICD-10-CM

## 2018-08-28 ENCOUNTER — Ambulatory Visit (INDEPENDENT_AMBULATORY_CARE_PROVIDER_SITE_OTHER): Payer: Medicare Other | Admitting: Pharmacist Clinician (PhC)/ Clinical Pharmacy Specialist

## 2018-08-28 DIAGNOSIS — I482 Chronic atrial fibrillation, unspecified: Secondary | ICD-10-CM

## 2018-08-28 DIAGNOSIS — Z7901 Long term (current) use of anticoagulants: Secondary | ICD-10-CM

## 2018-08-28 LAB — POCT INR: INR: 1.5 — AB (ref 2.0–3.0)

## 2018-08-28 NOTE — Patient Instructions (Signed)
Description   Take 1 tablet Monday Jan 6 and Tuesday Jan 7, then continue taking 1/2 tablet daily, repeat INR in 2 weeks.

## 2018-09-01 DIAGNOSIS — I87312 Chronic venous hypertension (idiopathic) with ulcer of left lower extremity: Secondary | ICD-10-CM | POA: Diagnosis not present

## 2018-09-08 DIAGNOSIS — I87312 Chronic venous hypertension (idiopathic) with ulcer of left lower extremity: Secondary | ICD-10-CM | POA: Diagnosis not present

## 2018-09-13 ENCOUNTER — Ambulatory Visit (INDEPENDENT_AMBULATORY_CARE_PROVIDER_SITE_OTHER): Payer: Medicare Other | Admitting: Pharmacist Clinician (PhC)/ Clinical Pharmacy Specialist

## 2018-09-13 DIAGNOSIS — I482 Chronic atrial fibrillation, unspecified: Secondary | ICD-10-CM | POA: Diagnosis not present

## 2018-09-13 DIAGNOSIS — Z7901 Long term (current) use of anticoagulants: Secondary | ICD-10-CM | POA: Diagnosis not present

## 2018-09-13 LAB — POCT INR: INR: 2 (ref 2.0–3.0)

## 2018-09-22 DIAGNOSIS — I87312 Chronic venous hypertension (idiopathic) with ulcer of left lower extremity: Secondary | ICD-10-CM | POA: Diagnosis not present

## 2018-09-27 ENCOUNTER — Ambulatory Visit (INDEPENDENT_AMBULATORY_CARE_PROVIDER_SITE_OTHER): Payer: Medicare Other | Admitting: Pharmacist

## 2018-09-27 DIAGNOSIS — I482 Chronic atrial fibrillation, unspecified: Secondary | ICD-10-CM | POA: Diagnosis not present

## 2018-09-27 DIAGNOSIS — Z7901 Long term (current) use of anticoagulants: Secondary | ICD-10-CM

## 2018-09-27 LAB — POCT INR: INR: 1.7 — AB (ref 2.0–3.0)

## 2018-10-02 ENCOUNTER — Ambulatory Visit (INDEPENDENT_AMBULATORY_CARE_PROVIDER_SITE_OTHER): Payer: Medicare Other | Admitting: Physician Assistant

## 2018-10-02 ENCOUNTER — Ambulatory Visit (HOSPITAL_COMMUNITY)
Admission: RE | Admit: 2018-10-02 | Discharge: 2018-10-02 | Disposition: A | Discharge status: Home / Self Care | Payer: Medicare Other | Source: Ambulatory Visit | Attending: Surgery | Admitting: Surgery

## 2018-10-02 ENCOUNTER — Other Ambulatory Visit: Payer: Self-pay

## 2018-10-02 ENCOUNTER — Encounter: Payer: Self-pay | Admitting: Family

## 2018-10-02 VITALS — BP 157/75 | HR 62 | Temp 96.0°F | Resp 16 | Ht 67.0 in | Wt 150.0 lb

## 2018-10-02 DIAGNOSIS — I714 Abdominal aortic aneurysm, without rupture, unspecified: Secondary | ICD-10-CM

## 2018-10-02 NOTE — Progress Notes (Signed)
    Established EVAR   History of Present Illness   Gary Valencia is a 85 y.o. (Nov 30, 1922) male who presents for routine follow up s/p EVAR by Dr. Trula Slade  (Date: 11/2015).  Most recent EVAR duplex 6 months prior demonstrates: 4.8 cm sac size.  The patient has not had new or changing back or abdominal pain.  He also denies rest pain or tissue loss bilateral lower extremities.  He does have chronic edema bilateral lower extremities for which he manages with compression stockings.  Surgical history also significant for CABG about 20 years ago.  He takes coumadin for atrial fibrillation.  He takes a statin daily.  The patient's PMH, PSH, SH, and FamHx were reviewed and are unchanged from prior visit.  Current Outpatient Medications  Medication Sig Dispense Refill  . Cod Liver Oil 1250-135 UNITS CAPS Take 2 capsules by mouth daily.     . metoprolol tartrate (LOPRESSOR) 25 MG tablet TAKE ONE-HALF (1/2) TABLET TWICE A DAY 90 tablet 3  . Multiple Vitamin (MULITIVITAMIN WITH MINERALS) TABS Take 1 tablet by mouth daily.    . rosuvastatin (CRESTOR) 10 MG tablet Take 1 tablet (10 mg total) by mouth daily. 90 tablet 3  . warfarin (COUMADIN) 5 MG tablet TAKE ONE-HALF (1/2) TO ONE TABLET DAILY AS DIRECTED BY COUMADIN CLINIC 90 tablet 1  . amoxicillin-clavulanate (AUGMENTIN) 875-125 MG tablet Take 1 tablet by mouth 2 (two) times daily. (Patient not taking: Reported on 10/02/2018) 20 tablet 0   No current facility-administered medications for this visit.     On ROS today: 10 system ROS is otherwise negative   Physical Examination   Vitals:   10/02/18 0835  BP: (!) 157/75  Pulse: 62  Resp: 16  Temp: (!) 96 F (35.6 C)  TempSrc: Oral  SpO2: 100%  Weight: 150 lb (68 kg)  Height: 5\' 7"  (1.702 m)   Body mass index is 23.49 kg/m.  General Alert, O x 3, WD, NAD  Pulmonary Sym exp, good B air movt  Cardiac RRR, Nl S1, S2  Vascular Vessel Right Left  Radial Palpable Palpable  Carotid  Palpable, No Bruit Palpable, No Bruit  Aorta Not palpable N/A  Popliteal Not palpable Not palpable  PT Not palpable Not palpable  DP Palpable Faintly palpable    Gastro- intestinal soft, non-distended, non-tender to palpation, No guarding or rebound,   Musculo- skeletal M/S 5/5 throughout  , Extremities without ischemic changes, pitting edema to the level of the mid shin bilaterally  Neurologic Pain and light touch intact in extremities , Motor exam as listed above    Non-Invasive Vascular Imaging   EVAR Duplex (10/02/18)  AAA sac size: 4.68 cm x 4.9 cm  no endoleak detected   Medical Decision Making   [name] is a 85 y.o. male who presents s/p EVAR.  Pt is asymptomatic with 4.9cm sac size.   EVAR duplex unchanged over the past couple years  Recheck EVAR duplex annually  Continue daily statin  Continue compression and elevation for chronic lower extremity edema  Follow regularly with PCP for other medical conditions   Dagoberto Ligas PA-C [facility] [place] Office: [phone]  Clinic MD: Dr. Trula Slade

## 2018-10-12 ENCOUNTER — Ambulatory Visit (INDEPENDENT_AMBULATORY_CARE_PROVIDER_SITE_OTHER): Payer: Medicare Other | Admitting: Pharmacist Clinician (PhC)/ Clinical Pharmacy Specialist

## 2018-10-12 DIAGNOSIS — I482 Chronic atrial fibrillation, unspecified: Secondary | ICD-10-CM | POA: Diagnosis not present

## 2018-10-12 DIAGNOSIS — Z7901 Long term (current) use of anticoagulants: Secondary | ICD-10-CM

## 2018-10-12 LAB — POCT INR: INR: 2.2 (ref 2.0–3.0)

## 2018-10-13 LAB — POCT INR: INR: 3.1 — AB (ref <=1.1)

## 2018-10-13 LAB — PROTIME-INR: Protime: 36 — AB (ref 10.0–13.8)

## 2018-11-02 ENCOUNTER — Other Ambulatory Visit: Payer: Self-pay

## 2018-11-02 ENCOUNTER — Ambulatory Visit (INDEPENDENT_AMBULATORY_CARE_PROVIDER_SITE_OTHER): Payer: Medicare Other | Admitting: Pharmacist Clinician (PhC)/ Clinical Pharmacy Specialist

## 2018-11-02 DIAGNOSIS — I482 Chronic atrial fibrillation, unspecified: Secondary | ICD-10-CM | POA: Diagnosis not present

## 2018-11-02 DIAGNOSIS — Z7901 Long term (current) use of anticoagulants: Secondary | ICD-10-CM | POA: Diagnosis not present

## 2018-11-02 LAB — POCT INR: INR: 2.1 (ref 2.0–3.0)

## 2018-11-12 ENCOUNTER — Other Ambulatory Visit: Payer: Self-pay | Admitting: Cardiovascular Disease

## 2018-11-27 ENCOUNTER — Encounter: Payer: Self-pay | Admitting: Family Medicine

## 2018-11-27 ENCOUNTER — Other Ambulatory Visit: Payer: Self-pay

## 2018-11-27 ENCOUNTER — Ambulatory Visit (INDEPENDENT_AMBULATORY_CARE_PROVIDER_SITE_OTHER): Payer: Medicare Other | Admitting: Family Medicine

## 2018-11-27 VITALS — BP 110/74 | HR 60 | Temp 97.6°F

## 2018-11-27 DIAGNOSIS — R339 Retention of urine, unspecified: Secondary | ICD-10-CM

## 2018-11-27 DIAGNOSIS — R531 Weakness: Secondary | ICD-10-CM | POA: Diagnosis not present

## 2018-11-27 DIAGNOSIS — S0990XA Unspecified injury of head, initial encounter: Secondary | ICD-10-CM

## 2018-11-27 DIAGNOSIS — Z8546 Personal history of malignant neoplasm of prostate: Secondary | ICD-10-CM

## 2018-11-27 DIAGNOSIS — R4781 Slurred speech: Secondary | ICD-10-CM

## 2018-11-27 NOTE — Progress Notes (Signed)
   Subjective:    Patient ID: Gary Valencia, male    DOB: Oct 09, 1922, 85 y.o.   MRN: [id number]  HPI He is here for consult concerning multiple issues.  Last Monday he was noted to have a weak voice as well as slurred speech that apparently was intermittent in nature.  No motor weakness was noted.  He did fall last Wednesday hitting the occipital area of his head as well as right shoulder and left forearm.  The forearm did cause some bleeding and was cleaned and dressed at that time.  On Saturday he developed double vision that did clear.  His daughter-in-law did call and I recommended starting on 1 aspirin per day.  He has had no numbness, tingling or other focal neurologic symptoms.  No headache, vomiting but some confusion as to time and date.  No fever, cough, shortness of breath. At the end of the encounter he then mentioned a 57-month history of difficulty with incomplete emptying.  He has a previous history of prostate cancer and apparently had to have a revision due to some scarring several years ago.  He did see Dr. Risa Grill.  His daughter-in-law is here with him.   Review of Systems     Objective:   Physical Exam Alert and slight slurring of the speech is noted.  Contusion and healing abrasion is noted to the right parieto-occipital area.  EOMI.  Slight slurring of speech is noted.  Moves all extremities.  Does complain of some pain with motion of the right arm actively but passive motion was fairly good.  No tenderness over the glenohumeral joint with slight tenderness over the mid humeral area.  Elbow motion is normal with no evidence of joint effusion.  Discoloration noted laterally to the proximal forearm. The left forearm was evaluated and a superficial abrasion/laceration is noted to that area.  No evidence of warmth or tenderness is noted.  Full motion of the elbow.       Assessment & Plan:  Slurred speech - Plan: CBC with Differential/Platelet, Comprehensive metabolic panel   Traumatic injury of head, initial encounter - Plan: CBC with Differential/Platelet  Weakness - Plan: CBC with Differential/Platelet, Comprehensive metabolic panel  History of prostate cancer  Incomplete emptying of bladder Information given to his daughter-in-law. if he gets a headache or vomiting or worsening of who he is and where he is at, call me.  I explained the fact that he could potentially have a subdural and watching for worsening of his symptoms would be appropriate. Continue with the baby aspirin use the urinal so he will not have to get out of bed.  Referral will be made for urology.  Discussed the possibility of urinary retention with the daughter-in-law and what to do if that happens. I explained that at the present time the slurred speech could possibly be coming from a CVA and at this point one baby aspirin is appropriate. With daughter-in-law was instructed to call if any difficulties.

## 2018-11-27 NOTE — Patient Instructions (Addendum)
If he gets a headache or vomiting or worsening of who he is and where he is at, call me. Continue with the baby aspirin use the urinal

## 2018-11-28 LAB — CBC WITH DIFFERENTIAL/PLATELET
Basophils Absolute: 0 10*3/uL (ref 0.0–0.2)
Basos: 1 %
EOS (ABSOLUTE): 0.2 10*3/uL (ref 0.0–0.4)
Eos: 2 %
Hematocrit: 27.3 % — ABNORMAL LOW (ref 37.5–51.0)
Hemoglobin: 8.9 g/dL — ABNORMAL LOW (ref 13.0–17.7)
Immature Grans (Abs): 0 10*3/uL (ref 0.0–0.1)
Immature Granulocytes: 0 %
Lymphocytes Absolute: 0.5 10*3/uL — ABNORMAL LOW (ref 0.7–3.1)
Lymphs: 8 %
MCH: 29.9 pg (ref 26.6–33.0)
MCHC: 32.6 g/dL (ref 31.5–35.7)
MCV: 92 fL (ref 79–97)
Monocytes Absolute: 0.7 10*3/uL (ref 0.1–0.9)
Monocytes: 11 %
Neutrophils Absolute: 5.1 10*3/uL (ref 1.4–7.0)
Neutrophils: 78 %
Platelets: 183 10*3/uL (ref 150–450)
RBC: 2.98 x10E6/uL — ABNORMAL LOW (ref 4.14–5.80)
RDW: 15.6 % — ABNORMAL HIGH (ref 11.6–15.4)
WBC: 6.5 10*3/uL (ref 3.4–10.8)

## 2018-11-28 LAB — COMPREHENSIVE METABOLIC PANEL
ALT: 15 IU/L (ref 0–44)
AST: 32 IU/L (ref 0–40)
Albumin/Globulin Ratio: 1 — ABNORMAL LOW (ref 1.2–2.2)
Albumin: 3.2 g/dL — ABNORMAL LOW (ref 3.5–4.6)
Alkaline Phosphatase: 125 IU/L — ABNORMAL HIGH (ref 39–117)
BUN/Creatinine Ratio: 21 (ref 10–24)
BUN: 22 mg/dL (ref 10–36)
Bilirubin Total: 0.4 mg/dL (ref 0.0–1.2)
CO2: 19 mmol/L — ABNORMAL LOW (ref 20–29)
Calcium: 8.4 mg/dL — ABNORMAL LOW (ref 8.6–10.2)
Chloride: 102 mmol/L (ref 96–106)
Creatinine, Ser: 1.07 mg/dL (ref 0.76–1.27)
GFR calc Af Amer: 69 mL/min/{1.73_m2} (ref >59)
GFR calc non Af Amer: 60 mL/min/{1.73_m2} (ref >59)
Globulin, Total: 3.3 g/dL (ref 1.5–4.5)
Glucose: 129 mg/dL — ABNORMAL HIGH (ref 65–99)
Potassium: 4.3 mmol/L (ref 3.5–5.2)
Sodium: 138 mmol/L (ref 134–144)
Total Protein: 6.5 g/dL (ref 6.0–8.5)

## 2018-11-29 ENCOUNTER — Other Ambulatory Visit: Payer: Self-pay

## 2018-11-29 ENCOUNTER — Telehealth: Payer: Self-pay | Admitting: Family Medicine

## 2018-11-29 DIAGNOSIS — S0990XA Unspecified injury of head, initial encounter: Secondary | ICD-10-CM

## 2018-11-29 DIAGNOSIS — R531 Weakness: Secondary | ICD-10-CM

## 2018-11-29 DIAGNOSIS — R4781 Slurred speech: Secondary | ICD-10-CM

## 2018-11-29 NOTE — Telephone Encounter (Signed)
Set up home health visit to evaluate what his needs are.  Also getting into see neurology for slurred speech and weakness.  Make sure the son and daughter-in-law know.

## 2018-11-29 NOTE — Telephone Encounter (Signed)
Done and pt daughter in law was called and advised [facility]

## 2018-11-29 NOTE — Telephone Encounter (Signed)
Manus Gunning called and states she spoke with Dr Redmond School and pt has had a stroke and need Home Health and to be evaluated.  Patient cannot take care of himself and his wife cannot take care of him.

## 2018-12-04 ENCOUNTER — Telehealth: Payer: Self-pay | Admitting: Family Medicine

## 2018-12-04 NOTE — Telephone Encounter (Signed)
[place] with [facility] wants a verbal ok of 4 weeks of PT at 2 times per day.

## 2018-12-05 ENCOUNTER — Telehealth: Payer: Self-pay

## 2018-12-05 NOTE — Telephone Encounter (Signed)
[facility] is requesting to have speech therapy for one week one visit, two week two visit and one week one visit. Please advise

## 2018-12-05 NOTE — Telephone Encounter (Signed)
[facility] is also requesting to do a mini- cog exam . Please advise  [facility]

## 2018-12-05 NOTE — Telephone Encounter (Signed)
ok 

## 2018-12-05 NOTE — Telephone Encounter (Signed)
Done [facility] 

## 2018-12-05 NOTE — Telephone Encounter (Signed)
lvm with the ok for PT. [facility]

## 2018-12-06 ENCOUNTER — Telehealth: Payer: Self-pay

## 2018-12-06 NOTE — Telephone Encounter (Signed)
Called patient, spoke with wife, Gary Valencia.  Gary Valencia wished to cancel appointment for next Tuesday, 12/12/18. Advised her that it would be August 2020 before in-office visits will be scheduled. Wife verbalized understanding. Appointment cancelled and recall entered for August 2020.

## 2018-12-07 ENCOUNTER — Ambulatory Visit (INDEPENDENT_AMBULATORY_CARE_PROVIDER_SITE_OTHER): Payer: Medicare Other | Admitting: Pharmacist Clinician (PhC)/ Clinical Pharmacy Specialist

## 2018-12-07 DIAGNOSIS — Z7901 Long term (current) use of anticoagulants: Secondary | ICD-10-CM | POA: Diagnosis not present

## 2018-12-07 DIAGNOSIS — I482 Chronic atrial fibrillation, unspecified: Secondary | ICD-10-CM | POA: Diagnosis not present

## 2018-12-07 LAB — POCT INR: INR: 3.5 — AB (ref 2.0–3.0)

## 2018-12-12 ENCOUNTER — Encounter: Payer: Self-pay | Admitting: Family Medicine

## 2018-12-12 ENCOUNTER — Ambulatory Visit (INDEPENDENT_AMBULATORY_CARE_PROVIDER_SITE_OTHER): Payer: Medicare Other | Admitting: Interventional Cardiology

## 2018-12-12 ENCOUNTER — Ambulatory Visit: Payer: Medicare Other | Admitting: Cardiovascular Disease

## 2018-12-12 DIAGNOSIS — I482 Chronic atrial fibrillation, unspecified: Secondary | ICD-10-CM | POA: Diagnosis not present

## 2018-12-12 DIAGNOSIS — Z7901 Long term (current) use of anticoagulants: Secondary | ICD-10-CM | POA: Diagnosis not present

## 2018-12-12 LAB — POCT INR: INR: 3.1 — AB (ref 2.0–3.0)

## 2018-12-19 ENCOUNTER — Ambulatory Visit (INDEPENDENT_AMBULATORY_CARE_PROVIDER_SITE_OTHER): Payer: Medicare Other | Admitting: Pharmacist

## 2018-12-19 ENCOUNTER — Other Ambulatory Visit: Payer: Self-pay | Admitting: Cardiovascular Disease

## 2018-12-19 DIAGNOSIS — Z7901 Long term (current) use of anticoagulants: Secondary | ICD-10-CM | POA: Diagnosis not present

## 2018-12-19 DIAGNOSIS — I482 Chronic atrial fibrillation, unspecified: Secondary | ICD-10-CM | POA: Diagnosis not present

## 2018-12-19 LAB — POCT INR: INR: 3.9 — AB (ref 2.0–3.0)

## 2018-12-19 MED ORDER — WARFARIN SODIUM 2.5 MG PO TABS: 2.5000 mg | ORAL_TABLET | Freq: Every day | ORAL | 1 refills | Status: DC

## 2018-12-19 NOTE — Telephone Encounter (Signed)
Rosuvastatin 10 mg refilled.

## 2018-12-21 ENCOUNTER — Encounter: Payer: Self-pay | Admitting: Family Medicine

## 2018-12-26 ENCOUNTER — Ambulatory Visit (INDEPENDENT_AMBULATORY_CARE_PROVIDER_SITE_OTHER): Payer: Medicare Other | Admitting: Pharmacist Clinician (PhC)/ Clinical Pharmacy Specialist

## 2018-12-26 ENCOUNTER — Telehealth: Payer: Self-pay | Admitting: Cardiovascular Disease

## 2018-12-26 ENCOUNTER — Encounter: Payer: Self-pay | Admitting: Pharmacist

## 2018-12-26 DIAGNOSIS — Z7901 Long term (current) use of anticoagulants: Secondary | ICD-10-CM | POA: Diagnosis not present

## 2018-12-26 DIAGNOSIS — I482 Chronic atrial fibrillation, unspecified: Secondary | ICD-10-CM

## 2018-12-26 LAB — POCT INR: INR: 4.9 — AB (ref 2.0–3.0)

## 2018-12-26 NOTE — Progress Notes (Signed)
This encounter was created in error - please disregard.  This encounter was created in error - please disregard.

## 2018-12-26 NOTE — Telephone Encounter (Signed)
See anticoag encounter

## 2018-12-26 NOTE — Telephone Encounter (Signed)
Patty, LPN with [facility]  PT 54.1  INR 4.9  Please call Patty back @ [phone]  Routed to [facility] pharmacist

## 2018-12-27 ENCOUNTER — Telehealth: Payer: Self-pay

## 2018-12-27 NOTE — Telephone Encounter (Signed)
nurse is requesting for OT  1 for one week than 2 times a week for two weeks Please advise [facility]

## 2018-12-27 NOTE — Telephone Encounter (Signed)
ok 

## 2018-12-28 ENCOUNTER — Other Ambulatory Visit: Payer: Self-pay | Admitting: Cardiovascular Disease

## 2018-12-28 NOTE — Telephone Encounter (Signed)
Lopressor 25 mg refilled 

## 2018-12-28 NOTE — Telephone Encounter (Signed)
LVM of ok. [place]

## 2019-01-01 ENCOUNTER — Telehealth: Payer: Self-pay | Admitting: Cardiovascular Disease

## 2019-01-01 NOTE — Telephone Encounter (Signed)
New message:   Jenny Reichmann calling to report patient INR 3.3 and 45.9 pt please call back. Also would like to know was the order received.

## 2019-01-02 ENCOUNTER — Ambulatory Visit (INDEPENDENT_AMBULATORY_CARE_PROVIDER_SITE_OTHER): Payer: Medicare Other | Admitting: Pharmacist Clinician (PhC)/ Clinical Pharmacy Specialist

## 2019-01-02 ENCOUNTER — Other Ambulatory Visit: Payer: Self-pay | Admitting: Cardiovascular Disease

## 2019-01-02 ENCOUNTER — Telehealth: Payer: Self-pay | Admitting: Cardiovascular Disease

## 2019-01-02 DIAGNOSIS — I482 Chronic atrial fibrillation, unspecified: Secondary | ICD-10-CM | POA: Diagnosis not present

## 2019-01-02 DIAGNOSIS — Z7901 Long term (current) use of anticoagulants: Secondary | ICD-10-CM

## 2019-01-02 LAB — POCT INR: INR: 3.3 — AB (ref 2.0–3.0)

## 2019-01-02 MED ORDER — METOPROLOL TARTRATE 25 MG PO TABS: 12.5000 mg | ORAL_TABLET | Freq: Two times a day (BID) | ORAL | 0 refills | Status: DC

## 2019-01-02 NOTE — Telephone Encounter (Signed)
See anticoag note

## 2019-01-02 NOTE — Telephone Encounter (Signed)
Spoke with patient.  HH RN called daughter-in-law this morning to make adjustments to warfarin.  Stanton Kidney wanted to know if we had result of stool hemoccult that was done Friday.  That has not resulted in Epic at this time, will have RN at office be on the lookout as it may result with fax.

## 2019-01-02 NOTE — Telephone Encounter (Signed)
 *  STAT* If patient is at the pharmacy, call can be transferred to refill team.   1. Which medications need to be refilled? (please list name of each medication and dose if known) metoprolol tartrate (LOPRESSOR) 25 MG tablet  2. Which pharmacy/location (including street and city if local pharmacy) is medication to be sent to? [facility]  3. Do they need a 30 day or 90 day supply? 90 days

## 2019-01-02 NOTE — Telephone Encounter (Signed)
Wife is calling for lab work results

## 2019-01-05 ENCOUNTER — Encounter: Payer: Self-pay | Admitting: Family Medicine

## 2019-01-05 ENCOUNTER — Encounter (HOSPITAL_BASED_OUTPATIENT_CLINIC_OR_DEPARTMENT_OTHER): Payer: Self-pay | Admitting: Adult Health

## 2019-01-05 ENCOUNTER — Emergency Department (HOSPITAL_BASED_OUTPATIENT_CLINIC_OR_DEPARTMENT_OTHER): Payer: Medicare Other

## 2019-01-05 ENCOUNTER — Other Ambulatory Visit: Payer: Self-pay

## 2019-01-05 ENCOUNTER — Inpatient Hospital Stay (HOSPITAL_BASED_OUTPATIENT_CLINIC_OR_DEPARTMENT_OTHER)
Admission: EM | Admit: 2019-01-05 | Discharge: 2019-01-08 | DRG: 377 | Disposition: A | Discharge status: Home Health | Payer: Medicare Other | Attending: Family Medicine | Admitting: Family Medicine

## 2019-01-05 ENCOUNTER — Telehealth: Payer: Self-pay

## 2019-01-05 DIAGNOSIS — I4891 Unspecified atrial fibrillation: Secondary | ICD-10-CM | POA: Diagnosis present

## 2019-01-05 DIAGNOSIS — D500 Iron deficiency anemia secondary to blood loss (chronic): Secondary | ICD-10-CM | POA: Diagnosis present

## 2019-01-05 DIAGNOSIS — Z888 Allergy status to other drugs, medicaments and biological substances status: Secondary | ICD-10-CM | POA: Diagnosis not present

## 2019-01-05 DIAGNOSIS — K5790 Diverticulosis of intestine, part unspecified, without perforation or abscess without bleeding: Secondary | ICD-10-CM | POA: Diagnosis present

## 2019-01-05 DIAGNOSIS — R531 Weakness: Secondary | ICD-10-CM | POA: Diagnosis present

## 2019-01-05 DIAGNOSIS — L03116 Cellulitis of left lower limb: Secondary | ICD-10-CM

## 2019-01-05 DIAGNOSIS — Z8546 Personal history of malignant neoplasm of prostate: Secondary | ICD-10-CM

## 2019-01-05 DIAGNOSIS — R1313 Dysphagia, pharyngeal phase: Secondary | ICD-10-CM | POA: Diagnosis present

## 2019-01-05 DIAGNOSIS — Z23 Encounter for immunization: Secondary | ICD-10-CM | POA: Diagnosis present

## 2019-01-05 DIAGNOSIS — I10 Essential (primary) hypertension: Secondary | ICD-10-CM | POA: Diagnosis present

## 2019-01-05 DIAGNOSIS — Z8249 Family history of ischemic heart disease and other diseases of the circulatory system: Secondary | ICD-10-CM | POA: Diagnosis not present

## 2019-01-05 DIAGNOSIS — Z923 Personal history of irradiation: Secondary | ICD-10-CM

## 2019-01-05 DIAGNOSIS — J189 Pneumonia, unspecified organism: Secondary | ICD-10-CM | POA: Diagnosis present

## 2019-01-05 DIAGNOSIS — Z8261 Family history of arthritis: Secondary | ICD-10-CM

## 2019-01-05 DIAGNOSIS — I2510 Atherosclerotic heart disease of native coronary artery without angina pectoris: Secondary | ICD-10-CM | POA: Diagnosis present

## 2019-01-05 DIAGNOSIS — J181 Lobar pneumonia, unspecified organism: Secondary | ICD-10-CM | POA: Diagnosis not present

## 2019-01-05 DIAGNOSIS — D649 Anemia, unspecified: Secondary | ICD-10-CM | POA: Diagnosis present

## 2019-01-05 DIAGNOSIS — Z7982 Long term (current) use of aspirin: Secondary | ICD-10-CM | POA: Diagnosis not present

## 2019-01-05 DIAGNOSIS — Z886 Allergy status to analgesic agent status: Secondary | ICD-10-CM | POA: Diagnosis not present

## 2019-01-05 DIAGNOSIS — K921 Melena: Secondary | ICD-10-CM | POA: Diagnosis present

## 2019-01-05 DIAGNOSIS — E785 Hyperlipidemia, unspecified: Secondary | ICD-10-CM | POA: Diagnosis present

## 2019-01-05 DIAGNOSIS — Z951 Presence of aortocoronary bypass graft: Secondary | ICD-10-CM | POA: Diagnosis not present

## 2019-01-05 DIAGNOSIS — R0600 Dyspnea, unspecified: Secondary | ICD-10-CM

## 2019-01-05 DIAGNOSIS — Z7901 Long term (current) use of anticoagulants: Secondary | ICD-10-CM

## 2019-01-05 DIAGNOSIS — I69391 Dysphagia following cerebral infarction: Secondary | ICD-10-CM | POA: Diagnosis not present

## 2019-01-05 DIAGNOSIS — L8990 Pressure ulcer of unspecified site, unspecified stage: Secondary | ICD-10-CM

## 2019-01-05 DIAGNOSIS — Z20828 Contact with and (suspected) exposure to other viral communicable diseases: Secondary | ICD-10-CM | POA: Diagnosis present

## 2019-01-05 DIAGNOSIS — I69322 Dysarthria following cerebral infarction: Secondary | ICD-10-CM | POA: Diagnosis not present

## 2019-01-05 HISTORY — DX: Cerebral infarction, unspecified: I63.9

## 2019-01-05 LAB — COMPREHENSIVE METABOLIC PANEL
ALT: 13 U/L (ref 0–44)
AST: 21 U/L (ref 15–41)
Albumin: 2.6 g/dL — ABNORMAL LOW (ref 3.5–5.0)
Alkaline Phosphatase: 127 U/L — ABNORMAL HIGH (ref 38–126)
Anion gap: 7 (ref 5–15)
BUN: 22 mg/dL (ref 8–23)
CO2: 22 mmol/L (ref 22–32)
Calcium: 8 mg/dL — ABNORMAL LOW (ref 8.9–10.3)
Chloride: 107 mmol/L (ref 98–111)
Creatinine, Ser: 1.11 mg/dL (ref 0.61–1.24)
GFR calc Af Amer: >60 mL/min (ref >60)
GFR calc non Af Amer: 57 mL/min — ABNORMAL LOW (ref >60)
Glucose, Bld: 128 mg/dL — ABNORMAL HIGH (ref 70–99)
Potassium: 4.3 mmol/L (ref 3.5–5.1)
Sodium: 136 mmol/L (ref 135–145)
Total Bilirubin: 0.4 mg/dL (ref 0.3–1.2)
Total Protein: 6.9 g/dL (ref 6.5–8.1)

## 2019-01-05 LAB — PREPARE RBC (CROSSMATCH)

## 2019-01-05 LAB — CBC
HCT: 21.1 % — ABNORMAL LOW (ref 39.0–52.0)
HCT: 25.7 % — ABNORMAL LOW (ref 39.0–52.0)
Hemoglobin: 6 g/dL — CL (ref 13.0–17.0)
Hemoglobin: 7.5 g/dL — ABNORMAL LOW (ref 13.0–17.0)
MCH: 27.9 pg (ref 26.0–34.0)
MCH: 28.3 pg (ref 26.0–34.0)
MCHC: 28.4 g/dL — ABNORMAL LOW (ref 30.0–36.0)
MCHC: 29.2 g/dL — ABNORMAL LOW (ref 30.0–36.0)
MCV: 98.1 fL (ref 80.0–100.0)
MCV: 97 fL (ref 80.0–100.0)
Platelets: 271 10*3/uL (ref 150–400)
Platelets: 263 10*3/uL (ref 150–400)
RBC: 2.15 MIL/uL — ABNORMAL LOW (ref 4.22–5.81)
RBC: 2.65 MIL/uL — ABNORMAL LOW (ref 4.22–5.81)
RDW: 18.2 % — ABNORMAL HIGH (ref 11.5–15.5)
RDW: 17.8 % — ABNORMAL HIGH (ref 11.5–15.5)
WBC: 7 10*3/uL (ref 4.0–10.5)
WBC: 6.5 10*3/uL (ref 4.0–10.5)
nRBC: 0 % (ref 0.0–0.2)
nRBC: 0 % (ref 0.0–0.2)

## 2019-01-05 LAB — FOLATE: Folate: 21.3 ng/mL (ref >5.9)

## 2019-01-05 LAB — RETICULOCYTES
Immature Retic Fract: 21.7 % — ABNORMAL HIGH (ref 2.3–15.9)
RBC.: 2.08 MIL/uL — ABNORMAL LOW (ref 4.22–5.81)
Retic Count, Absolute: 81.7 10*3/uL (ref 19.0–186.0)
Retic Ct Pct: 3.9 % — ABNORMAL HIGH (ref 0.4–3.1)

## 2019-01-05 LAB — VITAMIN B12: Vitamin B-12: 372 pg/mL (ref 180–914)

## 2019-01-05 LAB — PROTIME-INR
INR: 2.6 — ABNORMAL HIGH (ref 0.8–1.2)
Prothrombin Time: 27.6 seconds — ABNORMAL HIGH (ref 11.4–15.2)

## 2019-01-05 LAB — URINALYSIS, ROUTINE W REFLEX MICROSCOPIC
Bilirubin Urine: NEGATIVE
Glucose, UA: NEGATIVE mg/dL
Hgb urine dipstick: NEGATIVE
Ketones, ur: NEGATIVE mg/dL
Leukocytes,Ua: NEGATIVE
Nitrite: NEGATIVE
Protein, ur: NEGATIVE mg/dL
Specific Gravity, Urine: >1.03 — ABNORMAL HIGH (ref 1.005–1.030)
pH: 5.5 (ref 5.0–8.0)

## 2019-01-05 LAB — MRSA PCR SCREENING: MRSA by PCR: NEGATIVE

## 2019-01-05 LAB — BRAIN NATRIURETIC PEPTIDE: B Natriuretic Peptide: 563.8 pg/mL — ABNORMAL HIGH (ref 0.0–100.0)

## 2019-01-05 LAB — CBG MONITORING, ED: Glucose-Capillary: 132 mg/dL — ABNORMAL HIGH (ref 70–99)

## 2019-01-05 LAB — OCCULT BLOOD X 1 CARD TO LAB, STOOL: Fecal Occult Bld: NEGATIVE

## 2019-01-05 LAB — IRON AND TIBC
Iron: 16 ug/dL — ABNORMAL LOW (ref 45–182)
Saturation Ratios: 5 % — ABNORMAL LOW (ref 17.9–39.5)
TIBC: 321 ug/dL (ref 250–450)
UIBC: 305 ug/dL

## 2019-01-05 LAB — FERRITIN: Ferritin: 68 ng/mL (ref 24–336)

## 2019-01-05 LAB — TROPONIN I: Troponin I: 0.03 ng/mL (ref <0.03)

## 2019-01-05 LAB — SARS CORONAVIRUS 2 AG (30 MIN TAT): SARS Coronavirus 2 Ag: NEGATIVE

## 2019-01-05 MED ORDER — SODIUM CHLORIDE 0.9 % IV SOLN
INTRAVENOUS | Status: DC
  Administered 2019-01-05: 20:00:00 via INTRAVENOUS

## 2019-01-05 MED ORDER — SODIUM CHLORIDE 0.9 % IV SOLN
1.0000 g | INTRAVENOUS | Status: DC
  Administered 2019-01-05 – 2019-01-07 (×3): 1 g via INTRAVENOUS
  Filled 2019-01-05: qty 1
  Filled 2019-01-05: qty 10
  Filled 2019-01-05: qty 1
  Filled 2019-01-05: qty 10

## 2019-01-05 MED ORDER — METOPROLOL TARTRATE 25 MG PO TABS
12.5000 mg | ORAL_TABLET | Freq: Two times a day (BID) | ORAL | Status: DC
  Administered 2019-01-05 – 2019-01-08 (×6): 12.5 mg via ORAL
  Filled 2019-01-05 (×6): qty 1

## 2019-01-05 MED ORDER — ORAL CARE MOUTH RINSE
15.0000 mL | Freq: Two times a day (BID) | OROMUCOSAL | Status: DC
  Administered 2019-01-05 – 2019-01-08 (×6): 15 mL via OROMUCOSAL

## 2019-01-05 MED ORDER — ONDANSETRON HCL 4 MG/2ML IJ SOLN: 4.0000 mg | Freq: Four times a day (QID) | INTRAMUSCULAR | Status: DC | PRN

## 2019-01-05 MED ORDER — ONDANSETRON HCL 4 MG PO TABS: 4.0000 mg | ORAL_TABLET | Freq: Four times a day (QID) | ORAL | Status: DC | PRN

## 2019-01-05 MED ORDER — ACETAMINOPHEN 650 MG RE SUPP: 650.0000 mg | Freq: Four times a day (QID) | RECTAL | Status: DC | PRN

## 2019-01-05 MED ORDER — ACETAMINOPHEN 325 MG PO TABS
650.0000 mg | ORAL_TABLET | Freq: Four times a day (QID) | ORAL | Status: DC | PRN
  Administered 2019-01-08: 650 mg via ORAL
  Filled 2019-01-05: qty 2

## 2019-01-05 MED ORDER — SODIUM CHLORIDE 0.9 % IV SOLN: 10.0000 mL/h | Freq: Once | INTRAVENOUS | Status: DC

## 2019-01-05 MED ORDER — ROSUVASTATIN CALCIUM 10 MG PO TABS
10.0000 mg | ORAL_TABLET | ORAL | Status: DC
  Administered 2019-01-06 – 2019-01-08 (×2): 10 mg via ORAL
  Filled 2019-01-05 (×2): qty 1

## 2019-01-05 MED ORDER — SODIUM CHLORIDE 0.9 % IV SOLN
500.0000 mg | INTRAVENOUS | Status: DC
  Administered 2019-01-05 – 2019-01-07 (×3): 500 mg via INTRAVENOUS
  Filled 2019-01-05 (×4): qty 500

## 2019-01-05 NOTE — ED Notes (Signed)
Went to transport pt for x-ray/CT; Dr. Ashok Cordia in room, told me to wait; spoke to charge nurse, Amy, and asked if she would call us when pt was ready

## 2019-01-05 NOTE — ED Triage Notes (Addendum)
PEr son-Father had a stroke a few months ago and has been receiving home health. Today he woke up with gurgling sounds, jumbled speech and labored breathing. Unknown LKW. HIs son noticed garbled speech and hoarseness 2 days ago and today it was much worse. HIs son reports that he lives with is wife.   Pt denies pain.

## 2019-01-05 NOTE — ED Notes (Signed)
Pt on monitor 

## 2019-01-05 NOTE — ED Notes (Signed)
Patient transported to X-ray 

## 2019-01-05 NOTE — H&P (Addendum)
TRH H&P    Patient Demographics:    Gary Valencia, is a 85 y.o. male  MRN: [id number]  DOB - 07/22/1923  Admit Date - 01/05/2019  Referring MD/NP/PA: Transferred from [facility] Primary MD for the patient is [name], MD  Patient coming from: [facility]  Chief complaint-generalized weakness   HPI:    Gary Valencia  is a 85 y.o. male, with history of recent CVA,With residual dysarthria, prostate cancer, atrial fibrillation on anticoagulation with Coumadin, dyslipidemia, CAD status post CABG was brought to the hospital for generalized weakness which has been worsening for past 1 week.  As per patient son, patient has been gradually getting worse over the past few days and today his speech was incomprehensible.  He did have gurgling sound in the lungs.  The nurse from patient's PCP office heard him on the phone and recommended to take him to the ED. Patient says that he has been having black-colored stool for past few days which actually cleared up 3 days ago.  FOBT was negative in the ED. Lab work in the ED showed hemoglobin of 6.0.  Last hemoglobin from November 27, 2018 was 8.9.  1 unit PRBC was ordered. As per patient son, patient was started on baby aspirin along with Coumadin by her PCP for stroke.  He has been getting physical therapy at home and was progressing well until recently when his speech got slurred and developed generalized weakness. He denies nausea or vomiting. Denies diarrhea. Denies chest pain or shortness of breath Also complains of difficulty swallowing, son states that he has been having coughing spells with swallowing.    Review of systems:    In addition to the HPI above,   All other systems reviewed and are negative.    Past History of the following :    Past Medical History:  Diagnosis Date  . AAA (abdominal aortic aneurysm) ([place])   .  Actinic keratoses   . Anxiety   . ASHD (arteriosclerotic heart disease) 1984  . Atrial fibrillation ([place])   . Cancer ([place]) 1999   PROSTATE  . CVA (cerebral vascular accident) ([place])   . Diverticulosis   . Dyslipidemia   . Dysrhythmia   . Personal history of colonic polyps       Past Surgical History:  Procedure Laterality Date  . ABDOMINAL AORTIC ENDOVASCULAR STENT GRAFT N/A 12/11/2015   Procedure: ABDOMINAL AORTIC ENDOVASCULAR STENT GRAFT;  Surgeon: Serafina Mitchell, MD;  Location: [facility];  Service: Vascular;  Laterality: N/A;  . CORONARY ARTERY BYPASS GRAFT  1984   [facility]- Dr. Redmond Pulling x4  . EYE SURGERY Bilateral    cataracts  . ODONTOID FRACTURE SURGERY     Odontoid screw fixation of type 2 odontoid fracture, open reduction and internal fixation of the fracture--patient had fallen down the stairs at 85 years old  . OTHER SURGICAL HISTORY  01/29/1998   Stage TIC adenocarcinoma of the prostate--Surgeon--David, Grapey M.D.--Interstital seed implantation with I-125 seeds--PSA of approximately 7  .  PROSTATE SURGERY  approx 2003   [facility] -Grapey  . TONSILLECTOMY        Social History:      Social History   Tobacco Use  . Smoking status: Never Smoker  . Smokeless tobacco: Never Used  Substance Use Topics  . Alcohol use: No       Family History :     Family History  Problem Relation Age of Onset  . Arthritis Mother   . Heart disease Father   . AAA (abdominal aortic aneurysm) Brother       Home Medications:   Prior to Admission medications   Medication Sig Start Date End Date Taking? Authorizing Provider  metoprolol tartrate (LOPRESSOR) 25 MG tablet Take 0.5 tablets (12.5 mg total) by mouth 2 (two) times daily. 01/02/19  Yes [name], [name], MD  rosuvastatin (CRESTOR) 10 MG tablet TAKE 1 TABLET DAILY Patient taking differently: Take 10 mg by mouth every other day.  12/19/18  Yes [name], [name], MD  warfarin (COUMADIN) 2.5 MG tablet Take 1 tablet (2.5 mg total) by  mouth daily. Patient taking differently: Take 1.25-2.5 mg by mouth daily. Take 2.5 mg by mouth on Tuesday, Wednesday, Friday, Saturday and Sunday. Take 1.25mg  by mouth on Monday and Thursday. 12/19/18  Yes [name], [name], MD  amoxicillin-clavulanate (AUGMENTIN) 875-125 MG tablet Take 1 tablet by mouth 2 (two) times daily. Patient not taking: Reported on 10/02/2018 06/23/18   [name], MD     Allergies:     Allergies  Allergen Reactions  . Asa Buff (Mag [Buffered Aspirin] Nausea Only  . Zetia [Ezetimibe] Other (See Comments)    Can not remember reaction     Physical Exam:   Vitals  Blood pressure (!) 150/70, pulse 62, temperature 97.9 F (36.6 C), temperature source Oral, resp. rate 16, SpO2 99 %.  1.  General: Appears lethargic  2. Psychiatric: Alert, oriented x3, seems to have intact insight and judgment  3. Neurologic: Cranial nerves II through XII grossly intact, mild dysarthria, moving all extremities  4. HEENMT:  Atraumatic normocephalic, extraocular muscles are intact  5. Respiratory : Bilateral rhonchi auscultated  6. Cardiovascular : S1-S2, regular, no murmur auscultated  7. Gastrointestinal:  Abdomen is soft, nontender, no organomegaly      Data Review:    CBC Recent Labs  Lab 01/05/19 1045  WBC 7.0  HGB 6.0*  HCT 21.1*  PLT 271  MCV 98.1  MCH 27.9  MCHC 28.4*  RDW 18.2*   ------------------------------------------------------------------------------------------------------------------  Results for orders placed or performed during the hospital encounter of 01/05/19 (from the past 48 hour(s))  CBG monitoring, ED     Status: Abnormal   Collection Time: 01/05/19 10:26 AM  Result Value Ref Range   Glucose-Capillary 132 (H) 70 - 99 mg/dL  CBC     Status: Abnormal   Collection Time: 01/05/19 10:45 AM  Result Value Ref Range   WBC 7.0 4.0 - 10.5 K/uL   RBC 2.15 (L) 4.22 - 5.81 MIL/uL   Hemoglobin 6.0 (LL) 13.0 - 17.0 g/dL    Comment:  This critical result has verified and been called to AMY HARTLEY by Wallace Going on 05 15 2020 at 1118, and has been read back.    HCT 21.1 (L) 39.0 - 52.0 %   MCV 98.1 80.0 - 100.0 fL   MCH 27.9 26.0 - 34.0 pg   MCHC 28.4 (L) 30.0 - 36.0 g/dL   RDW 18.2 (H) 11.5 - 15.5 %  Platelets 271 150 - 400 K/uL   nRBC 0.0 0.0 - 0.2 %    Comment: Performed at [facility], [address] [place], [place] [address]  Comprehensive metabolic panel     Status: Abnormal   Collection Time: 01/05/19 10:45 AM  Result Value Ref Range   Sodium 136 135 - 145 mmol/L   Potassium 4.3 3.5 - 5.1 mmol/L   Chloride 107 98 - 111 mmol/L   CO2 22 22 - 32 mmol/L   Glucose, Bld 128 (H) 70 - 99 mg/dL   BUN 22 8 - 23 mg/dL   Creatinine, Ser 1.11 0.61 - 1.24 mg/dL   Calcium 8.0 (L) 8.9 - 10.3 mg/dL   Total Protein 6.9 6.5 - 8.1 g/dL   Albumin 2.6 (L) 3.5 - 5.0 g/dL   AST 21 15 - 41 U/L   ALT 13 0 - 44 U/L   Alkaline Phosphatase 127 (H) 38 - 126 U/L   Total Bilirubin 0.4 0.3 - 1.2 mg/dL   GFR calc non Af Amer 57 (L) >60 mL/min   GFR calc Af Amer >60 >60 mL/min   Anion gap 7 5 - 15    Comment: Performed at [facility], [address] [place], [place] [address]  Protime-INR     Status: Abnormal   Collection Time: 01/05/19 10:45 AM  Result Value Ref Range   Prothrombin Time 27.6 (H) 11.4 - 15.2 seconds   INR 2.6 (H) 0.8 - 1.2    Comment: (NOTE) INR goal varies based on device and disease states. Performed at [facility], [address] [place], [place] [address]   Urinalysis, Routine w reflex microscopic     Status: Abnormal   Collection Time: 01/05/19 10:45 AM  Result Value Ref Range   Color, Urine YELLOW YELLOW   APPearance CLEAR CLEAR   Specific Gravity, Urine >1.030 (H) 1.005 - 1.030   pH 5.5 5.0 - 8.0   Glucose, UA NEGATIVE NEGATIVE mg/dL   Hgb urine dipstick NEGATIVE NEGATIVE   Bilirubin Urine NEGATIVE NEGATIVE   Ketones, ur NEGATIVE NEGATIVE mg/dL   Protein,  ur NEGATIVE NEGATIVE mg/dL   Nitrite NEGATIVE NEGATIVE   Leukocytes,Ua NEGATIVE NEGATIVE    Comment: Microscopic not done on urines with negative protein, blood, leukocytes, nitrite, or glucose < 500 mg/dL. Performed at [facility], [address] [place], [place] [address]   SARS Coronavirus 2 (Hosp order,Performed in [facility] lab via [employer] ID)     Status: None   Collection Time: 01/05/19 10:45 AM  Result Value Ref Range   SARS Coronavirus 2 ([employer] ID Now) NEGATIVE NEGATIVE    Comment: (NOTE) Interpretive Result Comment(s): COVID 19 Positive SARS CoV 2 target nucleic acids are DETECTED. The SARS CoV 2 RNA is generally detectable in upper and lower respiratory specimens during the acute phase of infection.  Positive results are indicative of active infection with SARS CoV 2.  Clinical correlation with patient history and other diagnostic information is necessary to determine patient infection status.  Positive results do not rule out bacterial infection or coinfection with other viruses. The expected result is Negative. COVID 19 Negative SARS CoV 2 target nucleic acids are NOT DETECTED. The SARS CoV 2 RNA is generally detectable in upper and lower respiratory specimens during the acute phase of infection.  Negative results do not preclude SARS CoV 2 infection, do not rule out coinfections with other pathogens, and should  not be used as the sole basis for treatment or other patient management decisions.  Negative results must be combined with clinical  observations, patient history, and epidemiological information. The expected result is Negative. Invalid Presence or absence of SARS CoV 2 nucleic acids cannot be determined. Repeat testing was performed on the submitted specimen and repeated Invalid results were obtained.  If clinically indicated, additional testing on a new specimen with an alternate test methodology [phone]) is advised.  The SARS CoV 2 RNA is  generally detectable in upper and lower respiratory specimens during the acute phase of infection. The expected result is Negative. Fact Sheet for Patients:  GolfingFamily.no Fact Sheet for Healthcare Providers: https://www.hernandez-brewer.com/ This test is not yet approved or cleared by the [place] FDA and has been authorized for detection and/or diagnosis of SARS CoV 2 by FDA under an Emergency Use Authorization (EUA).  This EUA will remain in effect (meaning this test can be used) for the duration of the COVID19 d eclaration under Section 564(b)(1) of the Act, 21 U.S.C. section 774 232 1204 3(b)(1), unless the authorization is terminated or revoked sooner. Performed at [facility], [address] [place], [place] [address]   Brain natriuretic peptide     Status: Abnormal   Collection Time: 01/05/19 10:45 AM  Result Value Ref Range   B Natriuretic Peptide 563.8 (H) 0.0 - 100.0 pg/mL    Comment: Performed at [facility], [address] [place], [place] [address]  Troponin I - ONCE - STAT     Status: Abnormal   Collection Time: 01/05/19 10:45 AM  Result Value Ref Range   Troponin I 0.03 (HH) <0.03 ng/mL    Comment: CRITICAL RESULT CALLED TO, READ BACK BY AND VERIFIED WITH: AMY HARLETY,RN AT 1126 01/05/19 BY K BARR Performed at [facility], [address] [place], [place] [address]   Occult blood card to lab, stool     Status: None   Collection Time: 01/05/19 11:25 AM  Result Value Ref Range   Fecal Occult Bld NEGATIVE NEGATIVE    Comment: Performed at [facility], [address] [place], [place] [address]  Type and screen Ordered by PROVIDER DEFAULT     Status: None (Preliminary result)   Collection Time: 01/05/19 12:05 PM  Result Value Ref Range   ABO/RH(D) PENDING    Antibody Screen PENDING    Sample Expiration 01/08/2019,2359    Unit Number [id number]    Blood Component Type RED  CELLS,LR    Unit division 00    Status of Unit ISSUED    Unit tag comment VERBAL ORDERS PER DR Lajean Saver    Transfusion Status      OK TO TRANSFUSE Performed at [facility], [address] [place], [place] [address]    Crossmatch Result PENDING   Vitamin B12     Status: None   Collection Time: 01/05/19 12:33 PM  Result Value Ref Range   Vitamin B-12 372 180 - 914 pg/mL    Comment: (NOTE) This assay is not validated for testing neonatal or myeloproliferative syndrome specimens for Vitamin B12 levels. Performed at [facility], [place] [address] [place], [place] [address]   Folate     Status: None   Collection Time: 01/05/19 12:33 PM  Result Value Ref Range   Folate 21.3 >5.9 ng/mL    Comment: Performed at [facility], [place] [address]  [place], [place] [address]  Iron and TIBC     Status: Abnormal   Collection Time: 01/05/19 12:33 PM  Result Value Ref Range   Iron 16 (L) 45 - 182 ug/dL   TIBC 321 250 - 450 ug/dL   Saturation Ratios 5 (L) 17.9 - 39.5 %   UIBC 305 ug/dL    Comment: Performed at [facility], [place] [address] [place], [place] [address]  Ferritin     Status: None   Collection Time: 01/05/19 12:33 PM  Result Value Ref Range   Ferritin 68 24 - 336 ng/mL    Comment: Performed at [facility] [address] [place], [place] [address]  Reticulocytes     Status: Abnormal   Collection Time: 01/05/19 12:33 PM  Result Value Ref Range   Retic Ct Pct 3.9 (H) 0.4 - 3.1 %   RBC. 2.08 (L) 4.22 - 5.81 MIL/uL   Retic Count, Absolute 81.7 19.0 - 186.0 K/uL   Immature Retic Fract 21.7 (H) 2.3 - 15.9 %    Comment: Performed at [facility] [address], [place], [place] [address]  MRSA PCR Screening     Status: None   Collection Time: 01/05/19  5:43 PM  Result Value Ref Range   MRSA by PCR NEGATIVE NEGATIVE    Comment:        The GeneXpert MRSA Assay (FDA approved for NASAL specimens only), is one component of a  comprehensive MRSA colonization surveillance program. It is not intended to diagnose MRSA infection nor to guide or monitor treatment for MRSA infections. Performed at [facility], [place] [address] [place], [place] [address]     Chemistries  Recent Labs  Lab 01/05/19 1045  NA 136  K 4.3  CL 107  CO2 22  GLUCOSE 128*  BUN 22  CREATININE 1.11  CALCIUM 8.0*  AST 21  ALT 13  ALKPHOS 127*  BILITOT 0.4   ------------------------------------------------------------------------------------------------------------------  ------------------------------------------------------------------------------------------------------------------ GFR: CrCl cannot be calculated (Unknown ideal weight.). Liver Function Tests: Recent Labs  Lab 01/05/19 1045  AST 21  ALT 13  ALKPHOS 127*  BILITOT 0.4  PROT 6.9  ALBUMIN 2.6*   No results for input(s): LIPASE, AMYLASE in the last 168 hours. No results for input(s): AMMONIA in the last 168 hours. Coagulation Profile: Recent Labs  Lab 01/02/19 01/05/19 1045  INR 3.3* 2.6*   Cardiac Enzymes: Recent Labs  Lab 01/05/19 1045  TROPONINI 0.03*   BNP (last 3 results) No results for input(s): PROBNP in the last 8760 hours. HbA1C: No results for input(s): HGBA1C in the last 72 hours. CBG: Recent Labs  Lab 01/05/19 1026  GLUCAP 132*   Lipid Profile: No results for input(s): CHOL, HDL, LDLCALC, TRIG, CHOLHDL, LDLDIRECT in the last 72 hours. Thyroid Function Tests: No results for input(s): TSH, T4TOTAL, FREET4, T3FREE, THYROIDAB in the last 72 hours. Anemia Panel: Recent Labs    01/05/19 1233  VITAMINB12 372  FOLATE 21.3  FERRITIN 68  TIBC 321  IRON 16*  RETICCTPCT 3.9*    --------------------------------------------------------------------------------------------------------------- Urine analysis:    Component Value Date/Time   COLORURINE YELLOW 01/05/2019 1045   APPEARANCEUR CLEAR 01/05/2019 1045    LABSPEC >1.030 (H) 01/05/2019 1045   PHURINE 5.5 01/05/2019 [place] 01/05/2019 [place] 01/05/2019 [place] 01/05/2019 [place] 01/05/2019 1045   PROTEINUR NEGATIVE 01/05/2019 1045   UROBILINOGEN 0.2 10/27/2009 1249  NITRITE NEGATIVE 01/05/2019 [place] 01/05/2019 1045      Imaging Results:    Ct Head Wo Contrast  Result Date: 01/05/2019 CLINICAL DATA:  Altered level of consciousness. EXAM: CT HEAD WITHOUT CONTRAST TECHNIQUE: Contiguous axial images were obtained from the base of the skull through the vertex without intravenous contrast. COMPARISON:  CT head 10/18/2009 FINDINGS: Brain: Moderate atrophy with mild progression. Chronic microvascular ischemic changes in the white matter. Small chronic infarct bilateral parietal lobe unchanged. No acute infarct, hemorrhage, mass. Vascular: Negative for hyperdense vessel. Atherosclerotic calcification in the cavernous carotid bilaterally. Skull: Negative Sinuses/Orbits: Negative Other: None IMPRESSION: Moderate atrophy and chronic ischemic changes. No acute abnormality. Electronically Signed   By: Franchot Gallo M.D.   On: 01/05/2019 13:13   Dg Chest Port 1 View  Result Date: 01/05/2019 CLINICAL DATA:  Shortness of breath and difficulty speaking EXAM: PORTABLE CHEST 1 VIEW COMPARISON:  09/02/2017 FINDINGS: Cardiac shadow is mildly prominent but accentuated by the portable technique. Postsurgical changes are seen. Patchy infiltrative changes are noted in both lungs. Small left effusion is noted as well. No acute bony abnormality is noted. IMPRESSION: Patchy infiltrates worst in the left base with small effusion. Electronically Signed   By: Inez Catalina M.D.   On: 01/05/2019 13:14    My personal review of EKG: Rhythm atrial fibrillation   Assessment & Plan:    Active Problems:   Severe anemia   Anemia   1. Severe anemia/melena-likely from GI bleed, recent  history of black-colored stool.  FOBT is negative in the ED.  I called [facility], discussed with Dr. Michail Sermon who will see patient in a.m.  At some point patient might benefit from endoscopy.  1 unit PRBC was ordered in the ED.  Will repeat CBC before transfusing another unit of PRBC.INR is 2.6.  2. Community-acquired pneumonia-patchy infiltrate seen in the left base with small effusion.  Concern for aspiration.  Will start ceftriaxone and Zithromax. SARS-CoV-2 is negative.  3. ?Dysphagia-there has been concern of for aspiration, will keep him n.p.o.  Obtain swallow evaluation in a.m.  4. Atrial fibrillation-heart rate is controlled, EKG shows A. fib.  Will hold anticoagulation due to concern for GI bleed as above.  Continue Coreg for rate control.  Blood pressure is stable.  5. History of CVA-we will hold both aspirin and Coumadin due to above.  6. History of CAD status post CABG-stable.  Continue statin.  Aspirin on hold as above.    DVT Prophylaxis-   SCDs   AM Labs Ordered, also please review Full Orders  Family Communication: Admission, patients condition and plan of care including tests being ordered have been discussed with the patient and his son on phone who indicate understanding and agree with the plan and Code Status.  Code Status: Partial code, CPR only, no intubation and mechanical ventilation  Admission status: Inpatient: Based on patients clinical presentation and evaluation of above clinical data, I have made determination that patient meets Inpatient criteria at this time.  Time spent in minutes : 60 minutes   Oswald Hillock M.D on 01/05/2019 at 7:01 PM

## 2019-01-05 NOTE — Telephone Encounter (Signed)
Called pt son per JCL to have pt to come in. Due to sob he will be going to the hospital. [facility]

## 2019-01-05 NOTE — ED Notes (Signed)
Pts son, Jaken, [place]. Was given a telephone update on pts results and admission info.Marland Kitchen  His number is [phone].

## 2019-01-05 NOTE — Progress Notes (Signed)
Blood bank called regarding unit of blood ready for this patient. Repeat Hgb after transfusion at [facility] 7.5. [name], MD note from earlier did not specify if Hgb goal was 7 or 8. Bodenheimer, NP paged regarding whether or not another unit of blood was needed. VSS and patient in NAD. It was decided that another unit is not necessary at this time. Will continue to closely monitor patient.

## 2019-01-05 NOTE — ED Notes (Signed)
Report to [facility]. Attempted to call report to ICU

## 2019-01-05 NOTE — ED Provider Notes (Signed)
[facility] EMERGENCY DEPARTMENT Provider Note   CSN: [id number] Arrival date & time: 01/05/19  1008    History   Chief Complaint Chief Complaint  Patient presents with  . Altered Mental Status    HPI Gary Valencia is a 85 y.o. male.     Patient with hx afib, on anticoag therapy, presents after family member noted for 2 days patient seemed generally weak, speech seemed to sound different/thick, and today seemed to have more labored breathing. Patient does indicates he feels mildly sob, and otherwise denies specific c/o. He indicates he generally is feeling okay other than his breathing being a little short. Patient denies headache. No chest pain or discomfort. Denies cough or uri symptoms. No known covid+ exposure. Denies abd pain or nvd. No dysuria or gu c/o. States compliant w home meds. Denies any abnormal bleeding or blood loss. Denies change in meds or new meds. No trauma or fall.   The history is provided by the patient and a relative.  Altered Mental Status  Presenting symptoms: no confusion   Associated symptoms: no abdominal pain, no fever, no headaches, no rash, no vomiting and no weakness     Past Medical History:  Diagnosis Date  . AAA (abdominal aortic aneurysm) ([place])   . Actinic keratoses   . Anxiety   . ASHD (arteriosclerotic heart disease) 1984  . Atrial fibrillation ([place])   . Cancer ([place]) 1999   PROSTATE  . CVA (cerebral vascular accident) ([place])   . Diverticulosis   . Dyslipidemia   . Dysrhythmia   . Personal history of colonic polyps     Patient Active Problem List   Diagnosis Date Noted  . Herpes labialis 09/08/2017  . Personal history of colonic polyps   . Dysrhythmia   . Dyslipidemia   . Diverticulosis   . Atrial fibrillation ([place])   . Anxiety   . Actinic keratoses   . AAA (abdominal aortic aneurysm) without rupture ([place]) 12/11/2015  . CAD - S/P CABG in 1984 09/13/2012  . HTN (hypertension) 09/13/2012  . Chronic  anticoagulation -Warfarin 09/13/2012  . Palpitations 09/13/2012  . Atrial fibrillation, permanent 10/11/2011  . ASHD (arteriosclerotic heart disease) 10/11/2011  . History of prostate cancer 10/11/2011  . AAA (4.09 cm on last [place] -2013) 10/11/2011  . Hyperlipidemia 10/11/2011  . Cancer ([place]) 08/23/1997    Past Surgical History:  Procedure Laterality Date  . ABDOMINAL AORTIC ENDOVASCULAR STENT GRAFT N/A 12/11/2015   Procedure: ABDOMINAL AORTIC ENDOVASCULAR STENT GRAFT;  Surgeon: Serafina Mitchell, MD;  Location: [facility];  Service: Vascular;  Laterality: N/A;  . CORONARY ARTERY BYPASS GRAFT  1984   [facility]- Dr. Redmond Pulling x4  . EYE SURGERY Bilateral    cataracts  . ODONTOID FRACTURE SURGERY     Odontoid screw fixation of type 2 odontoid fracture, open reduction and internal fixation of the fracture--patient had fallen down the stairs at 85 years old  . OTHER SURGICAL HISTORY  01/29/1998   Stage TIC adenocarcinoma of the prostate--Surgeon--David, Grapey M.D.--Interstital seed implantation with I-125 seeds--PSA of approximately 7  . PROSTATE SURGERY  approx 2003   [place] -Grapey  . TONSILLECTOMY          Home Medications    Prior to Admission medications   Medication Sig Start Date End Date Taking? Authorizing Provider  amoxicillin-clavulanate (AUGMENTIN) 875-125 MG tablet Take 1 tablet by mouth 2 (two) times daily. Patient not taking: Reported on 10/02/2018 06/23/18   [name], MD  aspirin 81 MG EC tablet Take 81 mg by mouth daily. Swallow whole.    [provider]  [facility] Liver Oil 1250-135 UNITS CAPS Take 2 capsules by mouth daily.     [provider]  metoprolol tartrate (LOPRESSOR) 25 MG tablet Take 0.5 tablets (12.5 mg total) by mouth 2 (two) times daily. 01/02/19   [name], [name], MD  Multiple Vitamin (MULITIVITAMIN WITH MINERALS) TABS Take 1 tablet by mouth daily.    [provider]  rosuvastatin (CRESTOR) 10 MG tablet TAKE 1 TABLET DAILY 12/19/18    [name], [name], MD  warfarin (COUMADIN) 2.5 MG tablet Take 1 tablet (2.5 mg total) by mouth daily. 12/19/18   [name], Dani Gobble, MD    Family History Family History  Problem Relation Age of Onset  . Arthritis Mother   . Heart disease Father   . AAA (abdominal aortic aneurysm) Brother     Social History Social History   Tobacco Use  . Smoking status: Never Smoker  . Smokeless tobacco: Never Used  Substance Use Topics  . Alcohol use: No  . Drug use: No     Allergies   Asa buff (mag [buffered aspirin] and Zetia [ezetimibe]   Review of Systems Review of Systems  Constitutional: Negative for fever.  HENT: Negative for sore throat and trouble swallowing.   Eyes: Negative for redness and visual disturbance.  Respiratory: Positive for shortness of breath. Negative for cough.   Cardiovascular: Negative for chest pain and leg swelling.  Gastrointestinal: Negative for abdominal pain, blood in stool, diarrhea and vomiting.  Endocrine: Negative for polyuria.  Genitourinary: Negative for dysuria and flank pain.  Musculoskeletal: Negative for back pain and neck pain.  Skin: Negative for rash.  Neurological: Negative for weakness, numbness and headaches.  Hematological:       On anticoag therapy.   Psychiatric/Behavioral: Negative for confusion.     Physical Exam Updated Vital Signs BP 114/65   Pulse (!) 57   Temp 98.1 F (36.7 C) (Oral)   Resp (!) 32   SpO2 95%   Physical Exam Vitals signs and nursing note reviewed.  Constitutional:      Appearance: Normal appearance. He is well-developed.  HENT:     Head: Atraumatic.     Nose: Nose normal.     Mouth/Throat:     Mouth: Mucous membranes are moist.     Pharynx: Oropharynx is clear.  Eyes:     General: No scleral icterus.    Conjunctiva/sclera: Conjunctivae normal.     Pupils: Pupils are equal, round, and reactive to light.  Neck:     Musculoskeletal: Normal range of motion and neck supple. No neck rigidity.      Trachea: No tracheal deviation.     Comments: No stiffness or rigidity. No bruits.  Cardiovascular:     Rate and Rhythm: Normal rate and regular rhythm.     Pulses: Normal pulses.     Heart sounds: Normal heart sounds. No murmur. No friction rub. No gallop.   Pulmonary:     Effort: Pulmonary effort is normal. No accessory muscle usage or respiratory distress.     Breath sounds: Normal breath sounds.  Abdominal:     General: Bowel sounds are normal. There is no distension.     Palpations: Abdomen is soft.     Tenderness: There is no abdominal tenderness. There is no guarding.  Genitourinary:    Comments: No cva tenderness. Musculoskeletal:        General: No  swelling or tenderness.  Skin:    General: Skin is warm and dry.     Findings: No rash.  Neurological:     Mental Status: He is alert and oriented to person, place, and time.     Cranial Nerves: No cranial nerve deficit.     Comments: Alert, speech is mildly dysarthric, but easy to understand. No facial weakness. Motor intact bil, stre 5/5. sens grossly intact bil. No pronator drift.   Psychiatric:        Mood and Affect: Mood normal.      ED Treatments / Results  Labs (all labs ordered are listed, but only abnormal results are displayed) Results for orders placed or performed during the hospital encounter of 01/05/19  SARS Coronavirus 2 (Hosp order,Performed in [facility] lab via [employer])  Result Value Ref Range   SARS Coronavirus 2 ([employer] Now) NEGATIVE NEGATIVE  CBC  Result Value Ref Range   WBC 7.0 4.0 - 10.5 K/uL   RBC 2.15 (L) 4.22 - 5.81 MIL/uL   Hemoglobin 6.0 (LL) 13.0 - 17.0 g/dL   HCT 21.1 (L) 39.0 - 52.0 %   MCV 98.1 80.0 - 100.0 fL   MCH 27.9 26.0 - 34.0 pg   MCHC 28.4 (L) 30.0 - 36.0 g/dL   RDW 18.2 (H) 11.5 - 15.5 %   Platelets 271 150 - 400 K/uL   nRBC 0.0 0.0 - 0.2 %  Comprehensive metabolic panel  Result Value Ref Range   Sodium 136 135 - 145 mmol/L   Potassium 4.3 3.5 - 5.1 mmol/L    Chloride 107 98 - 111 mmol/L   CO2 22 22 - 32 mmol/L   Glucose, Bld 128 (H) 70 - 99 mg/dL   BUN 22 8 - 23 mg/dL   Creatinine, Ser 1.11 0.61 - 1.24 mg/dL   Calcium 8.0 (L) 8.9 - 10.3 mg/dL   Total Protein 6.9 6.5 - 8.1 g/dL   Albumin 2.6 (L) 3.5 - 5.0 g/dL   AST 21 15 - 41 U/L   ALT 13 0 - 44 U/L   Alkaline Phosphatase 127 (H) 38 - 126 U/L   Total Bilirubin 0.4 0.3 - 1.2 mg/dL   GFR calc non Af Amer 57 (L) >60 mL/min   GFR calc Af Amer >60 >60 mL/min   Anion gap 7 5 - 15  Protime-INR  Result Value Ref Range   Prothrombin Time 27.6 (H) 11.4 - 15.2 seconds   INR 2.6 (H) 0.8 - 1.2  Urinalysis, Routine w reflex microscopic  Result Value Ref Range   Color, Urine YELLOW YELLOW   APPearance CLEAR CLEAR   Specific Gravity, Urine >1.030 (H) 1.005 - 1.030   pH 5.5 5.0 - 8.0   Glucose, UA NEGATIVE NEGATIVE mg/dL   Hgb urine dipstick NEGATIVE NEGATIVE   Bilirubin Urine NEGATIVE NEGATIVE   Ketones, ur NEGATIVE NEGATIVE mg/dL   Protein, ur NEGATIVE NEGATIVE mg/dL   Nitrite NEGATIVE NEGATIVE   Leukocytes,Ua NEGATIVE NEGATIVE  Brain natriuretic peptide  Result Value Ref Range   B Natriuretic Peptide 563.8 (H) 0.0 - 100.0 pg/mL  Troponin I - ONCE - STAT  Result Value Ref Range   Troponin I 0.03 (HH) <0.03 ng/mL  Occult blood card to lab, stool  Result Value Ref Range   Fecal Occult Bld NEGATIVE NEGATIVE  CBG monitoring, ED  Result Value Ref Range   Glucose-Capillary 132 (H) 70 - 99 mg/dL  Type and screen Ordered by PROVIDER DEFAULT  Result Value Ref Range   ABO/RH(D) PENDING    Antibody Screen PENDING    Sample Expiration 01/08/2019,2359    Unit Number [id number]    Blood Component Type RED CELLS,LR    Unit division 00    Status of Unit ISSUED    Unit tag comment VERBAL ORDERS PER DR Lajean Saver    Transfusion Status      OK TO TRANSFUSE Performed at [facility], [address] [place], [place] [address]    Crossmatch Result PENDING   BPAM [facility]  Result Value  Ref Range   ISSUE DATE / TIME [id number]    Blood Product Unit Number [id number]    PRODUCT CODE [id number]    Unit Type and Rh 9500    Blood Product Expiration Date [id number]    Ct Head Wo Contrast  Result Date: 01/05/2019 CLINICAL DATA:  Altered level of consciousness. EXAM: CT HEAD WITHOUT CONTRAST TECHNIQUE: Contiguous axial images were obtained from the base of the skull through the vertex without intravenous contrast. COMPARISON:  CT head 10/18/2009 FINDINGS: Brain: Moderate atrophy with mild progression. Chronic microvascular ischemic changes in the white matter. Small chronic infarct bilateral parietal lobe unchanged. No acute infarct, hemorrhage, mass. Vascular: Negative for hyperdense vessel. Atherosclerotic calcification in the cavernous carotid bilaterally. Skull: Negative Sinuses/Orbits: Negative Other: None IMPRESSION: Moderate atrophy and chronic ischemic changes. No acute abnormality. Electronically Signed   By: Franchot Gallo M.D.   On: 01/05/2019 13:13   Dg Chest Port 1 View  Result Date: 01/05/2019 CLINICAL DATA:  Shortness of breath and difficulty speaking EXAM: PORTABLE CHEST 1 VIEW COMPARISON:  09/02/2017 FINDINGS: Cardiac shadow is mildly prominent but accentuated by the portable technique. Postsurgical changes are seen. Patchy infiltrative changes are noted in both lungs. Small left effusion is noted as well. No acute bony abnormality is noted. IMPRESSION: Patchy infiltrates worst in the left base with small effusion. Electronically Signed   By: Inez Catalina M.D.   On: 01/05/2019 13:14    EKG EKG Interpretation  Date/Time:  Friday Jan 05 2019 10:22:35 EDT Ventricular Rate:  61 PR Interval:    QRS Duration: 106 QT Interval:  468 QTC Calculation: 472 R Axis:   56 Text Interpretation:  Atrial fibrillation Nonspecific T wave abnormality Confirmed by Lajean Saver [phone]) on 01/05/2019 10:38:10 AM   Radiology Ct Head Wo Contrast  Result Date: 01/05/2019  CLINICAL DATA:  Altered level of consciousness. EXAM: CT HEAD WITHOUT CONTRAST TECHNIQUE: Contiguous axial images were obtained from the base of the skull through the vertex without intravenous contrast. COMPARISON:  CT head 10/18/2009 FINDINGS: Brain: Moderate atrophy with mild progression. Chronic microvascular ischemic changes in the white matter. Small chronic infarct bilateral parietal lobe unchanged. No acute infarct, hemorrhage, mass. Vascular: Negative for hyperdense vessel. Atherosclerotic calcification in the cavernous carotid bilaterally. Skull: Negative Sinuses/Orbits: Negative Other: None IMPRESSION: Moderate atrophy and chronic ischemic changes. No acute abnormality. Electronically Signed   By: Franchot Gallo M.D.   On: 01/05/2019 13:13   Dg Chest Port 1 View  Result Date: 01/05/2019 CLINICAL DATA:  Shortness of breath and difficulty speaking EXAM: PORTABLE CHEST 1 VIEW COMPARISON:  09/02/2017 FINDINGS: Cardiac shadow is mildly prominent but accentuated by the portable technique. Postsurgical changes are seen. Patchy infiltrative changes are noted in both lungs. Small left effusion is noted as well. No acute bony abnormality is noted. IMPRESSION: Patchy infiltrates worst in the left base with small effusion. Electronically Signed   By: Elta Guadeloupe  Lukens M.D.   On: 01/05/2019 13:14    Procedures Procedures (including critical care time)  Medications Ordered in ED Medications - No data to display   Initial Impression / Assessment and Plan / ED Course  I have reviewed the triage vital signs and the nursing notes.  Pertinent labs & imaging results that were available during my care of the patient were reviewed by me and considered in my medical decision making (see chart for details).  Iv ns. Continuous pulse ox and monitor. Ecg. Stat labs and imaging ordered.  Reviewed nursing notes and prior charts for additional history.   Labs reviewed by me - wbc normal. Chem normal. hgb very low 6  - patient with recent outpatient hemoccult test that was positive.   Hemoccult test in ED - stool light brown, neg.   Transfuse prbc.  Await imaging results.   Recheck abd soft nt.   Pt afebrile.   covid test negative.   ARRAN FESSEL was evaluated in Emergency Department on 01/05/2019 for the symptoms described in the history of present illness. He was evaluated in the context of the global COVID-19 pandemic, which necessitated consideration that the patient might be at risk for infection with the SARS-CoV-2 virus that causes COVID-19. Institutional protocols and algorithms that pertain to the evaluation of patients at risk for COVID-19 are in a state of rapid change based on information released by regulatory bodies including the CDC and federal and state organizations. These policies and algorithms were followed during the patient's care in the ED.  ?Whether infiltrate on xr due to failure related to significant anemia. bnp is elevated.   CT reviewed by me - no acute hem.  CXR reviewed by me - +infiltrate/effusion noted.   Patient agreeable to transport/admit to [facility] system.  CRITICAL CARE RE: severe anemia/symptomatic anemia requiring emergent transfusion, dyspnea,  Performed by: Mirna Mires Total critical care time: 45 minutes Critical care time was exclusive of separately billable procedures and treating other patients. Critical care was necessary to treat or prevent imminent or life-threatening deterioration. Critical care was time spent personally by me on the following activities: development of treatment plan with patient and/or surrogate as well as nursing, discussions with consultants, evaluation of patient's response to treatment, examination of patient, obtaining history from patient or surrogate, ordering and performing treatments and interventions, ordering and review of laboratory studies, ordering and review of radiographic studies, pulse oximetry and  re-evaluation of patient's condition.   Final Clinical Impressions(s) / ED Diagnoses   Final diagnoses:  None    ED Discharge Orders    None       Lajean Saver, MD 01/05/19 1331

## 2019-01-05 NOTE — ED Notes (Signed)
Have been attempting to prepare for blood transfusion but when scanning the unit Epic gave error that the unit number did not match.  Two nurses verified the blood bag and sheet and it did match. Blood was not spiked and was returned to the lab.  They are checking it at this time.

## 2019-01-05 NOTE — ED Notes (Signed)
ED Provider at bedside. 

## 2019-01-06 LAB — CBC
HCT: 24 % — ABNORMAL LOW (ref 39.0–52.0)
Hemoglobin: 7.2 g/dL — ABNORMAL LOW (ref 13.0–17.0)
MCH: 29.3 pg (ref 26.0–34.0)
MCHC: 30 g/dL (ref 30.0–36.0)
MCV: 97.6 fL (ref 80.0–100.0)
Platelets: 223 10*3/uL (ref 150–400)
RBC: 2.46 MIL/uL — ABNORMAL LOW (ref 4.22–5.81)
RDW: 18 % — ABNORMAL HIGH (ref 11.5–15.5)
WBC: 5.7 10*3/uL (ref 4.0–10.5)
nRBC: 0 % (ref 0.0–0.2)

## 2019-01-06 LAB — BPAM RBC
Blood Product Expiration Date: 202006062359
Unit Type and Rh: 5100

## 2019-01-06 LAB — COMPREHENSIVE METABOLIC PANEL
ALT: 14 U/L (ref 0–44)
AST: 17 U/L (ref 15–41)
Albumin: 2.4 g/dL — ABNORMAL LOW (ref 3.5–5.0)
Alkaline Phosphatase: 111 U/L (ref 38–126)
Anion gap: 8 (ref 5–15)
BUN: 18 mg/dL (ref 8–23)
CO2: 20 mmol/L — ABNORMAL LOW (ref 22–32)
Calcium: 7.8 mg/dL — ABNORMAL LOW (ref 8.9–10.3)
Chloride: 109 mmol/L (ref 98–111)
Creatinine, Ser: 0.95 mg/dL (ref 0.61–1.24)
GFR calc Af Amer: >60 mL/min (ref >60)
GFR calc non Af Amer: >60 mL/min (ref >60)
Glucose, Bld: 76 mg/dL (ref 70–99)
Potassium: 4.1 mmol/L (ref 3.5–5.1)
Sodium: 137 mmol/L (ref 135–145)
Total Bilirubin: 0.5 mg/dL (ref 0.3–1.2)
Total Protein: 5.9 g/dL — ABNORMAL LOW (ref 6.5–8.1)

## 2019-01-06 LAB — ABO/RH: ABO/RH(D): O NEG

## 2019-01-06 LAB — PROTIME-INR
INR: 2.3 — ABNORMAL HIGH (ref 0.8–1.2)
Prothrombin Time: 24.6 seconds — ABNORMAL HIGH (ref 11.4–15.2)

## 2019-01-06 LAB — PREPARE RBC (CROSSMATCH)

## 2019-01-06 MED ORDER — CHLORHEXIDINE GLUCONATE CLOTH 2 % EX PADS
6.0000 | MEDICATED_PAD | Freq: Every day | CUTANEOUS | Status: DC
  Administered 2019-01-06 – 2019-01-08 (×3): 6 via TOPICAL

## 2019-01-06 MED ORDER — PNEUMOCOCCAL VAC POLYVALENT 25 MCG/0.5ML IJ INJ
0.5000 mL | INJECTION | INTRAMUSCULAR | Status: AC
  Administered 2019-01-07: 0.5 mL via INTRAMUSCULAR
  Filled 2019-01-06: qty 0.5

## 2019-01-06 MED ORDER — PANTOPRAZOLE SODIUM 40 MG IV SOLR
40.0000 mg | Freq: Two times a day (BID) | INTRAVENOUS | Status: DC
  Administered 2019-01-06 – 2019-01-08 (×5): 40 mg via INTRAVENOUS
  Filled 2019-01-06 (×4): qty 40

## 2019-01-06 NOTE — Evaluation (Signed)
Clinical/Bedside Swallow Evaluation Patient Details  Name: Gary Valencia MRN: [id number] Date of Birth: [birth year]  Today's Date: 01/06/2019 Time: SLP Start Time (ACUTE ONLY): 1555 SLP Stop Time (ACUTE ONLY): 1630 SLP Time Calculation (min) (ACUTE ONLY): 35 min  Past Medical History:  Past Medical History:  Diagnosis Date  . AAA (abdominal aortic aneurysm) ([place])   . Actinic keratoses   . Anxiety   . ASHD (arteriosclerotic heart disease) 1984  . Atrial fibrillation ([place])   . Cancer ([place]) 1999   PROSTATE  . CVA (cerebral vascular accident) ([place])   . Diverticulosis   . Dyslipidemia   . Dysrhythmia   . Personal history of colonic polyps    Past Surgical History:  Past Surgical History:  Procedure Laterality Date  . ABDOMINAL AORTIC ENDOVASCULAR STENT GRAFT N/A 12/11/2015   Procedure: ABDOMINAL AORTIC ENDOVASCULAR STENT GRAFT;  Surgeon: Serafina Mitchell, MD;  Location: [place];  Service: Vascular;  Laterality: N/A;  . CORONARY ARTERY BYPASS GRAFT  1984   [facility]- Dr. Redmond Pulling x4  . EYE SURGERY Bilateral    cataracts  . ODONTOID FRACTURE SURGERY     Odontoid screw fixation of type 2 odontoid fracture, open reduction and internal fixation of the fracture--patient had fallen down the stairs at 85 years old  . OTHER SURGICAL HISTORY  01/29/1998   Stage TIC adenocarcinoma of the prostate--Surgeon--David, Grapey M.D.--Interstital seed implantation with I-125 seeds--PSA of approximately 7  . PROSTATE SURGERY  approx 2003   [place] -Grapey  . TONSILLECTOMY     HPI:  Patient is a 85 y.o. male with PMH: recent CVA with residual dysarthria, prostate cancer, atrial fibrilation on anticoagulation with Coumadin, dyslipidemia, CAD s/p CABG, who was brought to ER with generalized weakness, several days of black-colored stool. CXR revealed left lower lobe infiltrate with small pleural effusion; GI was consulted and he was started on PPI and clear liquids. Patient was observed to cough with clear  liquids and was made NPO awaiting swallow evaluation.   Assessment / Plan / Recommendation Clinical Impression  Patient presents with a mild-moderate pharyngeal dysphagia with likely component of esophageal dyspahgia as well. Patient did not exhbiit any overt s/s of aspiration or penetration with puree solids or nectar thick liquids, but did exhibit cough and throat clearing with thin liquids via straw sips and cup sips, with no benefit from head turn left, right or chin tuck posture. Patient's voice is hoarse and SLP questions vocal cord dysfunction versus edema. Patient would benefit from MBS to objectively assess swallow function prior to advancement of liquids consistency. SLP Visit Diagnosis: Dysphagia, unspecified (R13.10)    Aspiration Risk  Mild aspiration risk;Moderate aspiration risk    Diet Recommendation Nectar-thick liquid   Liquid Administration via: Cup;Straw Medication Administration: Crushed with puree Supervision: Patient able to self feed Compensations: Minimize environmental distractions;Slow rate;Small sips/bites Postural Changes: Seated upright at 90 degrees;Remain upright for at least 30 minutes after po intake    Other  Recommendations Oral Care Recommendations: Oral care BID Other Recommendations: Order thickener from pharmacy;Prohibited food (jello, ice cream, thin soups);Place PMSV during PO intake;Remove water pitcher;Clarify dietary restrictions   Follow up Recommendations Other (comment);Home health SLP(TBD)      Frequency and Duration min 2x/week  1 week       Prognosis Prognosis for Safe Diet Advancement: Good      Swallow Study   General Date of Onset: 01/05/19 HPI: Patient is a 85 y.o. male with PMH: recent CVA with residual dysarthria,  prostate cancer, atrial fibrilation on anticoagulation with Coumadin, dyslipidemia, CAD s/p CABG, who was brought to ER with generalized weakness, several days of black-colored stool. CXR revealed left lower lobe  infiltrate with small pleural effusion; GI was consulted and he was started on PPI and clear liquids. Patient was observed to cough with clear liquids and was made NPO awaiting swallow evaluation. Type of Study: Bedside Swallow Evaluation Previous Swallow Assessment: N/A Diet Prior to this Study: NPO Temperature Spikes Noted: No Respiratory Status: Nasal cannula History of Recent Intubation: No Behavior/Cognition: Alert;Cooperative;Pleasant mood Oral Cavity Assessment: Within Functional Limits Oral Care Completed by SLP: Yes Oral Cavity - Dentition: Adequate natural dentition Self-Feeding Abilities: Able to feed self Patient Positioning: Upright in bed Baseline Vocal Quality: Hoarse;Other (comment)(questionable vocal cord disorder vs edema) Volitional Cough: Strong Volitional Swallow: Able to elicit    Oral/Motor/Sensory Function Overall Oral Motor/Sensory Function: Within functional limits   Ice Chips     Thin Liquid Thin Liquid: Impaired Presentation: Cup;Straw Pharyngeal  Phase Impairments: Throat Clearing - Immediate;Throat Clearing - Delayed;Cough - Delayed;Cough - Immediate Other Comments: Patient stated that it felt that liquids were travelling better on left side. He did not notice any difference with head turn left, right or chin tuck postures.    Nectar Thick Nectar Thick Liquid: Within functional limits Presentation: Cup;Self Fed Other Comments: Patient reported that nectar thick liquids felt like they were transiting better than thins. No overt s/s of aspiration or penetration with nectar thick liquids.    Honey Thick     Puree Puree: Within functional limits   Solid     Solid: Not tested Other Comments: GI is recommending clear liquids at this time      Dannial Monarch 01/06/2019,5:33 PM   Sonia Baller, MA, [facility] Acute Rehab Pager: [phone]

## 2019-01-06 NOTE — Consult Note (Signed)
Referring Provider: Dr. Darrick Meigs Primary Care Physician:  [name], MD Primary Gastroenterologist:  Althia Forts  Reason for Consultation:  GI bleed; Anemia  HPI: Gary Valencia is a 85 y.o. male with multiple medical problems as stated below and on chronic Coumadin for Afib who had 2 days of black stools early this week that resolved mid-week. Denies associated abdominal pain, nausea, vomiting, hematochezia, hematemesis, or dizziness. Denies NSAIDs or alcohol. Denies heartburn. Denies Pepto-Bismol or iron pills. He thinks he had stomach ulcers years ago. Last colonoscopy in 2008 but records not found and he does not recall results. Hgb 6 (8.9 on 11/27/18). S/P 1 U PRBCs and Hgb 7.5 last night and now 7.2 today. Heme NEGATIVE on admit. INR 2.6 yesterday. No BMs since admit.  Past Medical History:  Diagnosis Date  . AAA (abdominal aortic aneurysm) ([place])   . Actinic keratoses   . Anxiety   . ASHD (arteriosclerotic heart disease) 1984  . Atrial fibrillation ([place])   . Cancer ([place]) 1999   PROSTATE  . CVA (cerebral vascular accident) ([place])   . Diverticulosis   . Dyslipidemia   . Dysrhythmia   . Personal history of colonic polyps     Past Surgical History:  Procedure Laterality Date  . ABDOMINAL AORTIC ENDOVASCULAR STENT GRAFT N/A 12/11/2015   Procedure: ABDOMINAL AORTIC ENDOVASCULAR STENT GRAFT;  Surgeon: Serafina Mitchell, MD;  Location: [facility];  Service: Vascular;  Laterality: N/A;  . CORONARY ARTERY BYPASS GRAFT  1984   [facility]- Dr. Redmond Pulling x4  . EYE SURGERY Bilateral    cataracts  . ODONTOID FRACTURE SURGERY     Odontoid screw fixation of type 2 odontoid fracture, open reduction and internal fixation of the fracture--patient had fallen down the stairs at 85 years old  . OTHER SURGICAL HISTORY  01/29/1998   Stage TIC adenocarcinoma of the prostate--Surgeon--David, Grapey M.D.--Interstital seed implantation with I-125 seeds--PSA of approximately 7  . PROSTATE SURGERY  approx 2003   [place]  long -Grapey  . TONSILLECTOMY      Prior to Admission medications   Medication Sig Start Date End Date Taking? Authorizing Provider  metoprolol tartrate (LOPRESSOR) 25 MG tablet Take 0.5 tablets (12.5 mg total) by mouth 2 (two) times daily. 01/02/19  Yes [name], [name], MD  rosuvastatin (CRESTOR) 10 MG tablet TAKE 1 TABLET DAILY Patient taking differently: Take 10 mg by mouth every other day.  12/19/18  Yes [name], [name], MD  warfarin (COUMADIN) 2.5 MG tablet Take 1 tablet (2.5 mg total) by mouth daily. Patient taking differently: Take 1.25-2.5 mg by mouth daily. Take 2.5 mg by mouth on Tuesday, Wednesday, Friday, Saturday and Sunday. Take 1.25mg  by mouth on Monday and Thursday. 12/19/18  Yes [name], [name], MD  amoxicillin-clavulanate (AUGMENTIN) 875-125 MG tablet Take 1 tablet by mouth 2 (two) times daily. Patient not taking: Reported on 10/02/2018 06/23/18   [name], MD    Scheduled Meds: . Chlorhexidine Gluconate Cloth  6 each Topical Daily  . mouth rinse  15 mL Mouth Rinse BID  . metoprolol tartrate  12.5 mg Oral BID  . [START ON 01/07/2019] pneumococcal 23 valent vaccine  0.5 mL Intramuscular Tomorrow-1000  . rosuvastatin  10 mg Oral QODAY   Continuous Infusions: . sodium chloride    . sodium chloride Stopped (01/06/19 0205)  . azithromycin Stopped (01/05/19 2300)  . cefTRIAXone (ROCEPHIN)  IV Stopped (01/05/19 2045)   PRN Meds:.acetaminophen **OR** acetaminophen, ondansetron **OR** ondansetron (ZOFRAN) IV  Allergies as of 01/05/2019 -  Review Complete 01/05/2019  Allergen Reaction Noted  . Asa buff (mag [buffered aspirin] Nausea Only 06/23/2011  . Zetia [ezetimibe] Other (See Comments) 06/23/2011    Family History  Problem Relation Age of Onset  . Arthritis Mother   . Heart disease Father   . AAA (abdominal aortic aneurysm) Brother     Social History   Socioeconomic History  . Marital status: Married    Spouse name: Not on file  . Number of children: Not on  file  . Years of education: Not on file  . Highest education level: Not on file  Occupational History  . Not on file  Social Needs  . Financial resource strain: Not on file  . Food insecurity:    Worry: Not on file    Inability: Not on file  . Transportation needs:    Medical: Not on file    Non-medical: Not on file  Tobacco Use  . Smoking status: Never Smoker  . Smokeless tobacco: Never Used  Substance and Sexual Activity  . Alcohol use: No  . Drug use: No  . Sexual activity: Not on file  Lifestyle  . Physical activity:    Days per week: Not on file    Minutes per session: Not on file  . Stress: Not on file  Relationships  . Social connections:    Talks on phone: Not on file    Gets together: Not on file    Attends religious service: Not on file    Active member of club or organization: Not on file    Attends meetings of clubs or organizations: Not on file    Relationship status: Not on file  . Intimate partner violence:    Fear of current or ex partner: Not on file    Emotionally abused: Not on file    Physically abused: Not on file    Forced sexual activity: Not on file  Other Topics Concern  . Not on file  Social History Narrative  . Not on file    Review of Systems: All negative except as stated above in HPI.  Physical Exam: Vital signs: Vitals:   01/06/19 0800 01/06/19 0846  BP:  132/65  Pulse:  62  Resp:    Temp: 97.6 F (36.4 C)   SpO2:    R 14, O2 sat 94%   General:   Lethargic, elderly, thin, no acute distress, pleasant Head: normocephalic, atraumatic Eyes: anicteric sclera ENT: oropharynx clear Neck: supple, nontender Lungs:  Clear throughout to auscultation.   No wheezes, crackles, or rhonchi. No acute distress. Heart:  Regular rate and rhythm; no murmurs, clicks, rubs,  or gallops. Abdomen: soft, nontender, nondistended, +BS  Rectal:  Deferred Ext: no edema  GI:  Lab Results: Recent Labs    01/05/19 1045 01/05/19 1911  01/06/19 0248  WBC 7.0 6.5 5.7  HGB 6.0* 7.5* 7.2*  HCT 21.1* 25.7* 24.0*  PLT 271 263 223   BMET Recent Labs    01/05/19 1045 01/06/19 0248  NA 136 137  K 4.3 4.1  CL 107 109  CO2 22 20*  GLUCOSE 128* 76  BUN 22 18  CREATININE 1.11 0.95  CALCIUM 8.0* 7.8*   LFT Recent Labs    01/06/19 0248  PROT 5.9*  ALBUMIN 2.4*  AST 17  ALT 14  ALKPHOS 111  BILITOT 0.5   PT/INR Recent Labs    01/05/19 1045  LABPROT 27.6*  INR 2.6*     Studies/Results: Ct Head  Wo Contrast  Result Date: 01/05/2019 CLINICAL DATA:  Altered level of consciousness. EXAM: CT HEAD WITHOUT CONTRAST TECHNIQUE: Contiguous axial images were obtained from the base of the skull through the vertex without intravenous contrast. COMPARISON:  CT head 10/18/2009 FINDINGS: Brain: Moderate atrophy with mild progression. Chronic microvascular ischemic changes in the white matter. Small chronic infarct bilateral parietal lobe unchanged. No acute infarct, hemorrhage, mass. Vascular: Negative for hyperdense vessel. Atherosclerotic calcification in the cavernous carotid bilaterally. Skull: Negative Sinuses/Orbits: Negative Other: None IMPRESSION: Moderate atrophy and chronic ischemic changes. No acute abnormality. Electronically Signed   By: Franchot Gallo M.D.   On: 01/05/2019 13:13   Dg Chest Port 1 View  Result Date: 01/05/2019 CLINICAL DATA:  Shortness of breath and difficulty speaking EXAM: PORTABLE CHEST 1 VIEW COMPARISON:  09/02/2017 FINDINGS: Cardiac shadow is mildly prominent but accentuated by the portable technique. Postsurgical changes are seen. Patchy infiltrative changes are noted in both lungs. Small left effusion is noted as well. No acute bony abnormality is noted. IMPRESSION: Patchy infiltrates worst in the left base with small effusion. Electronically Signed   By: Inez Catalina M.D.   On: 01/05/2019 13:14    Impression/Plan: GI bleed - melena early last week that clinically has resolved. Question  peptic ulcer disease vs AVMs. Heme NEG on admit but Hgb 6 down from 8.9 a month ago. No signs of ongoing bleeding and would manage conservatively. Will hold off on EGD unless melena recurs or anemia worsens. Start IV PPI. Clear liquid diet. Follow H/H. Recheck INR. Will follow.    LOS: 1 day   [name]  01/06/2019, 10:37 AM  Questions please call [phone]

## 2019-01-06 NOTE — Progress Notes (Addendum)
[facility]  PROGRESS NOTE  Gary Valencia CZY:[id number] DOB: Jul 26, 1923 DOA: 01/05/2019 PCP: [name], MD   Brief HPI:   85 year old male with history of recent CVA with residual dysarthria, prostate cancer, atrial fibrillation on anticoagulation with Coumadin, dyslipidemia, CAD status post CABG was brought to hospital with generalized weakness.  Patient had been having black-colored stool for past few days.  FOBT was negative in the ED.  Chest x-ray showed left lower lobe infiltrate with small pleural effusion.  Patient started on IV ceftriaxone and Zithromax.  Hemoglobin was 6.0 and transfused 1 unit PRBC.  GI was consulted.    Subjective   Patient appears in no acute distress, denies abdominal pain.  No nausea or vomiting.  Appreciate GI consultation   Assessment/Plan:     1. Melena-FOBT is negative in the ED.  No urgent need for EGD as per GI.  Started on IV PPI.  Will follow H&H.  Started on clear liquid diet.  2. Anemia-patient presented with hemoglobin of 6.0, it improved to 7.2 after 1 unit PRBC.  Hemoglobin has been stable.  This is secondary to above.  3. Community-acquired pneumonia-chest x-ray showed patchy infiltrate in the left base with small effusion.  Started on ceftriaxone and Zithromax.  SARS-CoV-2 is negative  4. Dysphagia-we will keep patient n.p.o. till we get swallow evaluation.  Swallow evaluation has been ordered.  5. Atrial fibrillation-chronic, persistent.  Heart rate is controlled with Coreg.  Anticoagulation is on hold due to GI bleed.  6. History of CVA-we will hold both aspirin and Coumadin at this time.  Will discuss with GI regarding restarting at least aspirin  7. History of CAD status post CABG-stable, aspirin on hold as above.      CBG: Recent Labs  Lab 01/05/19 1026  GLUCAP 132*    CBC: Recent Labs  Lab 01/05/19 1045 01/05/19 1911 01/06/19 0248  WBC 7.0 6.5 5.7  HGB 6.0* 7.5* 7.2*  HCT 21.1* 25.7* 24.0*  MCV  98.1 97.0 97.6  PLT 271 263 093    Basic Metabolic Panel: Recent Labs  Lab 01/05/19 1045 01/06/19 0248  NA 136 137  K 4.3 4.1  CL 107 109  CO2 22 20*  GLUCOSE 128* 76  BUN 22 18  CREATININE 1.11 0.95  CALCIUM 8.0* 7.8*     DVT prophylaxis: SCDs  Code Status: Partial code, CPR, no intubation and mechanical ventilation  Family Communication: No family at bedside  Disposition Plan: likely home when medically ready for discharge     Consultants:  Gastroenterology  Procedures:  None   Antibiotics:   Anti-infectives (From admission, onward)   Start     Dose/Rate Route Frequency Ordered Stop   01/05/19 2100  azithromycin (ZITHROMAX) 500 mg in sodium chloride 0.9 % 250 mL IVPB     500 mg 250 mL/hr over 60 Minutes Intravenous Every 24 hours 01/05/19 1920     01/05/19 2000  cefTRIAXone (ROCEPHIN) 1 g in sodium chloride 0.9 % 100 mL IVPB     1 g 200 mL/hr over 30 Minutes Intravenous Every 24 hours 01/05/19 1920         Objective   Vitals:   01/06/19 0846 01/06/19 1100 01/06/19 1200 01/06/19 1300  BP: 132/65 (!) 127/56 (!) 84/41 (!) 124/56  Pulse: 62 60 (!) 51 61  Resp:  14 13 16   Temp:   (!) 97.3 F (36.3 C)   TempSrc:   Axillary   SpO2:  100% 97% 100%  Weight:      Height:        Intake/Output Summary (Last 24 hours) at 01/06/2019 1329 Last data filed at 01/06/2019 0400 Gross per 24 hour  Intake 701.17 ml  Output 350 ml  Net 351.17 ml   Filed Weights   01/06/19 0320  Weight: 65.8 kg     Physical Examination:   General-appears in no acute distress  Heart-S1-S2, regular, no murmur auscultated  Lungs-bilateral rhonchi auscultated  Abdomen-soft, nontender, no organomegaly  Extremities-no edema in the lower extremities  Neuro-alert, oriented x3, no focal deficit noted     Data Reviewed: I have personally reviewed following labs and imaging studies   Recent Results (from the past 240 hour(s))  SARS Coronavirus 2 (Hosp  order,Performed in [facility] lab via [facility])     Status: None   Collection Time: 01/05/19 10:45 AM  Result Value Ref Range Status   SARS Coronavirus 2 ([facility] Now) NEGATIVE NEGATIVE Final    Comment: (NOTE) Interpretive Result Comment(s): COVID 19 Positive SARS CoV 2 target nucleic acids are DETECTED. The SARS CoV 2 RNA is generally detectable in upper and lower respiratory specimens during the acute phase of infection.  Positive results are indicative of active infection with SARS CoV 2.  Clinical correlation with patient history and other diagnostic information is necessary to determine patient infection status.  Positive results do not rule out bacterial infection or coinfection with other viruses. The expected result is Negative. COVID 19 Negative SARS CoV 2 target nucleic acids are NOT DETECTED. The SARS CoV 2 RNA is generally detectable in upper and lower respiratory specimens during the acute phase of infection.  Negative results do not preclude SARS CoV 2 infection, do not rule out coinfections with other pathogens, and should not be used as the sole basis for treatment or other patient management decisions.  Negative results must be combined with clinical  observations, patient history, and epidemiological information. The expected result is Negative. Invalid Presence or absence of SARS CoV 2 nucleic acids cannot be determined. Repeat testing was performed on the submitted specimen and repeated Invalid results were obtained.  If clinically indicated, additional testing on a new specimen with an alternate test methodology [phone]) is advised.  The SARS CoV 2 RNA is generally detectable in upper and lower respiratory specimens during the acute phase of infection. The expected result is Negative. Fact Sheet for Patients:  GolfingFamily.no Fact Sheet for Healthcare Providers: https://www.hernandez-brewer.com/ This test is not yet  approved or cleared by the [place] FDA and has been authorized for detection and/or diagnosis of SARS CoV 2 by FDA under an Emergency Use Authorization (EUA).  This EUA will remain in effect (meaning this test can be used) for the duration of the COVID19 d eclaration under Section 564(b)(1) of the Act, 21 U.S.C. section [phone] 3(b)(1), unless the authorization is terminated or revoked sooner. Performed at [facility], [address] [place], [place] [address]   MRSA PCR Screening     Status: None   Collection Time: 01/05/19  5:43 PM  Result Value Ref Range Status   MRSA by PCR NEGATIVE NEGATIVE Final    Comment:        The GeneXpert MRSA Assay (FDA approved for NASAL specimens only), is one component of a comprehensive MRSA colonization surveillance program. It is not intended to diagnose MRSA infection nor to guide or monitor treatment for MRSA infections. Performed at [facility], [place]  [address], [place], [place] [address]      Liver Function Tests: Recent Labs  Lab 01/05/19 1045 01/06/19 0248  AST 21 17  ALT 13 14  ALKPHOS 127* 111  BILITOT 0.4 0.5  PROT 6.9 5.9*  ALBUMIN 2.6* 2.4*   No results for input(s): LIPASE, AMYLASE in the last 168 hours. No results for input(s): AMMONIA in the last 168 hours.  Cardiac Enzymes: Recent Labs  Lab 01/05/19 1045  TROPONINI 0.03*   BNP (last 3 results) Recent Labs    01/05/19 1045  BNP 563.8*    ProBNP (last 3 results) No results for input(s): PROBNP in the last 8760 hours.    Studies: Ct Head Wo Contrast  Result Date: 01/05/2019 CLINICAL DATA:  Altered level of consciousness. EXAM: CT HEAD WITHOUT CONTRAST TECHNIQUE: Contiguous axial images were obtained from the base of the skull through the vertex without intravenous contrast. COMPARISON:  CT head 10/18/2009 FINDINGS: Brain: Moderate atrophy with mild progression. Chronic microvascular ischemic changes in the white  matter. Small chronic infarct bilateral parietal lobe unchanged. No acute infarct, hemorrhage, mass. Vascular: Negative for hyperdense vessel. Atherosclerotic calcification in the cavernous carotid bilaterally. Skull: Negative Sinuses/Orbits: Negative Other: None IMPRESSION: Moderate atrophy and chronic ischemic changes. No acute abnormality. Electronically Signed   By: Franchot Gallo M.D.   On: 01/05/2019 13:13   Dg Chest Port 1 View  Result Date: 01/05/2019 CLINICAL DATA:  Shortness of breath and difficulty speaking EXAM: PORTABLE CHEST 1 VIEW COMPARISON:  09/02/2017 FINDINGS: Cardiac shadow is mildly prominent but accentuated by the portable technique. Postsurgical changes are seen. Patchy infiltrative changes are noted in both lungs. Small left effusion is noted as well. No acute bony abnormality is noted. IMPRESSION: Patchy infiltrates worst in the left base with small effusion. Electronically Signed   By: Inez Catalina M.D.   On: 01/05/2019 13:14    Scheduled Meds: . Chlorhexidine Gluconate Cloth  6 each Topical Daily  . mouth rinse  15 mL Mouth Rinse BID  . metoprolol tartrate  12.5 mg Oral BID  . pantoprazole (PROTONIX) IV  40 mg Intravenous Q12H  . [START ON 01/07/2019] pneumococcal 23 valent vaccine  0.5 mL Intramuscular Tomorrow-1000  . rosuvastatin  10 mg Oral QODAY    Admission status: Inpatient: Based on patients clinical presentation and evaluation of above clinical data, I have made determination that patient meets Inpatient criteria at this time.  Time spent: 20 minutes  [facility] Pager [phone]. If 7PM-7AM, please contact night-coverage at [email or web address], Office  [phone]  password [email or web address]  01/06/2019, 1:29 PM  LOS: 1 day

## 2019-01-07 DIAGNOSIS — J181 Lobar pneumonia, unspecified organism: Secondary | ICD-10-CM

## 2019-01-07 LAB — COMPREHENSIVE METABOLIC PANEL
ALT: 12 U/L (ref 0–44)
AST: 17 U/L (ref 15–41)
Albumin: 2.3 g/dL — ABNORMAL LOW (ref 3.5–5.0)
Alkaline Phosphatase: 114 U/L (ref 38–126)
Anion gap: 10 (ref 5–15)
BUN: 17 mg/dL (ref 8–23)
CO2: 23 mmol/L (ref 22–32)
Calcium: 8.2 mg/dL — ABNORMAL LOW (ref 8.9–10.3)
Chloride: 106 mmol/L (ref 98–111)
Creatinine, Ser: 0.95 mg/dL (ref 0.61–1.24)
GFR calc Af Amer: >60 mL/min (ref >60)
GFR calc non Af Amer: >60 mL/min (ref >60)
Glucose, Bld: 63 mg/dL — ABNORMAL LOW (ref 70–99)
Potassium: 4.3 mmol/L (ref 3.5–5.1)
Sodium: 139 mmol/L (ref 135–145)
Total Bilirubin: 0.6 mg/dL (ref 0.3–1.2)
Total Protein: 6.2 g/dL — ABNORMAL LOW (ref 6.5–8.1)

## 2019-01-07 LAB — CBC
HCT: 25 % — ABNORMAL LOW (ref 39.0–52.0)
Hemoglobin: 7.4 g/dL — ABNORMAL LOW (ref 13.0–17.0)
MCH: 28.8 pg (ref 26.0–34.0)
MCHC: 29.6 g/dL — ABNORMAL LOW (ref 30.0–36.0)
MCV: 97.3 fL (ref 80.0–100.0)
Platelets: 231 10*3/uL (ref 150–400)
RBC: 2.57 MIL/uL — ABNORMAL LOW (ref 4.22–5.81)
RDW: 17.8 % — ABNORMAL HIGH (ref 11.5–15.5)
WBC: 6.3 10*3/uL (ref 4.0–10.5)
nRBC: 0 % (ref 0.0–0.2)

## 2019-01-07 LAB — GLUCOSE, CAPILLARY
Glucose-Capillary: 69 mg/dL — ABNORMAL LOW (ref 70–99)
Glucose-Capillary: 92 mg/dL (ref 70–99)
Glucose-Capillary: 76 mg/dL (ref 70–99)

## 2019-01-07 MED ORDER — DEXTROSE-NACL 5-0.45 % IV SOLN
INTRAVENOUS | Status: DC
  Administered 2019-01-07: 09:00:00 via INTRAVENOUS

## 2019-01-07 MED ORDER — RESOURCE THICKENUP CLEAR PO POWD
ORAL | Status: DC | PRN
  Filled 2019-01-07: qty 125

## 2019-01-07 NOTE — Progress Notes (Signed)
Pt's blood sugar was 63 on CMP this am, gave pt some nectar thick juice to drink and will recheck blood sugar.

## 2019-01-07 NOTE — Progress Notes (Signed)
[facility] Progress Note  Gary Valencia 85 y.o. 1923/06/12   Subjective: Feels ok. Denies any BMs since admit. Denies abd pain.  Objective: Vital signs: Vitals:   01/07/19 0800 01/07/19 1136  BP: 131/61 134/68  Pulse: 62 60  Resp: 17 18  Temp: (!) 97.5 F (36.4 C) 97.7 F (36.5 C)  SpO2: 99% 97%    Physical Exam: Gen: lethargic, elderly, thin, no acute distress, pleasant HEENT: anicteric sclera CV: RRR Chest: CTA B Abd: soft, nontender, nondistended, +BS Ext: no edema  Lab Results: Recent Labs    01/06/19 0248 01/07/19 0303  NA 137 139  K 4.1 4.3  CL 109 106  CO2 20* 23  GLUCOSE 76 63*  BUN 18 17  CREATININE 0.95 0.95  CALCIUM 7.8* 8.2*   Recent Labs    01/06/19 0248 01/07/19 0303  AST 17 17  ALT 14 12  ALKPHOS 111 114  BILITOT 0.5 0.6  PROT 5.9* 6.2*  ALBUMIN 2.4* 2.3*   Recent Labs    01/06/19 0248 01/07/19 0303  WBC 5.7 6.3  HGB 7.2* 7.4*  HCT 24.0* 25.0*  MCV 97.6 97.3  PLT 223 231      Assessment/Plan: Symptomatic anemia with melenic stools last week that resolved prior to admit. No BMs since admit and heme negative. Coumadin on hold. Hgb stable since blood transfusion. Would manage conservatively and not plan on doing an EGD unless evidence of bleeding recurs. Advance diet as tolerated. Resume Coumadin tomorrow if H/H stable and no bleeding. Change to PO PPI BID when tolerating solid food. Will sign off. Call us back if bleeding returns, anemia worsens or questions arise.   [name] 01/07/2019, 3:05 PM  Questions please call [phone] ID: Gary Valencia, male   DOB: 1923/04/28, 85 y.o.   MRN: [id number]

## 2019-01-07 NOTE — Progress Notes (Signed)
[facility]  PROGRESS NOTE  Gary Valencia EQA:[id number] DOB: 11-Apr-1923 DOA: 01/05/2019 PCP: [name], MD   Brief HPI:   85 year old male with history of recent CVA with residual dysarthria, prostate cancer, atrial fibrillation on anticoagulation with Coumadin, dyslipidemia, CAD status post CABG was brought to hospital with generalized weakness.  Patient had been having black-colored stool for past few days.  FOBT was negative in the ED.  Chest x-ray showed left lower lobe infiltrate with small pleural effusion.  Patient started on IV ceftriaxone and Zithromax.  Hemoglobin was 6.0 and transfused 1 unit PRBC.  GI was consulted.   Subjective   Patient seen and examined, dramatic improvement in patient's speech.  Mentally alert, communicating.  He denies shortness of breath.  Denies coughing up any phlegm.  Hemoglobin is stable at 7.4 today.  On PPI   Assessment/Plan:     1. Melena-FOBT is negative in the ED.  No urgent need for EGD as per GI.  Started on IV PPI.  Will follow H&H.  Started on nectar thick liquid diet after speech therapy evaluation.  Will need MBS before advancing diet.   2. Anemia-patient presented with hemoglobin of 6.0, it improved to 7.2 after 1 unit PRBC.  Today hemoglobin is stable at 7.4.    3. Community-acquired pneumonia-chest x-ray showed patchy infiltrate in the left base with small effusion. Started on ceftriaxone and Zithromax.  SARS-CoV-2 is negative  4. Dysphagia-swallow evaluation obtained, patient is mild to moderate aspiration risk.  Started on nectar thick liquid diet.  MBS to follow for severity of dysphagia.  Will follow MBS results.    5. Atrial fibrillation-chronic, persistent.  Heart rate is controlled with Metoprolol.  Anticoagulation is on hold due to GI bleed.  6. History of CVA-we will hold both aspirin and Coumadin at this time.  Will discuss with GI regarding restarting at least aspirin  7. History of CAD status post  CABG-stable, aspirin on hold as above.      CBG: Recent Labs  Lab 01/05/19 1026 01/07/19 0614 01/07/19 1025  GLUCAP 132* 69* 92    CBC: Recent Labs  Lab 01/05/19 1045 01/05/19 1911 01/06/19 0248 01/07/19 0303  WBC 7.0 6.5 5.7 6.3  HGB 6.0* 7.5* 7.2* 7.4*  HCT 21.1* 25.7* 24.0* 25.0*  MCV 98.1 97.0 97.6 97.3  PLT 271 263 223 979    Basic Metabolic Panel: Recent Labs  Lab 01/05/19 1045 01/06/19 0248 01/07/19 0303  NA 136 137 139  K 4.3 4.1 4.3  CL 107 109 106  CO2 22 20* 23  GLUCOSE 128* 76 63*  BUN 22 18 17   CREATININE 1.11 0.95 0.95  CALCIUM 8.0* 7.8* 8.2*     DVT prophylaxis: SCDs  Code Status: Partial code, CPR, no intubation and mechanical ventilation  Family Communication: No family at bedside  Disposition Plan: likely home when medically ready for discharge     Consultants:  Gastroenterology  Procedures:  None   Antibiotics:   Anti-infectives (From admission, onward)   Start     Dose/Rate Route Frequency Ordered Stop   01/05/19 2100  azithromycin (ZITHROMAX) 500 mg in sodium chloride 0.9 % 250 mL IVPB     500 mg 250 mL/hr over 60 Minutes Intravenous Every 24 hours 01/05/19 1920     01/05/19 2000  cefTRIAXone (ROCEPHIN) 1 g in sodium chloride 0.9 % 100 mL IVPB     1 g 200 mL/hr over 30 Minutes Intravenous Every 24 hours 01/05/19 1920  Objective   Vitals:   01/07/19 0500 01/07/19 0600 01/07/19 0700 01/07/19 0800  BP: (!) 107/55 (!) 117/45 101/67 131/61  Pulse: (!) 58 (!) 59 60 62  Resp: 16 17 16 17   Temp:    (!) 97.5 F (36.4 C)  TempSrc:    Oral  SpO2: 97% 97% 97% 99%  Weight:      Height:        Intake/Output Summary (Last 24 hours) at 01/07/2019 1058 Last data filed at 01/07/2019 1856 Gross per 24 hour  Intake 466.56 ml  Output 400 ml  Net 66.56 ml   Filed Weights   01/06/19 0320  Weight: 65.8 kg     Physical Examination: General-appears in no acute distress Heart-S1-S2, regular, no murmur  auscultated Lungs-clear to auscultation bilaterally, no wheezing or crackles auscultated Abdomen-soft, nontender, no organomegaly Extremities-no edema in the lower extremities Neuro-alert, oriented x3, no focal deficit noted    Data Reviewed: I have personally reviewed following labs and imaging studies   Recent Results (from the past 240 hour(s))  SARS Coronavirus 2 (Hosp order,Performed in [place] lab via [employer] [facility])     Status: None   Collection Time: 01/05/19 10:45 AM  Result Value Ref Range Status   SARS Coronavirus 2 ([facility] [facility] Now) NEGATIVE NEGATIVE Final    Comment: (NOTE) Interpretive Result Comment(s): COVID 19 Positive SARS CoV 2 target nucleic acids are DETECTED. The SARS CoV 2 RNA is generally detectable in upper and lower respiratory specimens during the acute phase of infection.  Positive results are indicative of active infection with SARS CoV 2.  Clinical correlation with patient history and other diagnostic information is necessary to determine patient infection status.  Positive results do not rule out bacterial infection or coinfection with other viruses. The expected result is Negative. COVID 19 Negative SARS CoV 2 target nucleic acids are NOT DETECTED. The SARS CoV 2 RNA is generally detectable in upper and lower respiratory specimens during the acute phase of infection.  Negative results do not preclude SARS CoV 2 infection, do not rule out coinfections with other pathogens, and should not be used as the sole basis for treatment or other patient management decisions.  Negative results must be combined with clinical  observations, patient history, and epidemiological information. The expected result is Negative. Invalid Presence or absence of SARS CoV 2 nucleic acids cannot be determined. Repeat testing was performed on the submitted specimen and repeated Invalid results were obtained.  If clinically indicated, additional testing on a new specimen  with an alternate test methodology [phone]) is advised.  The SARS CoV 2 RNA is generally detectable in upper and lower respiratory specimens during the acute phase of infection. The expected result is Negative. Fact Sheet for Patients:  G[employer]ingFamily.no Fact Sheet for Healthcare Providers: https://www.hernandez-brewer.com/ This test is not yet approved or cleared by the [place] FDA and has been authorized for detection and/or diagnosis of SARS CoV 2 by FDA under an Emergency Use Authorization (EUA).  This EUA will remain in effect (meaning this test can be used) for the duration of the COVID19 d eclaration under Section 564(b)(1) of the Act, 21 U.S.C. section [phone] 3(b)(1), unless the authorization is terminated or revoked sooner. Performed at [facility], [address] [place], [place] [address]   MRSA PCR Screening     Status: None   Collection Time: 01/05/19  5:43 PM  Result Value Ref Range Status   MRSA by PCR NEGATIVE NEGATIVE  Final    Comment:        The GeneXpert MRSA Assay (FDA approved for NASAL specimens only), is one component of a comprehensive MRSA colonization surveillance program. It is not intended to diagnose MRSA infection nor to guide or monitor treatment for MRSA infections. Performed at [facility], [place] [address] [place], [place] [address]      Liver Function Tests: Recent Labs  Lab 01/05/19 1045 01/06/19 0248 01/07/19 0303  AST 21 17 17   ALT 13 14 12   ALKPHOS 127* 111 114  BILITOT 0.4 0.5 0.6  PROT 6.9 5.9* 6.2*  ALBUMIN 2.6* 2.4* 2.3*   No results for input(s): LIPASE, AMYLASE in the last 168 hours. No results for input(s): AMMONIA in the last 168 hours.  Cardiac Enzymes: Recent Labs  Lab 01/05/19 1045  TROPONINI 0.03*   BNP (last 3 results) Recent Labs    01/05/19 1045  BNP 563.8*    ProBNP (last 3 results) No results for input(s): PROBNP in the last  8760 hours.    Studies: Ct Head Wo Contrast  Result Date: 01/05/2019 CLINICAL DATA:  Altered level of consciousness. EXAM: CT HEAD WITHOUT CONTRAST TECHNIQUE: Contiguous axial images were obtained from the base of the skull through the vertex without intravenous contrast. COMPARISON:  CT head 10/18/2009 FINDINGS: Brain: Moderate atrophy with mild progression. Chronic microvascular ischemic changes in the white matter. Small chronic infarct bilateral parietal lobe unchanged. No acute infarct, hemorrhage, mass. Vascular: Negative for hyperdense vessel. Atherosclerotic calcification in the cavernous carotid bilaterally. Skull: Negative Sinuses/Orbits: Negative Other: None IMPRESSION: Moderate atrophy and chronic ischemic changes. No acute abnormality. Electronically Signed   By: Franchot Gallo M.D.   On: 01/05/2019 13:13   Dg Chest Port 1 View  Result Date: 01/05/2019 CLINICAL DATA:  Shortness of breath and difficulty speaking EXAM: PORTABLE CHEST 1 VIEW COMPARISON:  09/02/2017 FINDINGS: Cardiac shadow is mildly prominent but accentuated by the portable technique. Postsurgical changes are seen. Patchy infiltrative changes are noted in both lungs. Small left effusion is noted as well. No acute bony abnormality is noted. IMPRESSION: Patchy infiltrates worst in the left base with small effusion. Electronically Signed   By: Inez Catalina M.D.   On: 01/05/2019 13:14    Scheduled Meds: . Chlorhexidine Gluconate Cloth  6 each Topical Daily  . mouth rinse  15 mL Mouth Rinse BID  . metoprolol tartrate  12.5 mg Oral BID  . pantoprazole (PROTONIX) IV  40 mg Intravenous Q12H  . rosuvastatin  10 mg Oral QODAY    Admission status: Inpatient: Based on patients clinical presentation and evaluation of above clinical data, I have made determination that patient meets Inpatient criteria at this time.  Time spent: 20 minutes  [facility] Pager [phone]. If 7PM-7AM, please contact  night-coverage at [email or web address], Office  [phone]  password [email or web address]  01/07/2019, 10:58 AM  LOS: 2 days

## 2019-01-08 ENCOUNTER — Inpatient Hospital Stay (HOSPITAL_COMMUNITY): Payer: Medicare Other

## 2019-01-08 DIAGNOSIS — L8990 Pressure ulcer of unspecified site, unspecified stage: Secondary | ICD-10-CM

## 2019-01-08 LAB — GLUCOSE, CAPILLARY
Glucose-Capillary: 83 mg/dL (ref 70–99)
Glucose-Capillary: 102 mg/dL — ABNORMAL HIGH (ref 70–99)

## 2019-01-08 LAB — CBC
HCT: 27.1 % — ABNORMAL LOW (ref 39.0–52.0)
Hemoglobin: 8.1 g/dL — ABNORMAL LOW (ref 13.0–17.0)
MCH: 28.8 pg (ref 26.0–34.0)
MCHC: 29.9 g/dL — ABNORMAL LOW (ref 30.0–36.0)
MCV: 96.4 fL (ref 80.0–100.0)
Platelets: 250 10*3/uL (ref 150–400)
RBC: 2.81 MIL/uL — ABNORMAL LOW (ref 4.22–5.81)
RDW: 17.6 % — ABNORMAL HIGH (ref 11.5–15.5)
WBC: 8.6 10*3/uL (ref 4.0–10.5)
nRBC: 0 % (ref 0.0–0.2)

## 2019-01-08 LAB — TYPE AND SCREEN: Unit division: 0

## 2019-01-08 MED ORDER — AMOXICILLIN-POT CLAVULANATE 875-125 MG PO TABS: 1.0000 | ORAL_TABLET | Freq: Two times a day (BID) | ORAL | 0 refills | Status: AC

## 2019-01-08 MED ORDER — PANTOPRAZOLE SODIUM 40 MG PO TBEC: 40.0000 mg | DELAYED_RELEASE_TABLET | Freq: Two times a day (BID) | ORAL | 3 refills | Status: AC

## 2019-01-08 NOTE — Discharge Summary (Addendum)
Physician Discharge Summary  Gary Valencia GMW:[id number] DOB: [birth year] DOA: 01/05/2019  PCP: [name], MD  Admit date: 01/05/2019 Discharge date: 01/08/2019  Time spent: 40 minutes  Recommendations for Outpatient Follow-up:  1. Follow-up PCP in 2 weeks 2. Patient will continue with Protonix 40 mg p.o. twice daily 3. Continue Coumadin, stop aspirin.   Discharge Diagnoses:  Active Problems:   Severe anemia   Anemia   Pressure injury of skin   Discharge Condition: Stable  Diet recommendation: Mechanical soft diet with thin liquid  Filed Weights   01/06/19 0320  Weight: 65.8 kg    History of present illness:   85 year old male with history of recent CVA with residual dysarthria, prostate cancer, atrial fibrillation on anticoagulation with Coumadin, dyslipidemia, CAD status post CABG was brought to hospital with generalized weakness.  Patient had been having black-colored stool for past few days.  FOBT was negative in the ED.  Chest x-ray showed left lower lobe infiltrate with small pleural effusion.  Patient started on IV ceftriaxone and Zithromax.  Hemoglobin was 6.0 and transfused 1 unit PRBC.  GI was consulted.   Hospital Course:   1. [place]-FOBT is negative in the ED.  No urgent need for EGD as per GI.  Started on IV PPI. H&H is stable at 8.1/27.1 .  Started on dysphagia 3 diet per speech therapy.  Gi recommends starting Protonix 40 mg po bid. Will restart Coumadin. Will hold aspirin for now.  2. Anemia-patient presented with hemoglobin of 6.0, it improved to 8.1 after 1 unit PRBC.  Today hemoglobin is stable at 8.1  3. Community-acquired pneumonia-chest x-ray showed patchy infiltrate in the left base with small effusion. Started on ceftriaxone and Zithromax. Day # 4 of antibiotic therapy, will discharge on Po Augmentin 875/125 mg po bid  SARS-CoV-2 is negative.   4. Dysphagia-swallow evaluation obtained, patient is mild to moderate aspiration risk.  Started  on nectar thick liquid diet.    Speech therapy recommends dysphagia 3 diet.  Thin liquids.  5. Atrial fibrillation-chronic, persistent.  Heart rate is controlled with Metoprolol.  Anticoagulation was held in the hospital.  At this time GI says patient can recently started on Coumadin.  Will restart Coumadin at home dose.  No need to make it therapeutic as patient had recent GI bleed.  6. History of CVA-aspirin was held.  I called and discussed with patient's son, patient never had a CT or MRI head.  Patient's PCP had empirically started him on aspirin for possible CVA.  CT head done in the hospital on 01/05/2019 did not show any recent stroke.  Patient's dysarthria improved after blood transfusion and treatment for pneumonia.  I will discontinue aspirin at this time as it is a high risk for GI bleed as above.  7. History of CAD status post CABG-stable    Procedures:    Consultations:    Discharge Exam: Vitals:   01/07/19 2044 01/08/19 0409  BP: (!) 149/78 109/62  Pulse: 80 83  Resp: 18 18  Temp: 98.3 F (36.8 C) 98 F (36.7 C)  SpO2: 94% 95%    General-appears in no acute distress Heart-S1-S2, regular, no murmur auscultated Lungs-clear to auscultation bilaterally, no wheezing or crackles auscultated Abdomen-soft, nontender, no organomegaly Extremities-no edema in the lower extremities Neuro-alert, oriented x3, no focal deficit noted  Discharge Instructions   Discharge Instructions    Diet - low sodium heart healthy   Complete by:  As directed    Increase activity  slowly   Complete by:  As directed      Allergies as of 01/08/2019      Reactions   Asa Buff (mag [buffered Aspirin] Nausea Only   Zetia [ezetimibe] Other (See Comments)   Can not remember reaction      Medication List    TAKE these medications   amoxicillin-clavulanate 875-125 MG tablet Commonly known as:  AUGMENTIN Take 1 tablet by mouth 2 (two) times daily for 3 days.   metoprolol tartrate  25 MG tablet Commonly known as:  LOPRESSOR Take 0.5 tablets (12.5 mg total) by mouth 2 (two) times daily.   pantoprazole 40 MG tablet Commonly known as:  Protonix Take 1 tablet (40 mg total) by mouth 2 (two) times daily.   rosuvastatin 10 MG tablet Commonly known as:  CRESTOR TAKE 1 TABLET DAILY What changed:  when to take this   warfarin 2.5 MG tablet Commonly known as:  COUMADIN Take as directed. If you are unsure how to take this medication, talk to your nurse or doctor. Original instructions:  Take 1 tablet (2.5 mg total) by mouth daily. What changed:    how much to take  additional instructions      Allergies  Allergen Reactions  . Asa Buff (Mag [Buffered Aspirin] Nausea Only  . Zetia [Ezetimibe] Other (See Comments)    Can not remember reaction   Follow-up Information    [facility] Follow up.   Specialty:  [place] Why:  [facility] nursing/physical therpay/occupational therapy Contact information: [place] [address] [phone]        [name], MD Follow up in 2 week(s).   Specialty:  Family Medicine Contact information: [place] [address] [phone]        Sanda Klein, MD .   Specialty:  Cardiology Contact information: [address] [place] [address] [phone]            The results of significant diagnostics from this hospitalization (including imaging, microbiology, ancillary and laboratory) are listed below for reference.    Significant Diagnostic Studies: Ct Head Wo Contrast  Result Date: 01/05/2019 CLINICAL DATA:  Altered level of consciousness. EXAM: CT HEAD WITHOUT CONTRAST TECHNIQUE: Contiguous axial images were obtained from the base of the skull through the vertex without intravenous contrast. COMPARISON:  CT head 10/18/2009 FINDINGS: Brain: Moderate atrophy with mild progression. Chronic microvascular ischemic changes in the white  matter. Small chronic infarct bilateral parietal lobe unchanged. No acute infarct, hemorrhage, mass. Vascular: Negative for hyperdense vessel. Atherosclerotic calcification in the cavernous carotid bilaterally. Skull: Negative Sinuses/Orbits: Negative Other: None IMPRESSION: Moderate atrophy and chronic ischemic changes. No acute abnormality. Electronically Signed   By: Franchot Gallo M.D.   On: 01/05/2019 13:13   Dg Chest Port 1 View  Result Date: 01/05/2019 CLINICAL DATA:  Shortness of breath and difficulty speaking EXAM: PORTABLE CHEST 1 VIEW COMPARISON:  09/02/2017 FINDINGS: Cardiac shadow is mildly prominent but accentuated by the portable technique. Postsurgical changes are seen. Patchy infiltrative changes are noted in both lungs. Small left effusion is noted as well. No acute bony abnormality is noted. IMPRESSION: Patchy infiltrates worst in the left base with small effusion. Electronically Signed   By: Inez Catalina M.D.   On: 01/05/2019 13:14   Dg Swallowing Func-speech Pathology  Result Date: 01/08/2019 Objective Swallowing Evaluation: Type of Study: MBS-Modified Barium Swallow Study  Patient Details Name: Gary Valencia MRN: [id number] Date of Birth:  April 10, 1923 Today's Date: 01/08/2019 Time: SLP Start Time (ACUTE ONLY): 0915 -SLP Stop Time (ACUTE ONLY): 0935 SLP Time Calculation (min) (ACUTE ONLY): 20 min Past Medical History: Past Medical History: Diagnosis Date . AAA (abdominal aortic aneurysm) ([place])  . Actinic keratoses  . Anxiety  . ASHD (arteriosclerotic heart disease) 1984 . Atrial fibrillation ([place])  . Cancer ([place]) 1999  PROSTATE . CVA (cerebral vascular accident) ([place])  . Diverticulosis  . Dyslipidemia  . Dysrhythmia  . Personal history of colonic polyps  Past Surgical History: Past Surgical History: Procedure Laterality Date . ABDOMINAL AORTIC ENDOVASCULAR STENT GRAFT N/A 12/11/2015  Procedure: ABDOMINAL AORTIC ENDOVASCULAR STENT GRAFT;  Surgeon: Serafina Mitchell, MD;  Location: [facility];   Service: Vascular;  Laterality: N/A; . CORONARY ARTERY BYPASS GRAFT  1984  [facility]- Dr. Redmond Pulling x4 . EYE SURGERY Bilateral   cataracts . ODONTOID FRACTURE SURGERY    Odontoid screw fixation of type 2 odontoid fracture, open reduction and internal fixation of the fracture--patient had fallen down the stairs at 85 years old . OTHER SURGICAL HISTORY  01/29/1998  Stage TIC adenocarcinoma of the prostate--Surgeon--David, Grapey M.D.--Interstital seed implantation with I-125 seeds--PSA of approximately 7 . PROSTATE SURGERY  approx 2003  [place] -Grapey . TONSILLECTOMY   HPI: Patient is a 85 y.o. male with PMH: recent CVA with residual dysarthria, prostate cancer, atrial fibrilation on anticoagulation with Coumadin, dyslipidemia, CAD s/p CABG, who was brought to ER with generalized weakness, several days of black-colored stool. CXR revealed left lower lobe infiltrate with small pleural effusion; GI was consulted and he was started on PPI and clear liquids. Patient was observed to cough with clear liquids and was made NPO awaiting swallow evaluation.  Subjective: pleasant, sitting in chair in radiology suite Assessment / Plan / Recommendation CHL IP CLINICAL IMPRESSIONS 01/08/2019 Clinical Impression Patient presents with a mod-severe pharyngeal dysphagia which is likely chronic with contributing factors of h/o CVA, advanced age, h/o neck surgery. Patient exhibited delays in swallow initiation to level of vallecular sinus with all consistencies; residuals in pharynx post initial swallows at level of vallecular sinus, pyriform sinus, lateral channel and posterior pharyngeal wall with all consistencies. Penetration occured during the swallow with thin liquids and with aspiration occuring after swallow, but patient was able to clear deep penetrate and aspirate with throat clear and reswallow. Amount of penetrate and/or aspirate was trace-minimal but was fairly consistent in occurance. Chin tuck posture and head turn postures were  not beneficial in preventing or reducing penetration/aspiration and did not help in clearing pharyngeal residuals. Patient is fully aware and cogntively intake and able to perform strategy of throat clear and reswallow to clear laryngeal vestibule. He will remain at a high risk of aspiration, however improvement of dysphagia is not likely as it appears to be chronic in nature. SLP Visit Diagnosis Dysphagia, pharyngeal phase (R13.13) Attention and concentration deficit following -- Frontal lobe and executive function deficit following -- Impact on safety and function Moderate aspiration risk   CHL IP TREATMENT RECOMMENDATION 01/08/2019 Treatment Recommendations Therapy as outlined in treatment plan below   Prognosis 01/08/2019 Prognosis for Safe Diet Advancement Fair Barriers to Reach Goals -- Barriers/Prognosis Comment -- CHL IP DIET RECOMMENDATION 01/08/2019 SLP Diet Recommendations Thin liquid;Dysphagia 3 (Mech soft) solids Liquid Administration via Cup;No straw Medication Administration Crushed with puree Compensations Slow rate;Small sips/bites Postural Changes Remain semi-upright after after feeds/meals (Comment);Seated upright at 90 degrees   CHL IP OTHER RECOMMENDATIONS 01/08/2019 Recommended Consults -- Oral Care Recommendations Oral care  BID;Patient independent with oral care Other Recommendations --   CHL IP FOLLOW UP RECOMMENDATIONS 01/08/2019 Follow up Recommendations Home health SLP;Other (comment)   CHL IP FREQUENCY AND DURATION 01/08/2019 Speech Therapy Frequency (ACUTE ONLY) min 2x/week Treatment Duration 1 week      CHL IP ORAL PHASE 01/08/2019 Oral Phase WFL Oral - Pudding Teaspoon -- Oral - Pudding Cup -- Oral - Honey Teaspoon -- Oral - Honey Cup -- Oral - Nectar Teaspoon -- Oral - Nectar Cup -- Oral - Nectar Straw -- Oral - Thin Teaspoon -- Oral - Thin Cup -- Oral - Thin Straw -- Oral - Puree -- Oral - Mech Soft -- Oral - Regular -- Oral - Multi-Consistency -- Oral - Pill -- Oral Phase - Comment --  CHL  IP PHARYNGEAL PHASE 01/08/2019 Pharyngeal Phase Impaired Pharyngeal- Pudding Teaspoon -- Pharyngeal -- Pharyngeal- Pudding Cup -- Pharyngeal -- Pharyngeal- Honey Teaspoon -- Pharyngeal -- Pharyngeal- Honey Cup -- Pharyngeal -- Pharyngeal- Nectar Teaspoon -- Pharyngeal -- Pharyngeal- Nectar Cup Delayed swallow initiation-vallecula;Pharyngeal residue - valleculae;Pharyngeal residue - pyriform;Pharyngeal residue - posterior pharnyx;Lateral channel residue Pharyngeal -- Pharyngeal- Nectar Straw -- Pharyngeal -- Pharyngeal- Thin Teaspoon -- Pharyngeal -- Pharyngeal- Thin Cup Delayed swallow initiation-vallecula;Pharyngeal residue - valleculae;Pharyngeal residue - pyriform;Lateral channel residue;Penetration/Aspiration during swallow;Trace aspiration Pharyngeal Material enters airway, CONTACTS cords and then ejected out;Material enters airway, passes BELOW cords then ejected out Pharyngeal- Thin Straw -- Pharyngeal -- Pharyngeal- Puree Delayed swallow initiation-vallecula;Pharyngeal residue - pyriform;Pharyngeal residue - valleculae;Pharyngeal residue - posterior pharnyx Pharyngeal -- Pharyngeal- Mechanical Soft -- Pharyngeal -- Pharyngeal- Regular Delayed swallow initiation-vallecula;Pharyngeal residue - pyriform;Pharyngeal residue - valleculae;Pharyngeal residue - posterior pharnyx Pharyngeal -- Pharyngeal- Multi-consistency -- Pharyngeal -- Pharyngeal- Pill -- Pharyngeal -- Pharyngeal Comment --  CHL IP CERVICAL ESOPHAGEAL PHASE 01/08/2019 Cervical Esophageal Phase WFL Pudding Teaspoon -- Pudding Cup -- Honey Teaspoon -- Honey Cup -- Nectar Teaspoon -- Nectar Cup -- Nectar Straw -- Thin Teaspoon -- Thin Cup -- Thin Straw -- Puree -- Mechanical Soft -- Regular -- Multi-consistency -- Pill -- Cervical Esophageal Comment -- Dannial Monarch 01/08/2019, 10:46 AM Sonia Baller, MA, CCC-SLP Speech Therapy [facility]               Microbiology: Recent Results (from the past 240 hour(s))  SARS Coronavirus 2 (Hosp  order,Performed in [place] lab via [employer] [facility])     Status: None   Collection Time: 01/05/19 10:45 AM  Result Value Ref Range Status   SARS Coronavirus 2 ([facility] [facility] Now) NEGATIVE NEGATIVE Final    Comment: (NOTE) Interpretive Result Comment(s): COVID 19 Positive SARS CoV 2 target nucleic acids are DETECTED. The SARS CoV 2 RNA is generally detectable in upper and lower respiratory specimens during the acute phase of infection.  Positive results are indicative of active infection with SARS CoV 2.  Clinical correlation with patient history and other diagnostic information is necessary to determine patient infection status.  Positive results do not rule out bacterial infection or coinfection with other viruses. The expected result is Negative. COVID 19 Negative SARS CoV 2 target nucleic acids are NOT DETECTED. The SARS CoV 2 RNA is generally detectable in upper and lower respiratory specimens during the acute phase of infection.  Negative results do not preclude SARS CoV 2 infection, do not rule out coinfections with other pathogens, and should not be used as the sole basis for treatment or other patient management decisions.  Negative results must be combined with clinical  observations, patient history,  and epidemiological information. The expected result is Negative. Invalid Presence or absence of SARS CoV 2 nucleic acids cannot be determined. Repeat testing was performed on the submitted specimen and repeated Invalid results were obtained.  If clinically indicated, additional testing on a new specimen with an alternate test methodology [phone]) is advised.  The SARS CoV 2 RNA is generally detectable in upper and lower respiratory specimens during the acute phase of infection. The expected result is Negative. Fact Sheet for Patients:  GolfingFamily.no Fact Sheet for Healthcare Providers: https://www.hernandez-brewer.com/ This test is not yet  approved or cleared by the [place] FDA and has been authorized for detection and/or diagnosis of SARS CoV 2 by FDA under an Emergency Use Authorization (EUA).  This EUA will remain in effect (meaning this test can be used) for the duration of the COVID19 d eclaration under Section 564(b)(1) of the Act, 21 U.S.C. section [phone] 3(b)(1), unless the authorization is terminated or revoked sooner. Performed at [facility], [address] [place], [place] [address]   MRSA PCR Screening     Status: None   Collection Time: 01/05/19  5:43 PM  Result Value Ref Range Status   MRSA by PCR NEGATIVE NEGATIVE Final    Comment:        The GeneXpert MRSA Assay (FDA approved for NASAL specimens only), is one component of a comprehensive MRSA colonization surveillance program. It is not intended to diagnose MRSA infection nor to guide or monitor treatment for MRSA infections. Performed at [facility], [place] [address], [place], [place] [address]      Labs: Basic Metabolic Panel: Recent Labs  Lab 01/05/19 1045 01/06/19 0248 01/07/19 0303  NA 136 137 139  K 4.3 4.1 4.3  CL 107 109 106  CO2 22 20* 23  GLUCOSE 128* 76 63*  BUN 22 18 17   CREATININE 1.11 0.95 0.95  CALCIUM 8.0* 7.8* 8.2*   Liver Function Tests: Recent Labs  Lab 01/05/19 1045 01/06/19 0248 01/07/19 0303  AST 21 17 17   ALT 13 14 12   ALKPHOS 127* 111 114  BILITOT 0.4 0.5 0.6  PROT 6.9 5.9* 6.2*  ALBUMIN 2.6* 2.4* 2.3*   No results for input(s): LIPASE, AMYLASE in the last 168 hours. No results for input(s): AMMONIA in the last 168 hours. CBC: Recent Labs  Lab 01/05/19 1045 01/05/19 1911 01/06/19 0248 01/07/19 0303 01/08/19 0438  WBC 7.0 6.5 5.7 6.3 8.6  HGB 6.0* 7.5* 7.2* 7.4* 8.1*  HCT 21.1* 25.7* 24.0* 25.0* 27.1*  MCV 98.1 97.0 97.6 97.3 96.4  PLT 271 263 223 231 250   Cardiac Enzymes: Recent Labs  Lab 01/05/19 1045  TROPONINI 0.03*   BNP: BNP (last 3  results) Recent Labs    01/05/19 1045  BNP 563.8*    CBG: Recent Labs  Lab 01/07/19 0614 01/07/19 1025 01/07/19 2041 01/08/19 0722 01/08/19 1122  GLUCAP 69* 92 76 83 102*     Signed:  Oswald Hillock MD.  [facility] 01/08/2019, 1:17 PM

## 2019-01-08 NOTE — Discharge Instructions (Signed)
Soft-Food Eating Plan A soft-food eating plan includes foods that are safe and easy to chew and swallow. Your health care provider or dietitian can help you find foods and flavors that fit into this plan. Follow this plan until your health care provider or dietitian says it is safe to start eating other foods and food textures. What are tips for following this plan? General guidelines   Take small bites of food, or cut food into pieces about  inch or smaller. Bite-sized pieces of food are easier to chew and swallow.  Eat moist foods. Avoid overly dry foods.  Avoid foods that: ? Are difficult to swallow, such as dry, chunky, crispy, or sticky foods. ? Are difficult to chew, such as hard, tough, or stringy foods. ? Contain nuts, seeds, or fruits.  Follow instructions from your dietitian about the types of liquids that are safe for you to swallow. You may be allowed to have:      Thin liquids  Cooking  Cook meats so they stay tender and moist. Use methods like braising, stewing, or baking in liquid.  Cook vegetables and fruit until they are soft enough to be mashed with a fork.  Peel soft, fresh fruits such as peaches, nectarines, and melons.  When making soup, make sure chunks of meat and vegetables are smaller than  inch.  Reheat leftover foods slowly so that a tough crust does not form.  How do I prepare foods? Make food pieces smaller than 1 inch (2.54 cm). To moisten foods and add flavor, serve your food with gravies or sauces. Pour sauce or gravy over bread slices or syrup over pancakes. Allow the food to soften. Cook vegetables until they are tender. Prepared foods can be frozen in small portions and reheated later. When you reheat foods, do not allow a tough outer crust to form on the food. This can make the food hard to swallow. Which foods can I eat? Remember to make food pieces smaller than 1 inch (2.54 cm). Grains: Soft pancakes, breads, sweet rolls, [place] pastries,  and [place] toast with syrup or sauce Soft dumplings moistened with butter or gravy Rice or wild rice, if healthcare providers tell you it is safe for you Dry cereals moistened and softened with milk or cooked cereals Cakes or cookies moistened and softened with milk, coffee, or other liquid Vegetables and fruits: Cooked, boiled, baked, or mashed potatoes Cooked, tender vegetables Shredded lettuce Soft, peeled fresh fruits such as peaches, nectarines, kiwi, mangoes, cantaloupe, honeydew melon, or watermelon Canned or cooked fruits without seeds or skins Dairy products: Pudding, custard, or cottage cheese Ice cream, sherbet, frozen yogurt, and malts Meat and other protein foods: Moistened ground or tender cooked meat, poultry, and fish with gravy or sauce Eggs Casseroles with small chunks of meat, ground meats, or tender meats Strained corn and clam chowder  Which foods should I avoid? Grains: Dry toast, crackers, or tough crusty breads such as [place] bread Bread that contains nuts Very coarse cereals, such as shredded wheat or bran cereals Dry cakes or dry or chewy cookies Vegetables and fruits: Raw or fried vegetables Tough, crisp-fried potatoes or potato skins Cooked corn Fruits that are difficult to chew, such as apples or pears Stringy fruits, such as pineapple, papaya, or mango Grapes, dried fruits, or coconut Dairy, meat, and other foods: Tough, dry meats and poultry (chicken and [place]) Nuts, seeds, or peanut butter Pizza Soups with tough meats Chewy caramel or taffy Yogurt that contains nuts  What foods are allowed? The items listed below may not be a complete list. Talk with your dietitian about what dietary choices are best for you. Grains Breads, muffins, pancakes, or waffles moistened with syrup, jelly, or butter. Dry cereals well-moistened with milk. Moist, cooked cereals. Well-cooked pasta and rice. Vegetables All soft-cooked vegetables. Shredded  lettuce. Fruits All canned and cooked fruits. Soft, peeled fresh fruits. Strawberries. Dairy Milk. Cream. Yogurt. Cottage cheese. Soft cheese without the rind. Meats and other protein foods Tender, moist ground meat, poultry, or fish. Meat cooked in gravy or sauces. Eggs. Sweets and desserts Ice cream. Milk shakes. Sherbet. Pudding. Fats and oils Butter. Margarine. Olive, canola, sunflower, and grapeseed oil. Smooth salad dressing. Smooth cream cheese. Mayonnaise. Gravy. What foods are not allowed? The items listed bemay not be a complete list. Talk with your dietitian about what dietary choices are best for you. Grains Coarse or dry cereals, such as bran, granola, and shredded wheat. Tough or chewy crusty breads, such as [place] bread or baguettes. Breads with nuts, seeds, or fruit. Vegetables All raw vegetables. Cooked corn. Cooked vegetables that are tough or stringy. Tough, crisp, fried potatoes and potato skins. Fruits Fresh fruits with skins or seeds, or both, such as apples, pears, and grapes. Stringy, high-pulp fruits, such as papaya, pineapple, coconut, and mango. Fruit leather and all dried fruit. Dairy Yogurt with nuts or coconut. Meats and other protein foods Hard, dry sausages. Dry meat, poultry, or fish. Meats with gristle. Fish with bones. Fried meat or fish. Lunch meat and hotdogs. Nuts and seeds. Chunky peanut butter or other nut butters. Sweets and desserts Cakes or cookies that are very dry or chewy. Desserts with dried fruit, nuts, or coconut. Fried pastries. Very rich pastries. Fats and oils Cream cheese with fruit or nuts. Salad dressings with seeds or chunks. Summary  A soft-food eating plan includes foods that are safe and easy to swallow. Generally, the foods should be soft enough to be mashed with a fork.  Avoid foods that are dry, hard to chew, crunchy, sticky, stringy, or crispy.  Ask your health care provider whether you need to thicken your liquids and if  you need to take a fiber supplement. This information is not intended to replace advice given to you by your health care provider. Make sure you discuss any questions you have with your health care provider. Document Released: 11/16/2007 Document Revised: 10/12/2016 Document Reviewed: 10/12/2016 Elsevier Interactive Patient Education  2019 [employer].

## 2019-01-08 NOTE — Care Management Important Message (Signed)
Important Message  Patient Details IM Letter given to [facility] Case Manager to present to the Patient Name: Gary Valencia MRN: [id number] Date of Birth: Aug 25, 1922   Medicare Important Message Given:  Yes    Kerin Salen 01/08/2019, 10:46 AMImportant Message  Patient Details  Name: Gary Valencia MRN: [id number] Date of Birth: 09-08-1922   Medicare Important Message Given:  Yes    Kerin Salen 01/08/2019, 10:46 AM

## 2019-01-08 NOTE — Progress Notes (Signed)
Modified Barium Swallow Progress Note  Patient Details  Name: Gary Valencia MRN: [id number] Date of Birth: 19-Sep-1922  Today's Date: 01/08/2019  Modified Barium Swallow completed.  Full report located under Chart Review in the Imaging Section.  Brief recommendations include the following:  Clinical Impression  Patient presents with a mod-severe pharyngeal dysphagia which is likely chronic with contributing factors of h/o CVA, advanced age, h/o neck surgery. Patient exhibited delays in swallow initiation to level of vallecular sinus with all consistencies; residuals in pharynx post initial swallows at level of vallecular sinus, pyriform sinus, lateral channel and posterior pharyngeal wall with all consistencies. Penetration occured during the swallow with thin liquids and with aspiration occuring after swallow, but patient was able to clear deep penetrate and aspirate with throat clear and reswallow. Amount of penetrate and/or aspirate was trace-minimal but was fairly consistent in occurance. Chin tuck posture and head turn postures were not beneficial in preventing or reducing penetration/aspiration and did not help in clearing pharyngeal residuals. Patient is fully aware and cogntively intake and able to perform strategy of throat clear and reswallow to clear laryngeal vestibule. He will remain at a high risk of aspiration, however improvement of dysphagia is not likely as it appears to be chronic in nature.   Swallow Evaluation Recommendations       SLP Diet Recommendations: Thin liquid;Dysphagia 3 (Mech soft) solids   Liquid Administration via: Cup;No straw   Medication Administration: Crushed with puree   Supervision: Patient able to self feed;Intermittent supervision to cue for compensatory strategies   Compensations: Slow rate;Small sips/bites(frequent throat clear and reswallow)   Postural Changes: Remain semi-upright after after feeds/meals (Comment);Seated upright at 90  degrees   Oral Care Recommendations: Oral care BID;Patient independent with oral care        Gary Valencia 01/08/2019,10:48 AM   Sonia Baller, MA, CCC-SLP Speech Therapy [facility]

## 2019-01-08 NOTE — TOC Transition Note (Signed)
Transition of Care [facility]) - CM/SW Discharge Note   Patient Details  Name: Gary Valencia MRN: [id number] Date of Birth: [birth year]  Transition of Care [facility] CM/SW Contact:  Dessa Phi, RN Phone Number: 01/08/2019, 12:24 PM   Clinical Narrative:   lready active w/[facility]-HHR[facility]/PT/OT-rep Cassie. Son aware of d/c, & will transport home. No further CM needs.    Final next level of care: [place] Barriers to Discharge: No Barriers Identified   Patient Goals and CMS Choice Patient states their goals for this hospitalization and ongoing recovery are:: go home CMS Medicare.gov Compare Post Acute Care list provided to:: Patient Represenative (must comment)(son) Choice offered to / list presented to : Adult Children  Discharge Placement                       Discharge Plan and Services                          HH Arranged: RN, PT, OT [facility] Agency: [facility] Date [place]: 01/08/19 Time [place]: 1224 Representative spoke with at [place]: Cassie  Social Determinants of Health ([place]) Interventions     Readmission Risk Interventions No flowsheet data found.

## 2019-01-09 ENCOUNTER — Telehealth: Payer: Self-pay | Admitting: Internal Medicine

## 2019-01-09 LAB — TYPE AND SCREEN
ABO/RH(D): O NEG
Antibody Screen: NEGATIVE
Unit division: 0

## 2019-01-09 LAB — BPAM RBC
Blood Product Expiration Date: 202006112359
Unit Type and Rh: 9500

## 2019-01-09 NOTE — Telephone Encounter (Signed)
Called pt and spoke to wife about hospital discharge. I went over medications and pt will follow-up next Wednesday 5/27

## 2019-01-10 ENCOUNTER — Telehealth: Payer: Self-pay

## 2019-01-10 ENCOUNTER — Telehealth: Payer: Self-pay | Admitting: Family Medicine

## 2019-01-10 NOTE — Telephone Encounter (Signed)
[facility] is requesting to have in home pt since discharge 2 times for 1 week, 1 time a week for a week and then 2 times a week for 1 week. Please also advise if we are going to request labs for virtual visit next week. Please advise [facility]

## 2019-01-10 NOTE — Telephone Encounter (Signed)
Mo from [employer] called requesting verbal orders for speech therapy verbal evaluations and skilled nursing  For 2 times a week for 2 weeks and then 1 time a week for 2 weeks    She can be reached at [phone]

## 2019-01-10 NOTE — Telephone Encounter (Signed)
Okay.  Also get them to draw CBC/C met

## 2019-01-10 NOTE — Telephone Encounter (Signed)
Called and gave ok for PT and sent a community message to christy to be advised if labs can be done before virtual appointment next week [facility]

## 2019-01-10 NOTE — Telephone Encounter (Signed)
okay for PT and CBC/C met

## 2019-01-11 ENCOUNTER — Other Ambulatory Visit: Payer: Self-pay

## 2019-01-11 DIAGNOSIS — R531 Weakness: Secondary | ICD-10-CM

## 2019-01-12 ENCOUNTER — Telehealth: Payer: Self-pay | Admitting: Family Medicine

## 2019-01-12 ENCOUNTER — Ambulatory Visit (INDEPENDENT_AMBULATORY_CARE_PROVIDER_SITE_OTHER): Payer: Medicare Other | Admitting: Pharmacist Clinician (PhC)/ Clinical Pharmacy Specialist

## 2019-01-12 DIAGNOSIS — Z7901 Long term (current) use of anticoagulants: Secondary | ICD-10-CM

## 2019-01-12 DIAGNOSIS — I482 Chronic atrial fibrillation, unspecified: Secondary | ICD-10-CM

## 2019-01-12 LAB — POCT INR: INR: 3 (ref 2.0–3.0)

## 2019-01-12 NOTE — Telephone Encounter (Signed)
[facility] called and they will try to get blood drawn at Tuesday's at home visit.

## 2019-01-16 ENCOUNTER — Inpatient Hospital Stay (HOSPITAL_COMMUNITY)
Admission: EM | Admit: 2019-01-16 | Discharge: 2019-01-19 | DRG: 193 | Disposition: A | Discharge status: Home Health | Payer: Medicare Other | Attending: Internal Medicine | Admitting: Internal Medicine

## 2019-01-16 ENCOUNTER — Other Ambulatory Visit: Payer: Self-pay

## 2019-01-16 ENCOUNTER — Emergency Department (HOSPITAL_COMMUNITY): Payer: Medicare Other

## 2019-01-16 ENCOUNTER — Telehealth: Payer: Self-pay | Admitting: Family Medicine

## 2019-01-16 ENCOUNTER — Encounter (HOSPITAL_COMMUNITY): Payer: Self-pay

## 2019-01-16 DIAGNOSIS — E785 Hyperlipidemia, unspecified: Secondary | ICD-10-CM

## 2019-01-16 DIAGNOSIS — I110 Hypertensive heart disease with heart failure: Secondary | ICD-10-CM | POA: Diagnosis present

## 2019-01-16 DIAGNOSIS — I361 Nonrheumatic tricuspid (valve) insufficiency: Secondary | ICD-10-CM | POA: Diagnosis not present

## 2019-01-16 DIAGNOSIS — D509 Iron deficiency anemia, unspecified: Secondary | ICD-10-CM | POA: Diagnosis present

## 2019-01-16 DIAGNOSIS — I4821 Permanent atrial fibrillation: Secondary | ICD-10-CM | POA: Diagnosis present

## 2019-01-16 DIAGNOSIS — Z860100 Personal history of colon polyps, unspecified: Secondary | ICD-10-CM

## 2019-01-16 DIAGNOSIS — J189 Pneumonia, unspecified organism: Principal | ICD-10-CM

## 2019-01-16 DIAGNOSIS — E7800 Pure hypercholesterolemia, unspecified: Secondary | ICD-10-CM | POA: Diagnosis not present

## 2019-01-16 DIAGNOSIS — Y95 Nosocomial condition: Secondary | ICD-10-CM | POA: Diagnosis present

## 2019-01-16 DIAGNOSIS — K5790 Diverticulosis of intestine, part unspecified, without perforation or abscess without bleeding: Secondary | ICD-10-CM | POA: Diagnosis present

## 2019-01-16 DIAGNOSIS — J9601 Acute respiratory failure with hypoxia: Secondary | ICD-10-CM | POA: Diagnosis present

## 2019-01-16 DIAGNOSIS — I248 Other forms of acute ischemic heart disease: Secondary | ICD-10-CM | POA: Diagnosis present

## 2019-01-16 DIAGNOSIS — Z888 Allergy status to other drugs, medicaments and biological substances status: Secondary | ICD-10-CM | POA: Diagnosis not present

## 2019-01-16 DIAGNOSIS — Z20828 Contact with and (suspected) exposure to other viral communicable diseases: Secondary | ICD-10-CM | POA: Diagnosis present

## 2019-01-16 DIAGNOSIS — I714 Abdominal aortic aneurysm, without rupture: Secondary | ICD-10-CM | POA: Diagnosis present

## 2019-01-16 DIAGNOSIS — J690 Pneumonitis due to inhalation of food and vomit: Secondary | ICD-10-CM | POA: Diagnosis present

## 2019-01-16 DIAGNOSIS — Z515 Encounter for palliative care: Secondary | ICD-10-CM | POA: Diagnosis not present

## 2019-01-16 DIAGNOSIS — Z8673 Personal history of transient ischemic attack (TIA), and cerebral infarction without residual deficits: Secondary | ICD-10-CM | POA: Diagnosis not present

## 2019-01-16 DIAGNOSIS — I2510 Atherosclerotic heart disease of native coronary artery without angina pectoris: Secondary | ICD-10-CM

## 2019-01-16 DIAGNOSIS — D649 Anemia, unspecified: Secondary | ICD-10-CM | POA: Diagnosis present

## 2019-01-16 DIAGNOSIS — R0602 Shortness of breath: Secondary | ICD-10-CM

## 2019-01-16 DIAGNOSIS — I10 Essential (primary) hypertension: Secondary | ICD-10-CM

## 2019-01-16 DIAGNOSIS — Z7901 Long term (current) use of anticoagulants: Secondary | ICD-10-CM

## 2019-01-16 DIAGNOSIS — I482 Chronic atrial fibrillation, unspecified: Secondary | ICD-10-CM

## 2019-01-16 DIAGNOSIS — Z951 Presence of aortocoronary bypass graft: Secondary | ICD-10-CM

## 2019-01-16 DIAGNOSIS — I5031 Acute diastolic (congestive) heart failure: Secondary | ICD-10-CM | POA: Diagnosis present

## 2019-01-16 DIAGNOSIS — Z7189 Other specified counseling: Secondary | ICD-10-CM | POA: Diagnosis not present

## 2019-01-16 DIAGNOSIS — Z886 Allergy status to analgesic agent status: Secondary | ICD-10-CM | POA: Diagnosis not present

## 2019-01-16 DIAGNOSIS — L570 Actinic keratosis: Secondary | ICD-10-CM | POA: Diagnosis present

## 2019-01-16 DIAGNOSIS — Z8701 Personal history of pneumonia (recurrent): Secondary | ICD-10-CM

## 2019-01-16 DIAGNOSIS — Z79899 Other long term (current) drug therapy: Secondary | ICD-10-CM | POA: Diagnosis not present

## 2019-01-16 DIAGNOSIS — T17908S Unspecified foreign body in respiratory tract, part unspecified causing other injury, sequela: Secondary | ICD-10-CM | POA: Diagnosis not present

## 2019-01-16 DIAGNOSIS — T17908A Unspecified foreign body in respiratory tract, part unspecified causing other injury, initial encounter: Secondary | ICD-10-CM

## 2019-01-16 DIAGNOSIS — Z8249 Family history of ischemic heart disease and other diseases of the circulatory system: Secondary | ICD-10-CM | POA: Diagnosis not present

## 2019-01-16 DIAGNOSIS — Z8546 Personal history of malignant neoplasm of prostate: Secondary | ICD-10-CM

## 2019-01-16 LAB — PHOSPHORUS: Phosphorus: 3.6 mg/dL (ref 2.5–4.6)

## 2019-01-16 LAB — BASIC METABOLIC PANEL
Anion gap: 7 (ref 5–15)
BUN: 21 mg/dL (ref 8–23)
CO2: 25 mmol/L (ref 22–32)
Calcium: 8.5 mg/dL — ABNORMAL LOW (ref 8.9–10.3)
Chloride: 109 mmol/L (ref 98–111)
Creatinine, Ser: 1.12 mg/dL (ref 0.61–1.24)
GFR calc Af Amer: >60 mL/min (ref >60)
GFR calc non Af Amer: 56 mL/min — ABNORMAL LOW (ref >60)
Glucose, Bld: 109 mg/dL — ABNORMAL HIGH (ref 70–99)
Potassium: 4.3 mmol/L (ref 3.5–5.1)
Sodium: 141 mmol/L (ref 135–145)

## 2019-01-16 LAB — CBC WITH DIFFERENTIAL/PLATELET
Abs Immature Granulocytes: 0.04 10*3/uL (ref 0.00–0.07)
Basophils Absolute: 0 10*3/uL (ref 0.0–0.1)
Basophils Relative: 1 %
Eosinophils Absolute: 0.3 10*3/uL (ref 0.0–0.5)
Eosinophils Relative: 5 %
HCT: 28.6 % — ABNORMAL LOW (ref 39.0–52.0)
Hemoglobin: 8.3 g/dL — ABNORMAL LOW (ref 13.0–17.0)
Immature Granulocytes: 1 %
Lymphocytes Relative: 8 %
Lymphs Abs: 0.6 10*3/uL — ABNORMAL LOW (ref 0.7–4.0)
MCH: 28 pg (ref 26.0–34.0)
MCHC: 29 g/dL — ABNORMAL LOW (ref 30.0–36.0)
MCV: 96.6 fL (ref 80.0–100.0)
Monocytes Absolute: 0.4 10*3/uL (ref 0.1–1.0)
Monocytes Relative: 6 %
Neutro Abs: 5.8 10*3/uL (ref 1.7–7.7)
Neutrophils Relative %: 79 %
Platelets: 374 10*3/uL (ref 150–400)
RBC: 2.96 MIL/uL — ABNORMAL LOW (ref 4.22–5.81)
RDW: 17.9 % — ABNORMAL HIGH (ref 11.5–15.5)
WBC: 7.2 10*3/uL (ref 4.0–10.5)
nRBC: 0 % (ref 0.0–0.2)

## 2019-01-16 LAB — T4, FREE: Free T4: 1 ng/dL (ref 0.82–1.77)

## 2019-01-16 LAB — LACTIC ACID, PLASMA
Lactic Acid, Venous: 1.2 mmol/L (ref 0.5–1.9)
Lactic Acid, Venous: 1.5 mmol/L (ref 0.5–1.9)

## 2019-01-16 LAB — BRAIN NATRIURETIC PEPTIDE: B Natriuretic Peptide: 494.8 pg/mL — ABNORMAL HIGH (ref 0.0–100.0)

## 2019-01-16 LAB — MAGNESIUM: Magnesium: 2.1 mg/dL (ref 1.7–2.4)

## 2019-01-16 LAB — TSH: TSH: 4.125 u[IU]/mL (ref 0.350–4.500)

## 2019-01-16 LAB — PROTIME-INR
INR: 6.1 (ref 0.8–1.2)
Prothrombin Time: 52.9 seconds — ABNORMAL HIGH (ref 11.4–15.2)

## 2019-01-16 LAB — SARS CORONAVIRUS 2 BY RT PCR (HOSPITAL ORDER, PERFORMED IN ~~LOC~~ HOSPITAL LAB): SARS Coronavirus 2: NEGATIVE

## 2019-01-16 LAB — TROPONIN I: Troponin I: 0.05 ng/mL (ref <0.03)

## 2019-01-16 MED ORDER — SODIUM CHLORIDE 0.9% FLUSH
3.0000 mL | Freq: Two times a day (BID) | INTRAVENOUS | Status: DC
  Administered 2019-01-16 – 2019-01-18 (×4): 3 mL via INTRAVENOUS

## 2019-01-16 MED ORDER — SODIUM CHLORIDE 0.9 % IV SOLN: 250.0000 mL | INTRAVENOUS | Status: DC | PRN

## 2019-01-16 MED ORDER — METOPROLOL TARTRATE 25 MG PO TABS
12.5000 mg | ORAL_TABLET | Freq: Two times a day (BID) | ORAL | Status: DC
  Administered 2019-01-16 – 2019-01-18 (×4): 12.5 mg via ORAL
  Filled 2019-01-16 (×4): qty 1

## 2019-01-16 MED ORDER — FUROSEMIDE 10 MG/ML IJ SOLN
40.0000 mg | Freq: Every day | INTRAMUSCULAR | Status: DC
  Administered 2019-01-17 – 2019-01-18 (×2): 40 mg via INTRAVENOUS
  Filled 2019-01-16 (×2): qty 4

## 2019-01-16 MED ORDER — PANTOPRAZOLE SODIUM 40 MG PO TBEC
40.0000 mg | DELAYED_RELEASE_TABLET | Freq: Two times a day (BID) | ORAL | Status: DC
  Administered 2019-01-16 – 2019-01-19 (×6): 40 mg via ORAL
  Filled 2019-01-16 (×6): qty 1

## 2019-01-16 MED ORDER — SODIUM CHLORIDE 0.9 % IV SOLN
2.0000 g | Freq: Two times a day (BID) | INTRAVENOUS | Status: DC
  Administered 2019-01-16 – 2019-01-17 (×2): 2 g via INTRAVENOUS
  Filled 2019-01-16 (×2): qty 2

## 2019-01-16 MED ORDER — PIPERACILLIN-TAZOBACTAM 3.375 G IVPB 30 MIN
3.3750 g | Freq: Once | INTRAVENOUS | Status: AC
  Administered 2019-01-16: 3.375 g via INTRAVENOUS
  Filled 2019-01-16: qty 50

## 2019-01-16 MED ORDER — GUAIFENESIN ER 600 MG PO TB12
1200.0000 mg | ORAL_TABLET | Freq: Two times a day (BID) | ORAL | Status: DC
  Administered 2019-01-16 – 2019-01-18 (×4): 1200 mg via ORAL
  Filled 2019-01-16 (×5): qty 2

## 2019-01-16 MED ORDER — VANCOMYCIN HCL IN DEXTROSE 750-5 MG/150ML-% IV SOLN
750.0000 mg | INTRAVENOUS | Status: DC
  Filled 2019-01-16: qty 150

## 2019-01-16 MED ORDER — BISACODYL 10 MG RE SUPP: 10.0000 mg | Freq: Every day | RECTAL | Status: DC | PRN

## 2019-01-16 MED ORDER — VANCOMYCIN HCL 10 G IV SOLR
1250.0000 mg | Freq: Once | INTRAVENOUS | Status: AC
  Administered 2019-01-16: 1250 mg via INTRAVENOUS
  Filled 2019-01-16: qty 1250

## 2019-01-16 MED ORDER — ALBUTEROL SULFATE (2.5 MG/3ML) 0.083% IN NEBU: 2.5000 mg | INHALATION_SOLUTION | RESPIRATORY_TRACT | Status: DC | PRN

## 2019-01-16 MED ORDER — ROSUVASTATIN CALCIUM 10 MG PO TABS
10.0000 mg | ORAL_TABLET | ORAL | Status: DC
  Administered 2019-01-18: 10 mg via ORAL
  Filled 2019-01-16: qty 1

## 2019-01-16 MED ORDER — ACETAMINOPHEN 650 MG RE SUPP: 650.0000 mg | Freq: Four times a day (QID) | RECTAL | Status: DC | PRN

## 2019-01-16 MED ORDER — ONDANSETRON HCL 4 MG/2ML IJ SOLN: 4.0000 mg | Freq: Four times a day (QID) | INTRAMUSCULAR | Status: DC | PRN

## 2019-01-16 MED ORDER — SENNOSIDES-DOCUSATE SODIUM 8.6-50 MG PO TABS: 1.0000 | ORAL_TABLET | Freq: Every evening | ORAL | Status: DC | PRN

## 2019-01-16 MED ORDER — ONDANSETRON HCL 4 MG PO TABS: 4.0000 mg | ORAL_TABLET | Freq: Four times a day (QID) | ORAL | Status: DC | PRN

## 2019-01-16 MED ORDER — TRAMADOL HCL 50 MG PO TABS: 50.0000 mg | ORAL_TABLET | Freq: Four times a day (QID) | ORAL | Status: DC | PRN

## 2019-01-16 MED ORDER — FUROSEMIDE 10 MG/ML IJ SOLN
20.0000 mg | Freq: Once | INTRAMUSCULAR | Status: AC
  Administered 2019-01-16: 20 mg via INTRAVENOUS
  Filled 2019-01-16: qty 4

## 2019-01-16 MED ORDER — SODIUM CHLORIDE 0.9% FLUSH: 3.0000 mL | INTRAVENOUS | Status: DC | PRN

## 2019-01-16 MED ORDER — ACETAMINOPHEN 325 MG PO TABS: 650.0000 mg | ORAL_TABLET | Freq: Four times a day (QID) | ORAL | Status: DC | PRN

## 2019-01-16 NOTE — ED Triage Notes (Signed)
Pt BIB EMS from home. Home health nurse reports 78% RA. Pt denies SOB. Pt has hx anemia. Pt reports slight SOB with activity and this is his normal. Pt alert and oriented x4.

## 2019-01-16 NOTE — Progress Notes (Signed)
Pharmacy Antibiotic Note  Gary Valencia is a 85 y.o. male presented to the ED on 01/16/2019 with c/o SOB.  CXR on 5/26 showed findings with concern for PNA.  To start vancomycin cefepime for infection.   Plan: - vancomycin 1250 mg IV x1 given in the ED, then 750 mg IV q24h for est AUC 546 - Adjust cefepime dose to 2gm IV q12h for indication and renal function __________________________________  Height: 5\' 7"  (170.2 cm) Weight: 130 lb (59 kg) IBW/kg (Calculated) : 66.1  Temp (24hrs), Avg:97.6 F (36.4 C), Min:97.6 F (36.4 C), Max:97.6 F (36.4 C)  Recent Labs  Lab 01/16/19 1550 01/16/19 1605  WBC 7.2  --   CREATININE 1.12  --   LATICACIDVEN  --  1.2    Estimated Creatinine Clearance: 33.7 mL/min (by C-G formula based on SCr of 1.12 mg/dL).    Allergies  Allergen Reactions  . Asa Buff (Mag [Buffered Aspirin] Nausea Only  . Zetia [Ezetimibe] Other (See Comments)    Can not remember reaction    Thank you for allowing pharmacy to be a part of this patient's care.  Lynelle Doctor 01/16/2019 6:42 PM

## 2019-01-16 NOTE — ED Notes (Signed)
Hospitalist bedside 

## 2019-01-16 NOTE — Telephone Encounter (Signed)
[facility] called to advised pt nail beds are dusty and pulsox 75 - 88% and going down.  Dr. Redmond School advised her to call EMS.

## 2019-01-16 NOTE — Progress Notes (Signed)
[place] for warfarin Indication: hx atrial fibrillation  Allergies  Allergen Reactions  . Asa Buff (Mag [Buffered Aspirin] Nausea Only  . Zetia [Ezetimibe] Other (See Comments)    Can not remember reaction    Patient Measurements: Height: 5\' 7"  (170.2 cm) Weight: 130 lb (59 kg) IBW/kg (Calculated) : 66.1 Heparin Dosing Weight:  Vital Signs: Temp: 97.6 F (36.4 C) (05/26 1510) Temp Source: Oral (05/26 1510) BP: 122/59 (05/26 1800) Pulse Rate: 67 (05/26 1800)  Labs: Recent Labs    01/16/19 1550  HGB 8.3*  HCT 28.6*  PLT 374  CREATININE 1.12  TROPONINI 0.05*    Estimated Creatinine Clearance: 33.7 mL/min (by C-G formula based on SCr of 1.12 mg/dL).   Medications:  PTA warfarin regimen: 2.5 mg daily except 1.25 mg on MWF (last dose taken on 5/26)  Assessment: Patient is a 85 y.o M with hx afib on warfarin PTA, presented to the ED with SOB.  Warfarin resumed on admission.   Goal of Therapy:  INR 2-3 Monitor platelets by anticoagulation protocol: Yes   Plan:  - Patient has already taken his dose for today.  F/u with AM labs and dose based on INR value - monitor for s/s bleeding  Mahala Rommel P 01/16/2019,6:58 PM

## 2019-01-16 NOTE — Progress Notes (Signed)
CRITICAL VALUE STICKER  CRITICAL VALUE:  INR 6.1  RECEIVER (on-site recipient of call): Traci Sermon, RN  DATE & TIME NOTIFIED:  01/16/19  @ 2145  MD NOTIFIED:  Lamar Blinks  TIME OF NOTIFICATION: 2148  RESPONSE:  Still awaiting orders

## 2019-01-16 NOTE — H&P (Signed)
History and Physical    Gary Valencia MVE:[id number] DOB: [birth year] DOA: 01/16/2019  PCP: [name], MD   Patient coming from: Home  Chief Complaint: Low Oxygen Saturation; SOB with Exertion  HPI: Gary Valencia is a 85 y.o. male with medical history significant of AAA, history of actinic keratosis, history of anxiety, history of atherosclerotic heart disease with CAD status post CABG in 1984, history of permanent atrial fibrillation on chronic anticoagulation with warfarin, history of prostate cancer, hyperlipidemia, anxiety, history of anemia, and other comorbidities who presents with low oxygen saturation and shortness of muscle exertion.  Of note patient was recently hospitalized 11 days ago for symptomatic anemia and pneumonia and was discharged home on the 18th.  Patient went home and he was doing relatively well but home health nurse came out today and noticed that his oxygen saturations were low.  Patient was not symptomatic but he was directed to the emergency room for further evaluation.  Patient did admit to having some shortness of breath on exertion but denies chest pain, lightheadedness or dizziness.  Chest x-ray showed worsening pneumonia and possible concomitant CHF exacerbation and so patient was admitted for his acute respiratory failure with hypoxia.  He was placed on broad-spectrum antibiotics for H CAP coverage and given gentle diuresis with IV 20 mg of Lasix.  BNP was elevated on examination and will obtain an echocardiogram.  TRH was called admit this patient and he was placed on medical telemetry.  ED Course: ED patient had basic blood work done and was given a dose of IV 20 mg of Lasix and was also placed on broad-spectrum antibiotics with vancomycin and Zosyn.  Chest x-ray was also obtained.  COVID testing was NEGATIVE  Review of Systems: As per HPI otherwise 10 point review of systems negative.   Past Medical History:  Diagnosis Date   AAA (abdominal aortic  aneurysm) (HCC)    Actinic keratoses    Anxiety    ASHD (arteriosclerotic heart disease) 1984   Atrial fibrillation (HCC)    Cancer ([place]) 1999   PROSTATE   CVA (cerebral vascular accident) ([place])    Diverticulosis    Dyslipidemia    Dysrhythmia    Personal history of colonic polyps    Past Surgical History:  Procedure Laterality Date   ABDOMINAL AORTIC ENDOVASCULAR STENT GRAFT N/A 12/11/2015   Procedure: ABDOMINAL AORTIC ENDOVASCULAR STENT GRAFT;  Surgeon: Serafina Mitchell, MD;  Location: [facility] OR;  Service: Vascular;  Laterality: N/A;   CORONARY ARTERY BYPASS GRAFT  1984   [facility]- Dr. Redmond Pulling x4   EYE SURGERY Bilateral    cataracts   ODONTOID FRACTURE SURGERY     Odontoid screw fixation of type 2 odontoid fracture, open reduction and internal fixation of the fracture--patient had fallen down the stairs at 85 years old   OTHER SURGICAL HISTORY  01/29/1998   Stage TIC adenocarcinoma of the prostate--Surgeon--David, Grapey M.D.--Interstital seed implantation with I-125 seeds--PSA of approximately 7   PROSTATE SURGERY  approx 2003   [facility] -Grapey   TONSILLECTOMY     SOCIAL HISTORY  reports that he has never smoked. He has never used smokeless tobacco. He reports that he does not drink alcohol or use drugs.  Allergies  Allergen Reactions   Asa Buff (Mag [Buffered Aspirin] Nausea Only   Zetia [Ezetimibe] Other (See Comments)    Can not remember reaction   Family History  Problem Relation Age of Onset   Arthritis Mother  Heart disease Father    AAA (abdominal aortic aneurysm) Brother    Prior to Admission medications   Medication Sig Start Date End Date Taking? Authorizing Provider  metoprolol tartrate (LOPRESSOR) 25 MG tablet Take 0.5 tablets (12.5 mg total) by mouth 2 (two) times daily. 01/02/19   [name], [name], MD  pantoprazole (PROTONIX) 40 MG tablet Take 1 tablet (40 mg total) by mouth 2 (two) times daily. 01/08/19 05/08/19  Oswald Hillock, MD    rosuvastatin (CRESTOR) 10 MG tablet TAKE 1 TABLET DAILY Patient taking differently: Take 10 mg by mouth every other day.  12/19/18   [name], [name], MD  warfarin (COUMADIN) 2.5 MG tablet Take 1 tablet (2.5 mg total) by mouth daily. Patient taking differently: Take 1.25-2.5 mg by mouth daily. Take 2.5 mg by mouth on Tuesday, Wednesday, Friday, Saturday and Sunday. Take 1.25mg  by mouth on Monday and Thursday. 12/19/18   [name], Dani Gobble, MD   Physical Exam: Vitals:   01/16/19 1630 01/16/19 1700 01/16/19 1730 01/16/19 1800  BP: (!) 107/58 (!) 112/56 (!) 100/58 (!) 122/59  Pulse: 67 68 69 67  Resp: (!) 25 (!) 24 (!) 24 (!) 24  Temp:      TempSrc:      SpO2: 93% 92% 96% 96%  Weight:      Height:       Constitutional: Thin Elderly Caucasian male in NAD and appears calm and comfortable Eyes: Lids and conjunctivae normal, sclerae anicteric  ENMT: External Ears, Nose appear normal. Slightly Hard of Hearing Neck: Appears normal, supple, no cervical masses, normal ROM, no appreciable thyromegaly; mild JVD Respiratory: Diminished  to auscultation bilaterally but worse on the left compared to the right with some crackles and rhonchi; no wheezing, rales. Normal respiratory effort and patient is not tachypenic. No accessory muscle use. Wearing 2 Liters of Supplemental O2 via [place] Cardiovascular: Irregularly Irregular, no murmurs / rubs / gallops. S1 and S2 auscultated. 1+ LE extremity edema. Abdomen: Soft, non-tender, non-distended. No masses palpated. No appreciable hepatosplenomegaly. Bowel sounds positive x4.  GU: Deferred. Musculoskeletal: No clubbing / cyanosis of digits/nails. No joint deformity upper and lower extremities.  Skin: No rashes, lesions, ulcers on a limited skin evaluation but has LE Skin changes consistent with venous stasis. No induration; Warm and dry.  Neurologic: CN 2-12 grossly intact with no focal deficits. Romberg sign and cerebellar reflexes not assessed.  Psychiatric: Normal  judgment and insight. Alert and oriented x 3. Normal mood and appropriate affect.   Labs on Admission: I have personally reviewed following labs and imaging studies  CBC: Recent Labs  Lab 01/16/19 1550  WBC 7.2  NEUTROABS 5.8  HGB 8.3*  HCT 28.6*  MCV 96.6  PLT 053   Basic Metabolic Panel: Recent Labs  Lab 01/16/19 1550  NA 141  K 4.3  CL 109  CO2 25  GLUCOSE 109*  BUN 21  CREATININE 1.12  CALCIUM 8.5*   GFR: Estimated Creatinine Clearance: 33.7 mL/min (by C-G formula based on SCr of 1.12 mg/dL). Liver Function Tests: No results for input(s): AST, ALT, ALKPHOS, BILITOT, PROT, ALBUMIN in the last 168 hours. No results for input(s): LIPASE, AMYLASE in the last 168 hours. No results for input(s): AMMONIA in the last 168 hours. Coagulation Profile: Recent Labs  Lab 01/12/19  INR 3.0   Cardiac Enzymes: Recent Labs  Lab 01/16/19 1550  TROPONINI 0.05*   BNP (last 3 results) No results for input(s): PROBNP in the last 8760 hours. HbA1C: No results for  input(s): HGBA1C in the last 72 hours. CBG: No results for input(s): GLUCAP in the last 168 hours. Lipid Profile: No results for input(s): CHOL, HDL, LDLCALC, TRIG, CHOLHDL, LDLDIRECT in the last 72 hours. Thyroid Function Tests: No results for input(s): TSH, T4TOTAL, FREET4, T3FREE, THYROIDAB in the last 72 hours. Anemia Panel: No results for input(s): VITAMINB12, FOLATE, FERRITIN, TIBC, IRON, RETICCTPCT in the last 72 hours. Urine analysis:    Component Value Date/Time   COLORURINE YELLOW 01/05/2019 1045   APPEARANCEUR CLEAR 01/05/2019 1045   LABSPEC >1.030 (H) 01/05/2019 1045   PHURINE 5.5 01/05/2019 [place] 01/05/2019 [place] 01/05/2019 [place] 01/05/2019 [place] 01/05/2019 [place] 01/05/2019 1045   UROBILINOGEN 0.2 10/27/2009 1249   NITRITE NEGATIVE 01/05/2019 [place] 01/05/2019 1045   Sepsis  Labs: !!!!!!!!!!!!!!!!!!!!!!!!!!!!!!!!!!!!!!!!!!!! @LABRCNTIP (procalcitonin:4,lacticidven:4) ) Recent Results (from the past 240 hour(s))  SARS Coronavirus 2 (CEPHEID - Performed in [place] hospital lab), Hosp Order     Status: None   Collection Time: 01/16/19  3:22 PM  Result Value Ref Range Status   SARS Coronavirus 2 NEGATIVE NEGATIVE Final    Comment: (NOTE) If result is NEGATIVE SARS-CoV-2 target nucleic acids are NOT DETECTED. The SARS-CoV-2 RNA is generally detectable in upper and lower  respiratory specimens during the acute phase of infection. The lowest  concentration of SARS-CoV-2 viral copies this assay can detect is 250  copies / mL. A negative result does not preclude SARS-CoV-2 infection  and should not be used as the sole basis for treatment or other  patient management decisions.  A negative result may occur with  improper specimen collection / handling, submission of specimen other  than nasopharyngeal swab, presence of viral mutation(s) within the  areas targeted by this assay, and inadequate number of viral copies  (<250 copies / mL). A negative result must be combined with clinical  observations, patient history, and epidemiological information. If result is POSITIVE SARS-CoV-2 target nucleic acids are DETECTED. The SARS-CoV-2 RNA is generally detectable in upper and lower  respiratory specimens dur ing the acute phase of infection.  Positive  results are indicative of active infection with SARS-CoV-2.  Clinical  correlation with patient history and other diagnostic information is  necessary to determine patient infection status.  Positive results do  not rule out bacterial infection or co-infection with other viruses. If result is PRESUMPTIVE POSTIVE SARS-CoV-2 nucleic acids MAY BE PRESENT.   A presumptive positive result was obtained on the submitted specimen  and confirmed on repeat testing.  While 2019 novel coronavirus  (SARS-CoV-2) nucleic acids may  be present in the submitted sample  additional confirmatory testing may be necessary for epidemiological  and / or clinical management purposes  to differentiate between  SARS-CoV-2 and other Sarbecovirus currently known to infect humans.  If clinically indicated additional testing with an alternate test  methodology [phone]) is advised. The SARS-CoV-2 RNA is generally  detectable in upper and lower respiratory sp ecimens during the acute  phase of infection. The expected result is Negative. Fact Sheet for Patients:  StrictlyIdeas.no Fact Sheet for Healthcare Providers: BankingDealers.co.za This test is not yet approved or cleared by the [place] FDA and has been authorized for detection and/or diagnosis of SARS-CoV-2 by FDA under an Emergency Use Authorization (EUA).  This EUA will remain in effect (meaning this test can be used) for the duration of the COVID-19 declaration  under Section 564(b)(1) of the Act, 21 U.S.C. section 360bbb-3(b)(1), unless the authorization is terminated or revoked sooner. Performed at [facility], [place] [address] [place], [place] [address]      Radiological Exams on Admission: Dg Chest Port 1 View  Result Date: 01/16/2019 CLINICAL DATA:  Low oxygen saturation.  Shortness of breath. EXAM: PORTABLE CHEST 1 VIEW COMPARISON:  01/05/2019.  09/02/2017. FINDINGS: Previous median sternotomy. The patient has a background pattern mild chronic pulmonary scarring at the bases. There is diffuse pulmonary hazy opacity which has worsened further since the study of 11 days ago, consistent with pneumonia. There is some volume loss and consolidation in the left lower lobe. There may be some pleural fluid on left. Findings are presumed secondary to pneumonia, though there could be an element of fluid overload/edema. IMPRESSION: Continued worsening of diffuse pulmonary density. Left lower lobe density and  probable left effusion. Findings could relate to a combination of pneumonia and heart failure. Electronically Signed   By: Nelson Chimes M.D.   On: 01/16/2019 15:49   EKG: Independently reviewed.  Atrial fibrillation at a rate of 73 with a QTC of 449  Assessment/Plan Active Problems:   Atrial fibrillation, permanent   History of prostate cancer   Hyperlipidemia   CAD - S/P CABG in 1984   HTN (hypertension)   Chronic anticoagulation -Warfarin   Personal history of colonic polyps   Dyslipidemia   Severe anemia   Acute respiratory failure with hypoxia (HCC)   HCAP (healthcare-associated pneumonia)  Acute Respiratory Failure with Hypoxia in the setting of HCAP pneumonia with concomitant Suspected Acute CHF Exacerbation Unknown Type -Patient was saturating in the 70s on room air at home; initially is thought that he was having symptomatic anemia however hemoglobin/hematocrit is stable from last admission and is now 8.3/28.6 -Brought to the emergency room and placed on 2 L supplemental oxygen via nasal cannula and oxygen saturations maintained in the 90s -Pulse oximetry and maintain O2 saturations greater than 92% -Continue supplemental oxygen via nasal cannula and wean as tolerated -BNP on Admission was 494.8 and down from previous; patient was given IV Lasix in the ED -Broad-spectrum antibiotics with H CAP coverage will continue -We will need ambulatory home O2 screen prior to discharge -CXR on admission showed "Previous median sternotomy. The patient has a background pattern mild chronic pulmonary scarring at the bases. There is diffuse pulmonary hazy opacity which has worsened further since the study of 11 days ago, consistent with pneumonia. There is some volume loss and consolidation in the left lower lobe. There may be some pleural fluid on left. Findings are presumed secondary to pneumonia, though there could be an element of fluid overload/edema" -SARS-CoV-2 test is Negative this  Admission; Las one 11 days ago was Negative  -Repeat CXR in a.m.  Elevated BNP with Concern for Acute CHF Exacerbation -Last ECHOCardigoram was done in 2006 and I do not have access to these records -Patient does have Dyspnea on Exertion -BNP was 494.8 and last visit was 563.8 -Check ECHOCARDIOGRAM this Visit -Strict I's and O's, Daily Weights -Continue to Monitor for S/Sx of Volume Overload  -We will have cardiology evaluate for further evaluation recommendations  Elevated Troponin -Mild at 0.05 and likely Demand Ischemia  -EKG showed atrial fibrillation -We will have cardiology evaluate for further evaluation recommendations  Normocytic Anemia -Patient's hemoglobin/hematocrit is stable from last admission to discharge and is 8.3/28.6 -Check anemia panel in the a.m. -Continue monitor for signs and symptoms  of bleeding patient is anticoagulated warfarin -Recently had some GI bleeding/symptomatic anemia so we will continue with Protonix 40 mg p.o. twice daily -Repeat CBC in a.m.  Permanent Atrial Fibrillation -Patient was atrial fibrillation on admission rates in the 70s -Continue with home metoprolol and home anticoagulation with warfarin with pharmacy to dose -Checking echocardiogram for his dyspnea  Hyperlipidemia/Dyslipidemia -Continue with home statin  Hypeglycemia -Patient's blood sugar on admission was 109 -Questionable is reactive -Check hemoglobin A1c in the AM  AAA -Will need to follow up on U/S as outpatient  DVT prophylaxis: Anticoagulated with Warfarin Code Status: FULL CODE Family Communication: No Family present at bedside  Disposition Plan: Remain inpatient for treatment for pneumonia and CHF Consults called: Notified Nurse, learning disability via epic but will need to confirm if cardiology received a consult in a.m. Admission status: Inpatient medical telemetry  Severity of Illness: The appropriate patient status for this patient is INPATIENT. Inpatient status is  judged to be reasonable and necessary in order to provide the required intensity of service to ensure the patient's safety. The patient's presenting symptoms, physical exam findings, and initial radiographic and laboratory data in the context of their chronic comorbidities is felt to place them at high risk for further clinical deterioration. Furthermore, it is not anticipated that the patient will be medically stable for discharge from the hospital within 2 midnights of admission. The following factors support the patient status of inpatient.   " The patient's presenting symptoms include Hypoxia. " The worrisome physical exam findings include Crackles and Rhonic. " The initial radiographic and laboratory data are worrisome because of Worsening Pneumonia. " The chronic co-morbidities are listed above in the HPI.  * I certify that at the point of admission it is my clinical judgment that the patient will require inpatient hospital care spanning beyond 2 midnights from the point of admission due to high intensity of service, high risk for further deterioration and high frequency of surveillance required.Kerney Elbe, D.O. [facility] PAGER is on [place]  If 7PM-7AM, please contact night-coverage [email or web address] Password [facility]  01/16/2019, 6:56 PM

## 2019-01-16 NOTE — ED Notes (Signed)
Date and time results received: 01/16/19 4:35 PM  (use smartphrase ".now" to insert current time)  Test: Trop Critical Value: 0.05  Name of Provider Notified: Tyrone Nine  Orders Received? Or Actions Taken?: awaiting orders

## 2019-01-16 NOTE — ED Provider Notes (Signed)
[facility] DEPT Provider Note   CSN: [id number] Arrival date & time: 01/16/19  1500    History   Chief Complaint Chief Complaint  Patient presents with  . Respiratory Distress    HPI Gary Valencia is a 85 y.o. male.     85 yo M with a chief complaint of hypoxia.  Patient had home health nurse come out today and realized that his oxygen saturation was in the 70s.  He was then sent to the emergency department.  Oxygen on arrival was in the mid 80s was placed on 2 L without issue.  Patient was recently in the hospital with pneumonia and symptomatic anemia.  Patient denies any continued rectal bleeding.  He actually denies any complaints.  States he has been working out in the yard as he typically does.  Denies any chest pain or pressure denies shortness of breath cough congestion.  Denies abdominal pain vomiting or diarrhea.  The history is provided by the patient.  Illness  Severity:  Moderate Onset quality:  Sudden Duration:  1 day Timing:  Constant Progression:  Unchanged Chronicity:  New Associated symptoms: no abdominal pain, no chest pain, no congestion, no diarrhea, no fever, no headaches, no myalgias, no rash, no shortness of breath and no vomiting     Past Medical History:  Diagnosis Date  . AAA (abdominal aortic aneurysm) ([place])   . Actinic keratoses   . Anxiety   . ASHD (arteriosclerotic heart disease) 1984  . Atrial fibrillation ([place])   . Cancer ([place]) 1999   PROSTATE  . CVA (cerebral vascular accident) ([place])   . Diverticulosis   . Dyslipidemia   . Dysrhythmia   . Personal history of colonic polyps     Patient Active Problem List   Diagnosis Date Noted  . Acute respiratory failure with hypoxia ([place]) 01/16/2019  . Pressure injury of skin 01/08/2019  . Severe anemia 01/05/2019  . Anemia 01/05/2019  . Herpes labialis 09/08/2017  . Personal history of colonic polyps   . Dysrhythmia   . Dyslipidemia   . Diverticulosis    . Atrial fibrillation ([place])   . Anxiety   . Actinic keratoses   . AAA (abdominal aortic aneurysm) without rupture ([place]) 12/11/2015  . CAD - S/P CABG in 1984 09/13/2012  . HTN (hypertension) 09/13/2012  . Chronic anticoagulation -Warfarin 09/13/2012  . Palpitations 09/13/2012  . Atrial fibrillation, permanent 10/11/2011  . ASHD (arteriosclerotic heart disease) 10/11/2011  . History of prostate cancer 10/11/2011  . AAA (4.09 cm on last [place] -2013) 10/11/2011  . Hyperlipidemia 10/11/2011  . Cancer ([place]) 08/23/1997    Past Surgical History:  Procedure Laterality Date  . ABDOMINAL AORTIC ENDOVASCULAR STENT GRAFT N/A 12/11/2015   Procedure: ABDOMINAL AORTIC ENDOVASCULAR STENT GRAFT;  Surgeon: Serafina Mitchell, MD;  Location: [facility];  Service: Vascular;  Laterality: N/A;  . CORONARY ARTERY BYPASS GRAFT  1984   [facility]- Dr. Redmond Pulling x4  . EYE SURGERY Bilateral    cataracts  . ODONTOID FRACTURE SURGERY     Odontoid screw fixation of type 2 odontoid fracture, open reduction and internal fixation of the fracture--patient had fallen down the stairs at 85 years old  . OTHER SURGICAL HISTORY  01/29/1998   Stage TIC adenocarcinoma of the prostate--Surgeon--David, Grapey M.D.--Interstital seed implantation with I-125 seeds--PSA of approximately 7  . PROSTATE SURGERY  approx 2003   [facility] -Grapey  . TONSILLECTOMY          Home Medications  Prior to Admission medications   Medication Sig Start Date End Date Taking? Authorizing Provider  metoprolol tartrate (LOPRESSOR) 25 MG tablet Take 0.5 tablets (12.5 mg total) by mouth 2 (two) times daily. 01/02/19   [name], [name], MD  pantoprazole (PROTONIX) 40 MG tablet Take 1 tablet (40 mg total) by mouth 2 (two) times daily. 01/08/19 05/08/19  Oswald Hillock, MD  rosuvastatin (CRESTOR) 10 MG tablet TAKE 1 TABLET DAILY Patient taking differently: Take 10 mg by mouth every other day.  12/19/18   [name], [name], MD  warfarin (COUMADIN) 2.5 MG tablet Take 1  tablet (2.5 mg total) by mouth daily. Patient taking differently: Take 1.25-2.5 mg by mouth daily. Take 2.5 mg by mouth on Tuesday, Wednesday, Friday, Saturday and Sunday. Take 1.25mg  by mouth on Monday and Thursday. 12/19/18   [name], Dani Gobble, MD    Family History Family History  Problem Relation Age of Onset  . Arthritis Mother   . Heart disease Father   . AAA (abdominal aortic aneurysm) Brother     Social History Social History   Tobacco Use  . Smoking status: Never Smoker  . Smokeless tobacco: Never Used  Substance Use Topics  . Alcohol use: No  . Drug use: No     Allergies   Asa buff (mag [buffered aspirin] and Zetia [ezetimibe]   Review of Systems Review of Systems  Constitutional: Negative for chills and fever.  HENT: Negative for congestion and facial swelling.   Eyes: Negative for discharge and visual disturbance.  Respiratory: Negative for shortness of breath.   Cardiovascular: Negative for chest pain and palpitations.  Gastrointestinal: Negative for abdominal pain, diarrhea and vomiting.  Musculoskeletal: Negative for arthralgias and myalgias.  Skin: Negative for color change and rash.  Neurological: Negative for tremors, syncope and headaches.  Psychiatric/Behavioral: Negative for confusion and dysphoric mood.     Physical Exam Updated Vital Signs BP (!) 100/58   Pulse 69   Temp 97.6 F (36.4 C) (Oral)   Resp (!) 24   Ht 5\' 7"  (1.702 m)   Wt 59 kg   SpO2 96%   BMI 20.36 kg/m   Physical Exam Vitals signs and nursing note reviewed.  Constitutional:      Appearance: He is well-developed.  HENT:     Head: Normocephalic and atraumatic.  Eyes:     Pupils: Pupils are equal, round, and reactive to light.  Neck:     Musculoskeletal: Normal range of motion and neck supple.     Vascular: No JVD.  Cardiovascular:     Rate and Rhythm: Normal rate and regular rhythm.     Heart sounds: No murmur. No friction rub. No gallop.   Pulmonary:     Effort:  No respiratory distress.     Breath sounds: Rhonchi (scattered) present. No wheezing.  Abdominal:     General: There is no distension.     Tenderness: There is no abdominal tenderness. There is no guarding or rebound.  Musculoskeletal: Normal range of motion.  Skin:    Coloration: Skin is not pale.     Findings: No rash.  Neurological:     Mental Status: He is alert and oriented to person, place, and time.  Psychiatric:        Behavior: Behavior normal.      ED Treatments / Results  Labs (all labs ordered are listed, but only abnormal results are displayed) Labs Reviewed  CBC WITH DIFFERENTIAL/PLATELET - Abnormal; Notable for the following components:  Result Value   RBC 2.96 (*)    Hemoglobin 8.3 (*)    HCT 28.6 (*)    MCHC 29.0 (*)    RDW 17.9 (*)    Lymphs Abs 0.6 (*)    All other components within normal limits  BASIC METABOLIC PANEL - Abnormal; Notable for the following components:   Glucose, Bld 109 (*)    Calcium 8.5 (*)    GFR calc non Af Amer 56 (*)    All other components within normal limits  TROPONIN I - Abnormal; Notable for the following components:   Troponin I 0.05 (*)    All other components within normal limits  BRAIN NATRIURETIC PEPTIDE - Abnormal; Notable for the following components:   B Natriuretic Peptide 494.8 (*)    All other components within normal limits  SARS CORONAVIRUS 2 (HOSPITAL ORDER, [facility])  CULTURE, BLOOD (ROUTINE X 2)  CULTURE, BLOOD (ROUTINE X 2)  LACTIC ACID, PLASMA  LACTIC ACID, PLASMA    EKG EKG Interpretation  Date/Time:  Tuesday Jan 16 2019 15:26:56 EDT Ventricular Rate:  73 PR Interval:    QRS Duration: 101 QT Interval:  407 QTC Calculation: 449 R Axis:   0 Text Interpretation:  Atrial fibrillation Abnormal R-wave progression, late transition Borderline repolarization abnormality No significant change since last tracing Confirmed by Deno Etienne [phone]) on 01/16/2019 3:56:28 PM    Radiology Dg Chest Port 1 View  Result Date: 01/16/2019 CLINICAL DATA:  Low oxygen saturation.  Shortness of breath. EXAM: PORTABLE CHEST 1 VIEW COMPARISON:  01/05/2019.  09/02/2017. FINDINGS: Previous median sternotomy. The patient has a background pattern mild chronic pulmonary scarring at the bases. There is diffuse pulmonary hazy opacity which has worsened further since the study of 11 days ago, consistent with pneumonia. There is some volume loss and consolidation in the left lower lobe. There may be some pleural fluid on left. Findings are presumed secondary to pneumonia, though there could be an element of fluid overload/edema. IMPRESSION: Continued worsening of diffuse pulmonary density. Left lower lobe density and probable left effusion. Findings could relate to a combination of pneumonia and heart failure. Electronically Signed   By: Nelson Chimes M.D.   On: 01/16/2019 15:49    Procedures Procedures (including critical care time)  Medications Ordered in ED Medications  vancomycin (VANCOCIN) 1,250 mg in sodium chloride 0.9 % 250 mL IVPB (1,250 mg Intravenous New Bag/Given 01/16/19 1721)  furosemide (LASIX) injection 20 mg (has no administration in time range)  piperacillin-tazobactam (ZOSYN) IVPB 3.375 g (0 g Intravenous Stopped 01/16/19 1721)     Initial Impression / Assessment and Plan / ED Course  I have reviewed the triage vital signs and the nursing notes.  Pertinent labs & imaging results that were available during my care of the patient were reviewed by me and considered in my medical decision making (see chart for details).        85 yo M recently in the hospital for pneumonia and symptomatic anemia comes in with a chief complaint of hypoxia.  The patient denies any complaint currently.  Denied any complaint at home.  Home health nurse checked his oxygen saturation and it was in the 70s.  Will obtain a laboratory evaluation chest x-ray.  Chest x-ray read concerning for  worsening pneumonia.  Will broaden his antibiotics as he was just in the hospital.  Blood cultures lactate.   CRITICAL CARE Performed by: Cecilio Asper   Total critical care  time: 35 minutes  Critical care time was exclusive of separately billable procedures and treating other patients.  Critical care was necessary to treat or prevent imminent or life-threatening deterioration.  Critical care was time spent personally by me on the following activities: development of treatment plan with patient and/or surrogate as well as nursing, discussions with consultants, evaluation of patient's response to treatment, examination of patient, obtaining history from patient or surrogate, ordering and performing treatments and interventions, ordering and review of laboratory studies, ordering and review of radiographic studies, pulse oximetry and re-evaluation of patient's condition.  The patients results and plan were reviewed and discussed.   Any x-rays performed were independently reviewed by myself.   Differential diagnosis were considered with the presenting HPI.  Medications  vancomycin (VANCOCIN) 1,250 mg in sodium chloride 0.9 % 250 mL IVPB (1,250 mg Intravenous New Bag/Given 01/16/19 1721)  furosemide (LASIX) injection 20 mg (has no administration in time range)  piperacillin-tazobactam (ZOSYN) IVPB 3.375 g (0 g Intravenous Stopped 01/16/19 1721)    Vitals:   01/16/19 1600 01/16/19 1630 01/16/19 1700 01/16/19 1730  BP: (!) 103/53 (!) 107/58 (!) 112/56 (!) 100/58  Pulse: 69 67 68 69  Resp: (!) 27 (!) 25 (!) 24 (!) 24  Temp:      TempSrc:      SpO2: 95% 93% 92% 96%  Weight:      Height:        Final diagnoses:  Acute respiratory failure with hypoxia (HCC)  HCAP (healthcare-associated pneumonia)    Admission/ observation were discussed with the admitting physician, patient and/or family and they are comfortable with the plan.    Final Clinical Impressions(s) / ED Diagnoses    Final diagnoses:  Acute respiratory failure with hypoxia ([place])  HCAP (healthcare-associated pneumonia)    ED Discharge Orders    None       Deno Etienne, DO 01/16/19 1748

## 2019-01-16 NOTE — ED Notes (Signed)
Pt 88% RA 96% 2L nasal cannula

## 2019-01-16 NOTE — ED Notes (Signed)
ED TO INPATIENT HANDOFF REPORT  ED Nurse Name and Phone #:   S Name/Age/Gender Gary Valencia 85 y.o. male Room/Bed: WA05/WA05  Code Status   Code Status: Prior  Home/SNF/Other Home Patient oriented to: self and place Is this baseline? Yes   Triage Complete: Triage complete  Chief Complaint Low O2  Triage Note Pt BIB EMS from home. Home health nurse reports 78% RA. Pt denies SOB. Pt has hx anemia. Pt reports slight SOB with activity and this is his normal. Pt alert and oriented x4.    Allergies Allergies  Allergen Reactions  . Asa Buff (Mag [Buffered Aspirin] Nausea Only  . Zetia [Ezetimibe] Other (See Comments)    Can not remember reaction    Level of Care/Admitting Diagnosis ED Disposition    ED Disposition Condition Comment   Admit  Hospital Area: [facility] [[id number]]  Level of Care: Telemetry [5]  Admit to tele based on following criteria: Monitor QTC interval  Admit to tele based on following criteria: Acute CHF  Covid Evaluation: N/A  Diagnosis: Acute respiratory failure with hypoxia [facility]) [[id number]]  Admitting Physician: [place], [place] [[id number]]  Attending Physician: Raiford Noble LATIF [[id number]]  PT Class (Do Not Modify): Observation [104]  PT Acc Code (Do Not Modify): Observation [10022]       B Medical/Surgery History Past Medical History:  Diagnosis Date  . AAA (abdominal aortic aneurysm) ([place])   . Actinic keratoses   . Anxiety   . ASHD (arteriosclerotic heart disease) 1984  . Atrial fibrillation ([place])   . Cancer ([place]) 1999   PROSTATE  . CVA (cerebral vascular accident) ([place])   . Diverticulosis   . Dyslipidemia   . Dysrhythmia   . Personal history of colonic polyps    Past Surgical History:  Procedure Laterality Date  . ABDOMINAL AORTIC ENDOVASCULAR STENT GRAFT N/A 12/11/2015   Procedure: ABDOMINAL AORTIC ENDOVASCULAR STENT GRAFT;  Surgeon: Serafina Mitchell, MD;  Location: [facility];  Service: Vascular;  Laterality:  N/A;  . CORONARY ARTERY BYPASS GRAFT  1984   [facility]- Dr. Redmond Pulling x4  . EYE SURGERY Bilateral    cataracts  . ODONTOID FRACTURE SURGERY     Odontoid screw fixation of type 2 odontoid fracture, open reduction and internal fixation of the fracture--patient had fallen down the stairs at 85 years old  . OTHER SURGICAL HISTORY  01/29/1998   Stage TIC adenocarcinoma of the prostate--Surgeon--David, Grapey M.D.--Interstital seed implantation with I-125 seeds--PSA of approximately 7  . PROSTATE SURGERY  approx 2003    -Grapey  . TONSILLECTOMY       A IV Location/Drains/Wounds Patient Lines/Drains/Airways Status   Active Line/Drains/Airways    Name:   Placement date:   Placement time:   Site:   Days:   Peripheral IV 01/16/19 Left Antecubital   01/16/19    -    Antecubital   less than 1   External Urinary Catheter   01/08/19    0847    -   8   Incision (Closed) 12/11/15 Groin Left   12/11/15    0920     1132   Incision (Closed) 12/11/15 Groin Right   12/11/15    0920     1132   Pressure Injury 01/07/19 Stage I -  Intact skin with non-blanchable redness of a localized area usually over a bony prominence. reddened area over boney prominance; Allevyn intact   01/07/19    1145     9  Intake/Output Last 24 hours No intake or output data in the 24 hours ending 01/16/19 1824  Labs/Imaging Results for orders placed or performed during the hospital encounter of 01/16/19 (from the past 48 hour(s))  SARS Coronavirus 2 (CEPHEID - Performed in [facility] hospital lab), Hosp Order     Status: None   Collection Time: 01/16/19  3:22 PM  Result Value Ref Range   SARS Coronavirus 2 NEGATIVE NEGATIVE    Comment: (NOTE) If result is NEGATIVE SARS-CoV-2 target nucleic acids are NOT DETECTED. The SARS-CoV-2 RNA is generally detectable in upper and lower  respiratory specimens during the acute phase of infection. The lowest  concentration of SARS-CoV-2 viral copies this assay can detect is  250  copies / mL. A negative result does not preclude SARS-CoV-2 infection  and should not be used as the sole basis for treatment or other  patient management decisions.  A negative result may occur with  improper specimen collection / handling, submission of specimen other  than nasopharyngeal swab, presence of viral mutation(s) within the  areas targeted by this assay, and inadequate number of viral copies  (<250 copies / mL). A negative result must be combined with clinical  observations, patient history, and epidemiological information. If result is POSITIVE SARS-CoV-2 target nucleic acids are DETECTED. The SARS-CoV-2 RNA is generally detectable in upper and lower  respiratory specimens dur ing the acute phase of infection.  Positive  results are indicative of active infection with SARS-CoV-2.  Clinical  correlation with patient history and other diagnostic information is  necessary to determine patient infection status.  Positive results do  not rule out bacterial infection or co-infection with other viruses. If result is PRESUMPTIVE POSTIVE SARS-CoV-2 nucleic acids MAY BE PRESENT.   A presumptive positive result was obtained on the submitted specimen  and confirmed on repeat testing.  While 2019 novel coronavirus  (SARS-CoV-2) nucleic acids may be present in the submitted sample  additional confirmatory testing may be necessary for epidemiological  and / or clinical management purposes  to differentiate between  SARS-CoV-2 and other Sarbecovirus currently known to infect humans.  If clinically indicated additional testing with an alternate test  methodology [phone]) is advised. The SARS-CoV-2 RNA is generally  detectable in upper and lower respiratory sp ecimens during the acute  phase of infection. The expected result is Negative. Fact Sheet for Patients:  StrictlyIdeas.no Fact Sheet for Healthcare  Providers: BankingDealers.co.za This test is not yet approved or cleared by the [place] FDA and has been authorized for detection and/or diagnosis of SARS-CoV-2 by FDA under an Emergency Use Authorization (EUA).  This EUA will remain in effect (meaning this test can be used) for the duration of the COVID-19 declaration under Section 564(b)(1) of the Act, 21 U.S.C. section 360bbb-3(b)(1), unless the authorization is terminated or revoked sooner. Performed at [facility], [place] [address], [place], [place] [address]   CBC with Differential     Status: Abnormal   Collection Time: 01/16/19  3:50 PM  Result Value Ref Range   WBC 7.2 4.0 - 10.5 K/uL   RBC 2.96 (L) 4.22 - 5.81 MIL/uL   Hemoglobin 8.3 (L) 13.0 - 17.0 g/dL   HCT 28.6 (L) 39.0 - 52.0 %   MCV 96.6 80.0 - 100.0 fL   MCH 28.0 26.0 - 34.0 pg   MCHC 29.0 (L) 30.0 - 36.0 g/dL   RDW 17.9 (H) 11.5 - 15.5 %   Platelets 374 150 - 400  K/uL   nRBC 0.0 0.0 - 0.2 %   Neutrophils Relative % 79 %   Neutro Abs 5.8 1.7 - 7.7 K/uL   Lymphocytes Relative 8 %   Lymphs Abs 0.6 (L) 0.7 - 4.0 K/uL   Monocytes Relative 6 %   Monocytes Absolute 0.4 0.1 - 1.0 K/uL   Eosinophils Relative 5 %   Eosinophils Absolute 0.3 0.0 - 0.5 K/uL   Basophils Relative 1 %   Basophils Absolute 0.0 0.0 - 0.1 K/uL   Immature Granulocytes 1 %   Abs Immature Granulocytes 0.04 0.00 - 0.07 K/uL    Comment: Performed at [facility], [place] [address], [place], [place] [address]  Basic metabolic panel     Status: Abnormal   Collection Time: 01/16/19  3:50 PM  Result Value Ref Range   Sodium 141 135 - 145 mmol/L   Potassium 4.3 3.5 - 5.1 mmol/L   Chloride 109 98 - 111 mmol/L   CO2 25 22 - 32 mmol/L   Glucose, Bld 109 (H) 70 - 99 mg/dL   BUN 21 8 - 23 mg/dL   Creatinine, Ser 1.12 0.61 - 1.24 mg/dL   Calcium 8.5 (L) 8.9 - 10.3 mg/dL   GFR calc non Af Amer 56 (L) >60 mL/min   GFR calc Af Amer >60 >60  mL/min   Anion gap 7 5 - 15    Comment: Performed at [facility], [place] [address] [place], [place] [address]  Troponin I - ONCE - STAT     Status: Abnormal   Collection Time: 01/16/19  3:50 PM  Result Value Ref Range   Troponin I 0.05 (HH) <0.03 ng/mL    Comment: CRITICAL RESULT CALLED TO, READ BACK BY AND VERIFIED WITH: Seward Carol 01/16/19 1635 RHOLMES Performed at [facility], [place] [address], [place], [place] [address]   Brain natriuretic peptide     Status: Abnormal   Collection Time: 01/16/19  3:50 PM  Result Value Ref Range   B Natriuretic Peptide 494.8 (H) 0.0 - 100.0 pg/mL    Comment: Performed at [facility], [place] [address], [place], [place] [address]  Lactic acid, plasma     Status: None   Collection Time: 01/16/19  4:05 PM  Result Value Ref Range   Lactic Acid, Venous 1.2 0.5 - 1.9 mmol/L    Comment: Performed at [facility], [place] [address] [place], [place] [address]   Dg Chest Port 1 View  Result Date: 01/16/2019 CLINICAL DATA:  Low oxygen saturation.  Shortness of breath. EXAM: PORTABLE CHEST 1 VIEW COMPARISON:  01/05/2019.  09/02/2017. FINDINGS: Previous median sternotomy. The patient has a background pattern mild chronic pulmonary scarring at the bases. There is diffuse pulmonary hazy opacity which has worsened further since the study of 11 days ago, consistent with pneumonia. There is some volume loss and consolidation in the left lower lobe. There may be some pleural fluid on left. Findings are presumed secondary to pneumonia, though there could be an element of fluid overload/edema. IMPRESSION: Continued worsening of diffuse pulmonary density. Left lower lobe density and probable left effusion. Findings could relate to a combination of pneumonia and heart failure. Electronically Signed   By: Nelson Chimes M.D.   On: 01/16/2019 15:49    Pending Labs Unresulted Labs (From admission, onward)     Start     Ordered   01/16/19 1557  Lactic acid, plasma  Now then every 2  hours,   STAT     01/16/19 1556   01/16/19 1557  Blood culture (routine x 2)  BLOOD CULTURE X 2,   STAT     01/16/19 1556          Vitals/Pain Today's Vitals   01/16/19 1630 01/16/19 1700 01/16/19 1730 01/16/19 1800  BP: (!) 107/58 (!) 112/56 (!) 100/58 (!) 122/59  Pulse: 67 68 69 67  Resp: (!) 25 (!) 24 (!) 24 (!) 24  Temp:      TempSrc:      SpO2: 93% 92% 96% 96%  Weight:      Height:        Isolation Precautions No active isolations  Medications Medications  vancomycin (VANCOCIN) 1,250 mg in sodium chloride 0.9 % 250 mL IVPB (1,250 mg Intravenous New Bag/Given 01/16/19 1721)  piperacillin-tazobactam (ZOSYN) IVPB 3.375 g (0 g Intravenous Stopped 01/16/19 1721)  furosemide (LASIX) injection 20 mg (20 mg Intravenous Given 01/16/19 1813)    Mobility walks with device Moderate fall risk      R Recommendations: See Admitting Provider Note  Report given to:   Additional Notes:

## 2019-01-16 NOTE — ED Notes (Signed)
Bed: PC34 Expected date:  Expected time:  Means of arrival:  Comments: EMS-85yo low O2

## 2019-01-17 ENCOUNTER — Inpatient Hospital Stay (HOSPITAL_COMMUNITY): Payer: Medicare Other

## 2019-01-17 ENCOUNTER — Inpatient Hospital Stay: Payer: Medicare Other | Admitting: Family Medicine

## 2019-01-17 DIAGNOSIS — I361 Nonrheumatic tricuspid (valve) insufficiency: Secondary | ICD-10-CM

## 2019-01-17 DIAGNOSIS — R0602 Shortness of breath: Secondary | ICD-10-CM

## 2019-01-17 DIAGNOSIS — Z7901 Long term (current) use of anticoagulants: Secondary | ICD-10-CM

## 2019-01-17 DIAGNOSIS — J9601 Acute respiratory failure with hypoxia: Secondary | ICD-10-CM

## 2019-01-17 DIAGNOSIS — I482 Chronic atrial fibrillation, unspecified: Secondary | ICD-10-CM

## 2019-01-17 DIAGNOSIS — D649 Anemia, unspecified: Secondary | ICD-10-CM

## 2019-01-17 DIAGNOSIS — I2510 Atherosclerotic heart disease of native coronary artery without angina pectoris: Secondary | ICD-10-CM

## 2019-01-17 DIAGNOSIS — I5031 Acute diastolic (congestive) heart failure: Secondary | ICD-10-CM

## 2019-01-17 LAB — CBC WITH DIFFERENTIAL/PLATELET
Abs Immature Granulocytes: 0.01 10*3/uL (ref 0.00–0.07)
Basophils Absolute: 0 10*3/uL (ref 0.0–0.1)
Basophils Relative: 0 %
Eosinophils Absolute: 0.3 10*3/uL (ref 0.0–0.5)
Eosinophils Relative: 5 %
HCT: 27.3 % — ABNORMAL LOW (ref 39.0–52.0)
Hemoglobin: 7.7 g/dL — ABNORMAL LOW (ref 13.0–17.0)
Immature Granulocytes: 0 %
Lymphocytes Relative: 9 %
Lymphs Abs: 0.6 10*3/uL — ABNORMAL LOW (ref 0.7–4.0)
MCH: 27 pg (ref 26.0–34.0)
MCHC: 28.2 g/dL — ABNORMAL LOW (ref 30.0–36.0)
MCV: 95.8 fL (ref 80.0–100.0)
Monocytes Absolute: 0.3 10*3/uL (ref 0.1–1.0)
Monocytes Relative: 4 %
Neutro Abs: 5.4 10*3/uL (ref 1.7–7.7)
Neutrophils Relative %: 82 %
Platelets: 348 10*3/uL (ref 150–400)
RBC: 2.85 MIL/uL — ABNORMAL LOW (ref 4.22–5.81)
RDW: 17.8 % — ABNORMAL HIGH (ref 11.5–15.5)
WBC: 6.7 10*3/uL (ref 4.0–10.5)
nRBC: 0 % (ref 0.0–0.2)

## 2019-01-17 LAB — HEMOGLOBIN A1C
Hgb A1c MFr Bld: 5.3 % (ref 4.8–5.6)
Mean Plasma Glucose: 105.41 mg/dL

## 2019-01-17 LAB — GLUCOSE, CAPILLARY
Glucose-Capillary: 65 mg/dL — ABNORMAL LOW (ref 70–99)
Glucose-Capillary: 72 mg/dL (ref 70–99)

## 2019-01-17 LAB — PROTIME-INR
INR: 6.4 (ref 0.8–1.2)
Prothrombin Time: 55.1 seconds — ABNORMAL HIGH (ref 11.4–15.2)

## 2019-01-17 LAB — RETICULOCYTES
Immature Retic Fract: 28.1 % — ABNORMAL HIGH (ref 2.3–15.9)
RBC.: 2.85 MIL/uL — ABNORMAL LOW (ref 4.22–5.81)
Retic Count, Absolute: 53.6 10*3/uL (ref 19.0–186.0)
Retic Ct Pct: 1.9 % (ref 0.4–3.1)

## 2019-01-17 LAB — COMPREHENSIVE METABOLIC PANEL
ALT: 20 U/L (ref 0–44)
AST: 39 U/L (ref 15–41)
Albumin: 2.3 g/dL — ABNORMAL LOW (ref 3.5–5.0)
Alkaline Phosphatase: 126 U/L (ref 38–126)
Anion gap: 6 (ref 5–15)
BUN: 19 mg/dL (ref 8–23)
CO2: 26 mmol/L (ref 22–32)
Calcium: 8.1 mg/dL — ABNORMAL LOW (ref 8.9–10.3)
Chloride: 108 mmol/L (ref 98–111)
Creatinine, Ser: 1.1 mg/dL (ref 0.61–1.24)
GFR calc Af Amer: >60 mL/min (ref >60)
GFR calc non Af Amer: 57 mL/min — ABNORMAL LOW (ref >60)
Glucose, Bld: 95 mg/dL (ref 70–99)
Potassium: 4.3 mmol/L (ref 3.5–5.1)
Sodium: 140 mmol/L (ref 135–145)
Total Bilirubin: 0.7 mg/dL (ref 0.3–1.2)
Total Protein: 6.6 g/dL (ref 6.5–8.1)

## 2019-01-17 LAB — IRON AND TIBC
Iron: 20 ug/dL — ABNORMAL LOW (ref 45–182)
Saturation Ratios: 8 % — ABNORMAL LOW (ref 17.9–39.5)
TIBC: 240 ug/dL — ABNORMAL LOW (ref 250–450)
UIBC: 220 ug/dL

## 2019-01-17 LAB — VITAMIN B12: Vitamin B-12: 555 pg/mL (ref 180–914)

## 2019-01-17 LAB — ECHOCARDIOGRAM COMPLETE
Height: 67 in
Weight: 2195.2 oz

## 2019-01-17 LAB — PREPARE RBC (CROSSMATCH)

## 2019-01-17 LAB — MAGNESIUM: Magnesium: 2.3 mg/dL (ref 1.7–2.4)

## 2019-01-17 LAB — STREP PNEUMONIAE URINARY ANTIGEN: Strep Pneumo Urinary Antigen: NEGATIVE

## 2019-01-17 LAB — FERRITIN: Ferritin: 89 ng/mL (ref 24–336)

## 2019-01-17 LAB — PHOSPHORUS: Phosphorus: 4.1 mg/dL (ref 2.5–4.6)

## 2019-01-17 LAB — MRSA PCR SCREENING: MRSA by PCR: NEGATIVE

## 2019-01-17 LAB — HIV ANTIBODY (ROUTINE TESTING W REFLEX): HIV Screen 4th Generation wRfx: NONREACTIVE

## 2019-01-17 LAB — FOLATE: Folate: 20.5 ng/mL (ref >5.9)

## 2019-01-17 MED ORDER — FUROSEMIDE 10 MG/ML IJ SOLN
40.0000 mg | Freq: Once | INTRAMUSCULAR | Status: AC
  Administered 2019-01-17: 40 mg via INTRAVENOUS
  Filled 2019-01-17: qty 4

## 2019-01-17 MED ORDER — SODIUM CHLORIDE 0.9% IV SOLUTION: Freq: Once | INTRAVENOUS | Status: DC

## 2019-01-17 MED ORDER — SODIUM CHLORIDE 0.9 % IV SOLN
510.0000 mg | Freq: Once | INTRAVENOUS | Status: AC
  Administered 2019-01-17: 510 mg via INTRAVENOUS
  Filled 2019-01-17: qty 17

## 2019-01-17 MED ORDER — WARFARIN - PHARMACIST DOSING INPATIENT: Freq: Every day | Status: DC

## 2019-01-17 MED ORDER — SODIUM CHLORIDE 0.9 % IV SOLN
3.0000 g | Freq: Four times a day (QID) | INTRAVENOUS | Status: DC
  Administered 2019-01-18 – 2019-01-19 (×6): 3 g via INTRAVENOUS
  Filled 2019-01-17 (×9): qty 3

## 2019-01-17 MED ORDER — RESOURCE THICKENUP CLEAR PO POWD
ORAL | Status: DC | PRN
  Filled 2019-01-17: qty 125

## 2019-01-17 NOTE — Progress Notes (Signed)
Pt ate Oatmeal and second applesauce container. Recheck CBG 72

## 2019-01-17 NOTE — Consult Note (Signed)
Cardiology Consultation:   Patient ID: Gary Valencia; [id number]; 1923/04/02   Admit date: 01/16/2019 Date of Consult: 01/17/2019  Primary Care Provider: [name], MD Primary Cardiologist: Sanda Klein, MD Primary Electrophysiologist:  None   Patient Profile:   Gary Valencia is a 85 y.o. male with a PMH of CAD s/p CABG in 1984, permanent atrial fibrillation on coumadin, HLD, AAA s/p repair 11/2015, ?recent CVA, and prostate cancer, who is being seen today for the evaluation of CHF at the request of Dr. Rodena Piety.  History of Present Illness:   [name] was seen by PCP 11/2018 at which time family was concerned for intermittent slurred speech. It was felt that this could have been 2/2 a CVA and the patient was empirically started on a baby aspirin. He was then admitted to the hospital from 01/05/2019-01/08/2019 for anemia at which time a CT Head did not show any recent CVA and ASA was discontinued. He was also treated for CAP during that hospitalization. He was reported to be doing well until 01/16/2019 when his HHA noticed his oxygen saturations were in the 70s on RA at home, though patient was asymptomatic. He was brought to the ED for further evaluation.   He was last seen by cardiology at an outpatient visit with [name] 11/2017, at which time he was without cardiac complaints and noted to be doing remarkably well for his age. No medication changes occurred at that time and he was recommended to follow-up in 1 year which has not occurred due to COVID-19 pandemic. He follows closely with the coumadin clinic for management of his anticoagulation for atrial fibrillation. His last echocardiogram 11/2015 showed EF 55-60%, no RWMA, indeterminate diastolic function, mild MR, moderate TR, and mild pulm HTN. His last ischemic evaluation appears to be a NST in 2012 which was without ischemia.   At the time of this evaluation he reports feeling well overall. He denied having SOB prior  to admission. He states he is fairly active at baseline and still mows his yard and tends to his garden. He reports experiencing some SOB when he stands up which resolves shortly after he begins walking. No complaints of chest pain, orthopnea, PND, LE edema, dizziness, lightheadedness, or syncope. He denies cough or fever. He does mention a history of herpes zoster with residual aggravating itching/discomfort at his left lower back region, for which he is asking if there are medications to improve his symptoms.   Hospital course: afebrile, intermittently tachypneic, O2 sats in the 90s on O2 via [place], BP stable. Labs notable for electrolytes wnl, Cr 1.1, albumin 2.3, WBC wnl, Hgb 8.3>7.7, PLT 348, INR 6.4, TSH wnl, B12 555, Iron low, BNP 494, Trop 0.05. COVID 19 negative. Urine strep/legionella pending. CXR with worsening diffuse pulmonary density, LLL density and probable effusion felt to be a combination of PNA and CHF. He was admitted to medicine and started on empiric antibiotics for HCAP, as well as gentle diuresis for possible CHF. Echo is pending. Cardiology asked to evaluate for CHF.   Past Medical History:  Diagnosis Date  . AAA (abdominal aortic aneurysm) ([place])   . Actinic keratoses   . Anxiety   . ASHD (arteriosclerotic heart disease) 1984  . Atrial fibrillation ([place])   . Cancer ([place]) 1999   PROSTATE  . CVA (cerebral vascular accident) ([place])   . Diverticulosis   . Dyslipidemia   . Dysrhythmia   . Personal history of colonic polyps     Past  Surgical History:  Procedure Laterality Date  . ABDOMINAL AORTIC ENDOVASCULAR STENT GRAFT N/A 12/11/2015   Procedure: ABDOMINAL AORTIC ENDOVASCULAR STENT GRAFT;  Surgeon: Serafina Mitchell, MD;  Location: [facility];  Service: Vascular;  Laterality: N/A;  . CORONARY ARTERY BYPASS GRAFT  1984   [facility]- Dr. Redmond Pulling x4  . EYE SURGERY Bilateral    cataracts  . ODONTOID FRACTURE SURGERY     Odontoid screw fixation of type 2 odontoid fracture, open reduction  and internal fixation of the fracture--patient had fallen down the stairs at 85 years old  . OTHER SURGICAL HISTORY  01/29/1998   Stage TIC adenocarcinoma of the prostate--Surgeon--David, Grapey M.D.--Interstital seed implantation with I-125 seeds--PSA of approximately 7  . PROSTATE SURGERY  approx 2003   [place] -Grapey  . TONSILLECTOMY       Home Medications:  Prior to Admission medications   Medication Sig Start Date End Date Taking? Authorizing Provider  metoprolol tartrate (LOPRESSOR) 25 MG tablet Take 0.5 tablets (12.5 mg total) by mouth 2 (two) times daily. 01/02/19  Yes [name], [name], MD  pantoprazole (PROTONIX) 40 MG tablet Take 1 tablet (40 mg total) by mouth 2 (two) times daily. 01/08/19 05/08/19 Yes [name], [name], MD  psyllium (METAMUCIL) 58.6 % packet Take 1 packet by mouth daily.   Yes [provider]  rosuvastatin (CRESTOR) 10 MG tablet TAKE 1 TABLET DAILY Patient taking differently: Take 10 mg by mouth daily.  12/19/18  Yes [name], [name], MD  warfarin (COUMADIN) 2.5 MG tablet Take 1 tablet (2.5 mg total) by mouth daily. Patient taking differently: Take 1.25-2.5 mg by mouth daily. Take 2.5 mg by mouth on Tuesday, Thursday, Saturday and Sunday. Take 1.25mg  by mouth on Monday, Wednesday, and Friday. 12/19/18  Yes [name], [name], MD    Inpatient Medications: Scheduled Meds: . furosemide  40 mg Intravenous Daily  . guaiFENesin  1,200 mg Oral BID  . metoprolol tartrate  12.5 mg Oral BID  . pantoprazole  40 mg Oral BID  . [START ON 01/18/2019] rosuvastatin  10 mg Oral QODAY  . sodium chloride flush  3 mL Intravenous Q12H   Continuous Infusions: . sodium chloride    . ceFEPime (MAXIPIME) IV Stopped (01/17/19 0003)  . vancomycin     PRN Meds: sodium chloride, acetaminophen **OR** acetaminophen, albuterol, bisacodyl, ondansetron **OR** ondansetron (ZOFRAN) IV, senna-docusate, sodium chloride flush, traMADol  Allergies:    Allergies  Allergen Reactions  . Asa  Buff (Mag [Buffered Aspirin] Nausea Only  . Zetia [Ezetimibe] Other (See Comments)    Can not remember reaction    Social History:   Social History   Socioeconomic History  . Marital status: Married    Spouse name: Not on file  . Number of children: Not on file  . Years of education: Not on file  . Highest education level: Not on file  Occupational History  . Not on file  Social Needs  . Financial resource strain: Not on file  . Food insecurity:    Worry: Not on file    Inability: Not on file  . Transportation needs:    Medical: Not on file    Non-medical: Not on file  Tobacco Use  . Smoking status: Never Smoker  . Smokeless tobacco: Never Used  Substance and Sexual Activity  . Alcohol use: No  . Drug use: No  . Sexual activity: Not on file  Lifestyle  . Physical activity:    Days per week: Not on file  Minutes per session: Not on file  . Stress: Not on file  Relationships  . Social connections:    Talks on phone: Not on file    Gets together: Not on file    Attends religious service: Not on file    Active member of club or organization: Not on file    Attends meetings of clubs or organizations: Not on file    Relationship status: Not on file  . Intimate partner violence:    Fear of current or ex partner: Not on file    Emotionally abused: Not on file    Physically abused: Not on file    Forced sexual activity: Not on file  Other Topics Concern  . Not on file  Social History Narrative  . Not on file    Family History:    Family History  Problem Relation Age of Onset  . Arthritis Mother   . Heart disease Father   . AAA (abdominal aortic aneurysm) Brother      ROS:  Please see the history of present illness.   All other ROS reviewed and negative.     Physical Exam/Data:   Vitals:   01/16/19 1830 01/16/19 1900 01/16/19 1951 01/17/19 0436  BP: 125/66 (!) 141/77 137/73 127/71  Pulse:  78 78 68  Resp: (!) 21 (!) 21 (!) 22 18  Temp:   98 F (36.7  C) 97.7 F (36.5 C)  TempSrc:   Oral Oral  SpO2:  96% 97% 99%  Weight:   63.2 kg 62.2 kg  Height:   5\' 7"  (1.702 m)     Intake/Output Summary (Last 24 hours) at 01/17/2019 0741 Last data filed at 01/17/2019 0439 Gross per 24 hour  Intake 300 ml  Output 900 ml  Net -600 ml   Filed Weights   01/16/19 1510 01/16/19 1951 01/17/19 0436  Weight: 59 kg 63.2 kg 62.2 kg   Body mass index is 21.49 kg/m.  General:  Thin elderly gentleman sitting in bedside chair in no acute distress HEENT: sclera anicteric  Neck: no JVD Vascular: No carotid bruits; distal pulses 2+ bilaterally; chronic venostasis skin changes to b/l LE Cardiac:  normal S1, S2; irregular rhythm/rate; no murmurs, rubs, or gallops Lungs:  Mild crackles at LLL, otherwise CTAB  Abd: NABS, soft, nontender, no hepatomegaly Ext: trace LE edema Musculoskeletal:  No deformities, BUE and BLE strength normal and equal Skin: warm and dry  Neuro:  CNs 2-12 intact, no focal abnormalities noted Psych:  Normal affect   EKG:  The EKG was personally reviewed and demonstrates:  Atrial fibrillation with rate 73bpm, non-specific T wave abnormalities, and no STE/D - no significant change from previous. Telemetry:  Telemetry was personally reviewed and demonstrates:  Atrial fibrillation with CVR  Relevant CV Studies: Echocardiogram 11/2015: Study Conclusions  - Left ventricle: The cavity size was normal. Wall thickness was   normal. Systolic function was normal. The estimated ejection   fraction was in the range of 55% to 60%. Wall motion was normal;   there were no regional wall motion abnormalities. Indeterminant   diastolic function. - Aortic valve: Trileaflet; moderately calcified leaflets.   Sclerosis without stenosis. There was trivial regurgitation. - Mitral valve: Moderately calcified annulus. There was mild   regurgitation. - Left atrium: The atrium was moderately dilated. - Right ventricle: The cavity size was normal.  Systolic function   was normal. - Right atrium: The atrium was moderately dilated. - Tricuspid valve: There was moderate regurgitation.  Peak RV-RA   gradient (S): 39 mm Hg. - Pulmonary arteries: PA peak pressure: 42 mm Hg (S). - Inferior vena cava: The vessel was normal in size. The   respirophasic diameter changes were in the normal range (= 50%),   consistent with normal central venous pressure.  Impressions:  - Normal LV size with EF 55-60%. Normal RV size and systolic   function. Moderate biatrial enlargement. Mild AI, aortic   sclerosis without stenosis. Mild MR. Moderate TR. Mild pulmonary   hypertension.  Laboratory Data:  Chemistry Recent Labs  Lab 01/16/19 1550 01/17/19 0522  NA 141 140  K 4.3 4.3  CL 109 108  CO2 25 26  GLUCOSE 109* 95  BUN 21 19  CREATININE 1.12 1.10  CALCIUM 8.5* 8.1*  GFRNONAA 56* 57*  GFRAA >60 >60  ANIONGAP 7 6    Recent Labs  Lab 01/17/19 0522  PROT 6.6  ALBUMIN 2.3*  AST 39  ALT 20  ALKPHOS 126  BILITOT 0.7   Hematology Recent Labs  Lab 01/16/19 1550 01/17/19 0522  WBC 7.2 6.7  RBC 2.96* 2.85*  2.85*  HGB 8.3* 7.7*  HCT 28.6* 27.3*  MCV 96.6 95.8  MCH 28.0 27.0  MCHC 29.0* 28.2*  RDW 17.9* 17.8*  PLT 374 348   Cardiac Enzymes Recent Labs  Lab 01/16/19 1550  TROPONINI 0.05*   No results for input(s): TROPIPOC in the last 168 hours.  BNP Recent Labs  Lab 01/16/19 1550  BNP 494.8*    DDimer No results for input(s): DDIMER in the last 168 hours.  Radiology/Studies:  Dg Chest Port 1 View  Result Date: 01/16/2019 CLINICAL DATA:  Low oxygen saturation.  Shortness of breath. EXAM: PORTABLE CHEST 1 VIEW COMPARISON:  01/05/2019.  09/02/2017. FINDINGS: Previous median sternotomy. The patient has a background pattern mild chronic pulmonary scarring at the bases. There is diffuse pulmonary hazy opacity which has worsened further since the study of 11 days ago, consistent with pneumonia. There is some volume loss and  consolidation in the left lower lobe. There may be some pleural fluid on left. Findings are presumed secondary to pneumonia, though there could be an element of fluid overload/edema. IMPRESSION: Continued worsening of diffuse pulmonary density. Left lower lobe density and probable left effusion. Findings could relate to a combination of pneumonia and heart failure. Electronically Signed   By: Nelson Chimes M.D.   On: 01/16/2019 15:49    Assessment and Plan:   1. Acute respiratory failure: patient presented with asymptomatic hypoxia with O2 sat in the 70s at home. Recently admitted 01/05/2019-01/08/2019 for anemia and CAP. CXR this admission with PNA and possible CHF. BNP 494 (563 01/05/2019). He was started on broad spectrum antibiotics for HCAP and gentle diuresis with IV lasix 20mg  x1. UOP with net -693mL overnight. Weight 139>137lbs. He has no significant CHF complaints. Echo pending to evaluate for CHF, though atrial fibrillation will limit evaluation of LV diastolic function. His albumin is also low which could contribute to fluid retention. Likely multifactorial though suspect HCAP > underlying diastolic CHF.  - Will follow-up echocardiogram - Continue diuresis with IV lasix 40 mg today - anticipate transition to po tomorrow. - Continue to monitor daily weights and strict I&Os - Continue to monitor electrolytes and replete as needed to maintain K>4, Mg>2 - Continue antibiotics per primary team - Continue supportive care with nebulizers and expectorants per primary team  2. Elevated troponin: trop 0.05 on admission, no trend. EKG without ischemic changes. Patient  has no complaints of chest pain or DOE. Suspect mild elevation is demand ischemia in the setting a HCAP and CHF. Echo pending - If no decline in LV function or RWMA noted on echo, do not ancipitate further ischemic evaluation  3. Anemia: patient was recently admitted 01/05/2019-01/08/2019 with anemia and melena (-FOBT). He receive 1 uPRBC  with improvement in Hgb to 8. Hgb appears stable from recent admission. INR is supratherapeutic at 6.4 today. No complaints of bleeding - Continue to monitor closely   - Resume home coumadin once INR is in the therapeutic window - Transfuse to maintain Hgb of 8 given CAD/CABG history  4. CAD s/p CABG in 1984: no anginal complaints. Not on ASA given need for coumadin for atrial fibrillation.  - Continue metoprolol and statin  5. Permanent atrial fibrillation: rate well controlled this admission. His INR is supratherapeutic at 6.4 on labs today. Coumadin on hold - Continue metoprolol for rate control - Resume home coumadin once INR is in the therapeutic window  6. HLD: LDL 82 on labs 11/2017 - Continue statin  7. AAA s/p EVAR 11/2015: he had small type 2 endoleak post repair which was not seen on duplex 09/2018.  - Continue routine outpatient monitoring.   For questions or updates, please contact [place] Please consult [email or web address] for contact info under Cardiology/STEMI.   Signed, Abigail Butts, PA-C  01/17/2019 7:41 AM [phone]

## 2019-01-17 NOTE — Progress Notes (Signed)
[facility] for warfarin Indication: hx atrial fibrillation  Allergies  Allergen Reactions  . Asa Buff (Mag [Buffered Aspirin] Nausea Only  . Zetia [Ezetimibe] Other (See Comments)    Can not remember reaction    Patient Measurements: Height: 5\' 7"  (170.2 cm) Weight: 137 lb 3.2 oz (62.2 kg) IBW/kg (Calculated) : 66.1 Heparin Dosing Weight:  Vital Signs: Temp: 97.7 F (36.5 C) (05/27 0436) Temp Source: Oral (05/27 0436) BP: 125/70 (05/27 0959) Pulse Rate: 64 (05/27 0959)  Labs: Recent Labs    01/16/19 1550 01/16/19 2041 01/17/19 0522  HGB 8.3*  --  7.7*  HCT 28.6*  --  27.3*  PLT 374  --  348  LABPROT  --  52.9* 55.1*  INR  --  6.1* 6.4*  CREATININE 1.12  --  1.10  TROPONINI 0.05*  --   --     Estimated Creatinine Clearance: 36.1 mL/min (by C-G formula based on SCr of 1.1 mg/dL).   Medications:  PTA warfarin regimen: 2.5 mg daily except 1.25 mg on MWF (last dose taken on 5/26)  Assessment: Patient is a 85 y.o M with hx afib on warfarin PTA, presented to the ED with SOB.  Warfarin resumed on admission.  Today, 01/17/19  INR SUPRAtherapeutic at 6.4  No reported bleeding  Hgb 7.7 trending down, Plts stable  Goal of Therapy:  INR 2-3 Monitor platelets by anticoagulation protocol: Yes   Plan:  1) No warfarin today  2) Daily INR 3) Monitor for s/s bleeding  Fitzhugh, [place] 01/17/2019,10:23 AM

## 2019-01-17 NOTE — Progress Notes (Signed)
CRITICAL VALUE STICKER 2nd Lab Draw  CRITICAL VALUE: INR 6.4   RECEIVER (on-site recipient of call): Traci Sermon, RN  DATE & TIME NOTIFIED:  01/17/19 @ [phone]  MESSENGER (representative from lab):   Elmyra Ricks  MD NOTIFIED:   Lamar Blinks  TIME OF NOTIFICATION:  5217  RESPONSE:  Still awaiting orders

## 2019-01-17 NOTE — Progress Notes (Signed)
PHARMACY NOTE -  Unasyn  Pharmacy has been assisting with dosing of Unasyn for aspiration PNA.  Dosage remains stable at 3g IV q6 hr and need for further dosage adjustment appears unlikely at present given CrCl > 30 ml/hr  Pharmacy will sign off, following peripherally for culture results or dose adjustments. Please reconsult if a change in clinical status warrants re-evaluation of dosage.  Reuel Boom, PharmD, BCPS [phone] 01/17/2019, 3:57 PM

## 2019-01-17 NOTE — Evaluation (Addendum)
Clinical/Bedside Swallow Evaluation Patient Details  Name: Gary Valencia MRN: [id number] Date of Birth: Jan 04, 1923  Today's Date: 01/17/2019 Time: SLP Start Time (ACUTE ONLY): 1255 SLP Stop Time (ACUTE ONLY): 1310 SLP Time Calculation (min) (ACUTE ONLY): 15 min  Past Medical History:  Past Medical History:  Diagnosis Date  . AAA (abdominal aortic aneurysm) ([place])   . Actinic keratoses   . Anxiety   . ASHD (arteriosclerotic heart disease) 1984  . Atrial fibrillation ([place])   . Cancer ([place]) 1999   PROSTATE  . CVA (cerebral vascular accident) ([place])   . Diverticulosis   . Dyslipidemia   . Dysrhythmia   . Personal history of colonic polyps    Past Surgical History:  Past Surgical History:  Procedure Laterality Date  . ABDOMINAL AORTIC ENDOVASCULAR STENT GRAFT N/A 12/11/2015   Procedure: ABDOMINAL AORTIC ENDOVASCULAR STENT GRAFT;  Surgeon: Serafina Mitchell, MD;  Location: [facility];  Service: Vascular;  Laterality: N/A;  . CORONARY ARTERY BYPASS GRAFT  1984   [facility]- Dr. Redmond Pulling x4  . EYE SURGERY Bilateral    cataracts  . ODONTOID FRACTURE SURGERY     Odontoid screw fixation of type 2 odontoid fracture, open reduction and internal fixation of the fracture--patient had fallen down the stairs at 85 years old  . OTHER SURGICAL HISTORY  01/29/1998   Stage TIC adenocarcinoma of the prostate--Surgeon--David, Grapey M.D.--Interstital seed implantation with I-125 seeds--PSA of approximately 7  . PROSTATE SURGERY  approx 2003   [place] -[place]  . TONSILLECTOMY     HPI:  Patient is a 85 y.o. male with PMH: possible recent CVA with residual dysarthria, prostate cancer, atrial fibrilation on anticoagulation with Coumadin, dyslipidemia, CAD s/p CABG, who was brought to ER acute respiratory failure.  PSH + for type 2 odontoid fx, s/p screw fixation at 85 years of age after fall.  Pt CXR 01/17/2019 showed Bilateral patchy airspace disease representing multifocal pneumonia, not significantly different  than prior date.  CXR during prior admit revealed left lower lobe infiltrate with small pleural effusion.  Swallow eval ordered.  Pt had been on dys3/nectar diet and was advanced to dys3/thin after MBS 01/10/2019.  Found to have suspected chronic dysphagia from his recent CVA, neck surgery, and age.     Assessment / Plan / Recommendation Clinical Impression  Pt continues to demonstrate clinical indication of pharyngeal dysphagia consistent with findings from prior BSE and MBS 01/10/2019.  SLP reviewed prior MBS study flouro loops showing decreased laryngeal closure/elevation, poor epiglottic deflection resulting in pharyngeal resdiuals, laryngeal penetration and aspiration.  Secretion retention noted in pharynx also.  Dry swallows helpful to decrease residuals.  Pt also conducted double swallow with breath hold with thin liquids which is helpful to decrease amount of penetration and aspiration.  Today during pt's clinical po observation of lunch he continues with multiple swallows, throat clearing after liquids and cough x1 after solids with expectoration of masticated spaghetti. Encouraged pt to maintain strength of cough/hock and throat clearing for airway protection.   Reviewed prior MBS with pt and informed him of chronic nature of dysphagia and SLP role of aspiration mitigation only.  SLP to work with pt on his breath hold double swallow to attempt to incorporate within his compensations.    Concerns for recurrent pulmonary infections and possible eventual weight loss due to pt's current level of dysphagia is present.  Given his advanced age, dysphagia, 2nd admission - would recommend a palliative consult to help establish goals of care for  this most kind pt.  Recommend continue dys3/nectar and allow pt to have thin water between meals after oral care.     SLP Visit Diagnosis: Dysphagia, oropharyngeal phase (R13.12)    Aspiration Risk  Moderate aspiration risk    Diet Recommendation Dysphagia 3  (Mech soft);Nectar-thick liquid;Other (Comment);Free water protocol after oral care   Liquid Administration via: Cup;Straw Medication Administration: Crushed with puree Supervision: Patient able to self feed Compensations: Slow rate;Small sips/bites;Multiple dry swallows after each bite/sip;Other (Comment)(intermittent hock, clear throat strongly and reswallow if refleixvely clearing throat) Postural Changes: Seated upright at 90 degrees;Remain upright for at least 30 minutes after po intake    Other  Recommendations Oral Care Recommendations: Oral care BID Other Recommendations: Order thickener from pharmacy   Follow up Recommendations Other (comment)(tbd)      Frequency and Duration min 2x/week  1 week       Prognosis Prognosis for Safe Diet Advancement: Guarded Barriers to Reach Goals: Severity of deficits;Other (Comment);Time post onset Barriers/Prognosis Comment: age, level of dysphagia, recurrent pulmonary infections within 2 weeks, recent cva      Swallow Study   General Date of Onset: 01/17/19 HPI: Patient is a 85 y.o. male with PMH: possible recent CVA with residual dysarthria, prostate cancer, atrial fibrilation on anticoagulation with Coumadin, dyslipidemia, CAD s/p CABG, who was brought to ER acute respiratory failure.  PSH + for type 2 odontoid fx, s/p screw fixation at 85 years of age after fall.  Pt CXR 01/17/2019 showed Bilateral patchy airspace disease representing multifocal pneumonia, not significantly different than prior date.  CXR during prior admit revealed left lower lobe infiltrate with small pleural effusion.  Swallow eval ordered.  Pt had been on dys3/nectar diet and was advanced to dys3/thin after MBS 01/10/2019.  Found to have suspected chronic dysphagia from his recent CVA, neck surgery, and age.   Type of Study: Bedside Swallow Evaluation Previous Swallow Assessment: prior mbs, resdiuals across all consistencies, head turn nor chin tuck helpful, aspiration  with thin and nectar, cleared with throat clear and reswallow, dry swallows helpful Diet Prior to this Study: Dysphagia 3 (soft);Nectar-thick liquids Temperature Spikes Noted: No Respiratory Status: Nasal cannula(2) History of Recent Intubation: No Behavior/Cognition: Alert;Cooperative;Pleasant mood;Other (Comment)(pt repeated information re: chin tuck posture recommended by [facility] nurse repeatedly during BSE, ? some baseline cognitive deficits?) Oral Cavity Assessment: Other (comment)(minimal amount of food residuals retained) Oral Care Completed by SLP: No(pt was eating) Oral Cavity - Dentition: Other (Comment)(present) Vision: Functional for self-feeding Self-Feeding Abilities: Able to feed self Patient Positioning: Upright in chair Baseline Vocal Quality: Hoarse(mildly hoarse) Volitional Cough: Strong Volitional Swallow: (dnt)    Oral/Motor/Sensory Function Overall Oral Motor/Sensory Function: Within functional limits(? minimal lower facial asymmetry)   Ice Chips Ice chips: Not tested   Thin Liquid Thin Liquid: Not tested Other Comments: RN reports pt was overtly coughing, choking during intake of juice this am and given his multifocal pna, did not want to continue overt aspiration    Nectar Thick Nectar Thick Liquid: Impaired Pharyngeal Phase Impairments: Throat Clearing - Immediate;Throat Clearing - Delayed;Multiple swallows   Honey Thick Honey Thick Liquid: Not tested   Puree Puree: Not tested   Solid     Solid: Impaired Pharyngeal Phase Impairments: Wet Vocal Quality;Multiple swallows;Throat Clearing - Immediate Other Comments: pt with cough and expectoration of masticated spaghetti - likely from vallecular space      Macario Golds 01/17/2019,2:42 PM   Luanna Salk, [place] Pager [phone]  Office [phone]

## 2019-01-17 NOTE — Evaluation (Signed)
Occupational Therapy Evaluation Patient Details Name: Gary Valencia MRN: [id number] DOB: [birth year] Today's Date: 01/17/2019    History of Present Illness 85 y.o. male with medical history significant of AAA, history of actinic keratosis, history of anxiety, history of atherosclerotic heart disease with CAD status post CABG in 1984, history of permanent atrial fibrillation on chronic anticoagulation with warfarin, history of prostate cancer, hyperlipidemia, anxiety, history of anemia, and other comorbidities who presents with low oxygen saturation and shortness of muscle exertion.  Of note patient was recently hospitalized 11 days ago for symptomatic anemia and pneumonia and was discharged home on the 18th.    Clinical Impression   This 85 y/o male presents with the above. PTA pt reports he is mod independent with ADL and functional mobility using RW. Pt presenting with generalized weakness, decreased activity tolerance today. Pt requiring minA for room level functional mobility using RW. He currently requires minguard-minA for seated UB ADL; modA for LB and toileting ADL. Use of 3L supplemental O2 during session however unable to obtain accurate SpO2 reading with attempts at using both portable pulse ox as well as dynamap, RN made aware. DOE grossly 2/4 with room level activity. Pt will benefit from continued acute OT services and recommend follow up [facility] therapy services as well as 24hr supervision/assist at time of discharge to maximize his safety and independence with ADL and mobility. Will follow.     Follow Up Recommendations  Home health OT;Supervision/Assistance - 24 hour    Equipment Recommendations  3 in 1 bedside commode           Precautions / Restrictions Precautions Precautions: Fall Precaution Comments: monitor O2 Restrictions Weight Bearing Restrictions: No      Mobility Bed Mobility Overal bed mobility: Needs Assistance Bed Mobility: Supine to Sit     Supine to  sit: Min guard     General bed mobility comments: increased time and effort but no physical assist required  Transfers Overall transfer level: Needs assistance   Transfers: Sit to/from Stand Sit to Stand: Min assist         General transfer comment: boosting assist to rise and steady at RW, increased time to perform; pt with good hand placement    Balance Overall balance assessment: Needs assistance Sitting-balance support: Feet supported Sitting balance-Leahy Scale: Fair     Standing balance support: Bilateral upper extremity supported Standing balance-Leahy Scale: Poor Standing balance comment: reliant on UE support                           ADL either performed or assessed with clinical judgement   ADL Overall ADL's : Needs assistance/impaired Eating/Feeding: Set up;Sitting   Grooming: Set up;Min guard;Sitting   Upper Body Bathing: Min guard;Minimal assistance;Sitting   Lower Body Bathing: Moderate assistance;Sit to/from stand   Upper Body Dressing : Minimal assistance;Sitting Upper Body Dressing Details (indicate cue type and reason): doffing/donning new gown Lower Body Dressing: Moderate assistance;Sit to/from stand Lower Body Dressing Details (indicate cue type and reason): assisted to don socks today, minA standing balance Toilet Transfer: Minimal assistance;Ambulation;Regular Toilet;Grab bars;RW   Toileting- Clothing Manipulation and Hygiene: Moderate assistance;Sit to/from stand Toileting - Clothing Manipulation Details (indicate cue type and reason): pt condom cath leaking and then falling off during session; pt attempting to use urinal at bed level and then transitioned to sitting on toilet to attempt for BM, assist for gown management     Functional mobility during ADLs:  Minimal assistance;Rolling walker       Vision         Perception     Praxis      Pertinent Vitals/Pain Pain Assessment: No/denies pain     Hand Dominance      Extremity/Trunk Assessment Upper Extremity Assessment Upper Extremity Assessment: Generalized weakness   Lower Extremity Assessment Lower Extremity Assessment: Defer to PT evaluation       Communication Communication Communication: HOH   Cognition Arousal/Alertness: Awake/alert Behavior During Therapy: WFL for tasks assessed/performed Overall Cognitive Status: Within Functional Limits for tasks assessed                                 General Comments: appears WFL, pt HOH and requires repetition during session   General Comments       Exercises     Shoulder Instructions      Home Living Family/patient expects to be discharged to:: Private residence Living Arrangements: Spouse/significant other Available Help at Discharge: Family Type of Home: House Home Access: Stairs to enter Technical brewer of Steps: 3         Bathroom Shower/Tub: Teacher, early years/pre: Standard     Home Equipment: Environmental consultant - 2 wheels;Grab bars - toilet;Grab bars - tub/shower;Shower seat          Prior Functioning/Environment Level of Independence: Independent with assistive device(s)        Comments: pt reports typically ambulates with RW; reports no assist for ADL, reports he is still working on his lawn        OT Problem List: Decreased strength;Decreased range of motion;Decreased activity tolerance;Impaired balance (sitting and/or standing);Decreased knowledge of use of DME or AE;Cardiopulmonary status limiting activity      OT Treatment/Interventions: Self-care/ADL training;Therapeutic exercise;Energy conservation;Therapeutic activities;Patient/family education;Balance training;DME and/or AE instruction    OT Goals(Current goals can be found in the care plan section) Acute Rehab OT Goals Patient Stated Goal: home soon, maintain his independence OT Goal Formulation: With patient Time For Goal Achievement: 01/31/19 Potential to Achieve Goals: Good   OT Frequency: Min 2X/week   Barriers to D/C:            Co-evaluation              AM-PAC OT "6 Clicks" Daily Activity     Outcome Measure Help from another person eating meals?: None Help from another person taking care of personal grooming?: A Little Help from another person toileting, which includes using toliet, bedpan, or urinal?: A Little Help from another person bathing (including washing, rinsing, drying)?: A Lot Help from another person to put on and taking off regular upper body clothing?: None Help from another person to put on and taking off regular lower body clothing?: A Lot 6 Click Score: 18   End of Session Equipment Utilized During Treatment: Oxygen;Rolling walker;Gait belt Nurse Communication: Mobility status  Activity Tolerance: Patient tolerated treatment well Patient left: in chair;with call bell/phone within reach;with chair alarm set  OT Visit Diagnosis: Unsteadiness on feet (R26.81);Muscle weakness (generalized) (M62.81)                Time: 3818-2993 OT Time Calculation (min): 53 min Charges:  OT General Charges $OT Visit: 1 Visit OT Evaluation $OT Eval Moderate Complexity: 1 Mod OT Treatments $Self Care/Home Management : 23-37 mins  Lou Cal, OT [facility] Pager [phone] Office [place]  01/17/2019, 1:02 PM

## 2019-01-17 NOTE — Progress Notes (Signed)
2D Echocardiogram has been performed.  Gary Valencia 01/17/2019, 9:42 AM

## 2019-01-17 NOTE — Progress Notes (Signed)
Pt's AM CBG 65. MD on floor made aware. Pt given applesauce one container instead of Juice due to difficulty swallowing thin liquids. Pt denies needs

## 2019-01-17 NOTE — Progress Notes (Signed)
PROGRESS NOTE    Gary Valencia  EXH:[id number] DOB: 12-Oct-1922 DOA: 01/16/2019 PCP: Gary Lung, MD  Brief Narrative: 85 y.o. male with medical history significant of AAA, history of actinic keratosis, history of anxiety, history of atherosclerotic heart disease with CAD status post CABG in 1984, history of permanent atrial fibrillation on chronic anticoagulation with warfarin, history of prostate cancer, hyperlipidemia, anxiety, history of anemia, and other comorbidities who presents with low oxygen saturation and shortness of muscle exertion.  Of note patient was recently hospitalized 11 days ago for symptomatic anemia and pneumonia and was discharged home on the 18th.  Patient went home and he was doing relatively well but home health nurse came out today and noticed that his oxygen saturations were low.  Patient was not symptomatic but he was directed to the emergency room for further evaluation.  Patient did admit to having some shortness of breath on exertion but denies chest pain, lightheadedness or dizziness.  Chest x-ray showed worsening pneumonia and possible concomitant CHF exacerbation and so patient was admitted for his acute respiratory failure with hypoxia.  He was placed on broad-spectrum antibiotics for H CAP coverage and given gentle diuresis with IV 20 mg of Lasix.  BNP was elevated on examination and will obtain an echocardiogram.  TRH was called admit this patient and he was placed on medical telemetry.  ED Course: ED patient had basic blood work done and was given a dose of IV 20 mg of Lasix and was also placed on broad-spectrum antibiotics with vancomycin and Zosyn.  Chest x-ray was also obtained.  COVID testing was NEGATIVE   Assessment & Plan:   Active Problems:   Atrial fibrillation, permanent   History of prostate cancer   Hyperlipidemia   CAD - S/P CABG in 1984   HTN (hypertension)   Chronic anticoagulation -Warfarin   Personal history of colonic polyps  Dyslipidemia   Severe anemia   Acute respiratory failure with hypoxia (HCC)   HCAP (healthcare-associated pneumonia)  Acute Respiratory Failure with Hypoxia in the setting of HCAP pneumonia vs aspiration pneumonia -Patient was saturating in the 70s on room air at home; staff reports patient was coughing upon drinking water. -Pulse oximetry and maintain O2 saturations greater than 92% -Continue supplemental oxygen via nasal cannula and wean as tolerated -BNP on Admission was 494.8 and down from previous; continue Lasix 40 mg daily.  Continue IV antibiotics.  If he is truly aspirating I will change his antibiotics to Unasyn.- -CXR on admission showed "Previous median sternotomy. The patient has a background pattern mild chronic pulmonary scarring at the bases. There is diffuse pulmonary hazy opacity which has worsened further since the study of 11 days ago, consistent with pneumonia. There is some volume loss and consolidation in the left lower lobe. There may be some pleural fluid on left. Findings are presumed secondary to pneumonia, though there could be an element of fluid overload/edema" -SARS-CoV-2 test is Negative this Admission; Las one 11 days ago was Negative  -Repeat CXR bilateral patchy airspace disease representing multifocal pneumonia versus pulmonary edema.  No significant change from yesterday.  Elevated BNP with Concern for Acute CHF Exacerbation -Last ECHOCardigoram was done in 2006 follow-up echo done today results pending.-Patient does have Dyspnea on Exertion -BNP was 494.8 and last visit was 563.8 -Strict I's and O's, Daily Weights -Continue to Monitor for S/Sx of Volume Overload  -Appreciate cardiology recommendations continue Lasix.  Elevated Troponin -Mild at 0.05 and likely Demand Ischemia  -EKG  showed atrial fibrillation  Normocytic Anemia -Patient's hemoglobin/hematocrit upon admission was 8.3 down to 7.7 today with no evidence of active bleeding.  With diuresis  his hemoglobin should be increasing with hemoconcentration.  I will order 1 unit of blood transfusion as his real hemoglobin may be lower than 7.7.  Anemia panel indicates he is very low in iron I will also give him IV Feraheme.  His INR is very elevated at 6 hold Coumadin. -Recently had some GI bleeding/symptomatic anemia so we will continue with Protonix 40 mg p.o. twice daily -Repeat CBC in a.m   Permanent Atrial Fibrillation -Patient was atrial fibrillation on admission rates in the 70s -Continue with home metoprolol and home anticoagulation with warfarin with pharmacy to dose -Checking echocardiogram for his dyspnea  Hyperlipidemia/Dyslipidemia -Continue with home statin  Hypeglycemia -Patient's blood sugar on admission was 109 -Questionable is reactive -Check hemoglobin A1c in the AM  AAA -Will need to follow up on U/S as outpatient  DVT prophylaxis: Anticoagulated with Warfarin Code Status: FULL CODE Family Communication:  Discussed with his wife she told me that we must communicate with his son Gary Valencia, Gary Valencia. at [phone]. Disposition Plan: Remain inpatient for treatment for pneumonia and CHF Consults called: Notified cards manager via epic   Pressure Injury 01/07/19 Stage I -  Intact skin with non-blanchable redness of a localized area usually over a bony prominence. reddened area over boney prominance; Allevyn intact (Active)  01/07/19 1145  Location: Vertebral column  Location Orientation: Mid;Upper  Staging: Stage I -  Intact skin with non-blanchable redness of a localized area usually over a bony prominence.  Wound Description (Comments): reddened area over boney prominance; Allevyn intact  Present on Admission: No     Estimated body mass index is 21.49 kg/m as calculated from the following:   Height as of this encounter: 5\' 7"  (1.702 m).   Weight as of this encounter: 62.2 kg.   Subjective:  He is resting in bed complains of some difficulty  breathing with exertion no chest pain no nausea vomiting Objective: Vitals:   01/16/19 1830 01/16/19 1900 01/16/19 1951 01/17/19 0436  BP: 125/66 (!) 141/77 137/73 127/71  Pulse:  78 78 68  Resp: (!) 21 (!) 21 (!) 22 18  Temp:   98 F (36.7 C) 97.7 F (36.5 C)  TempSrc:   Oral Oral  SpO2:  96% 97% 99%  Weight:   63.2 kg 62.2 kg  Height:   5\' 7"  (1.702 m)     Intake/Output Summary (Last 24 hours) at 01/17/2019 0958 Last data filed at 01/17/2019 0439 Gross per 24 hour  Intake 300 ml  Output 900 ml  Net -600 ml   Filed Weights   01/16/19 1510 01/16/19 1951 01/17/19 0436  Weight: 59 kg 63.2 kg 62.2 kg    Examination:  General exam: Appears calm and comfortable  Respiratory system: Scattered rhonchi with decreased breath sounds at the bases to auscultation. Respiratory effort normal. Cardiovascular system: S1 & S2 heard, RRR. No JVD, murmurs, rubs, gallops or clicks. No pedal edema. Gastrointestinal system: Abdomen is nondistended, soft and nontender. No organomegaly or masses felt. Normal bowel sounds heard. Central nervous system: Alert and oriented. No focal neurological deficits. Extremities: Symmetric 5 x 5 power.  No edema Skin: No rashes, lesions or ulcers Psychiatry: Judgement and insight appear normal. Mood & affect appropriate.     Data Reviewed: I have personally reviewed following labs and imaging studies  CBC: Recent Labs  Lab 01/16/19  1550 01/17/19 0522  WBC 7.2 6.7  NEUTROABS 5.8 5.4  HGB 8.3* 7.7*  HCT 28.6* 27.3*  MCV 96.6 95.8  PLT 374 902   Basic Metabolic Panel: Recent Labs  Lab 01/16/19 1550 01/16/19 2041 01/17/19 0522  NA 141  --  140  K 4.3  --  4.3  CL 109  --  108  CO2 25  --  26  GLUCOSE 109*  --  95  BUN 21  --  19  CREATININE 1.12  --  1.10  CALCIUM 8.5*  --  8.1*  MG  --  2.1 2.3  PHOS  --  3.6 4.1   GFR: Estimated Creatinine Clearance: 36.1 mL/min (by C-G formula based on SCr of 1.1 mg/dL). Liver Function Tests: Recent  Labs  Lab 01/17/19 0522  AST 39  ALT 20  ALKPHOS 126  BILITOT 0.7  PROT 6.6  ALBUMIN 2.3*   No results for input(s): LIPASE, AMYLASE in the last 168 hours. No results for input(s): AMMONIA in the last 168 hours. Coagulation Profile: Recent Labs  Lab 01/12/19 01/16/19 2041 01/17/19 0522  INR 3.0 6.1* 6.4*   Cardiac Enzymes: Recent Labs  Lab 01/16/19 1550  TROPONINI 0.05*   BNP (last 3 results) No results for input(s): PROBNP in the last 8760 hours. HbA1C: No results for input(s): HGBA1C in the last 72 hours. CBG: Recent Labs  Lab 01/17/19 0714 01/17/19 0752  GLUCAP 65* 72   Lipid Profile: No results for input(s): CHOL, HDL, LDLCALC, TRIG, CHOLHDL, LDLDIRECT in the last 72 hours. Thyroid Function Tests: Recent Labs    01/16/19 2041  TSH 4.125  FREET4 1.00   Anemia Panel: Recent Labs    01/17/19 0522  VITAMINB12 555  FOLATE 20.5  FERRITIN 89  TIBC 240*  IRON 20*  RETICCTPCT 1.9   Sepsis Labs: Recent Labs  Lab 01/16/19 1605 01/16/19 2041  LATICACIDVEN 1.2 1.5    Recent Results (from the past 240 hour(s))  SARS Coronavirus 2 (CEPHEID - Performed in [facility] hospital lab), Hosp Order     Status: None   Collection Time: 01/16/19  3:22 PM  Result Value Ref Range Status   SARS Coronavirus 2 NEGATIVE NEGATIVE Final    Comment: (NOTE) If result is NEGATIVE SARS-CoV-2 target nucleic acids are NOT DETECTED. The SARS-CoV-2 RNA is generally detectable in upper and lower  respiratory specimens during the acute phase of infection. The lowest  concentration of SARS-CoV-2 viral copies this assay can detect is 250  copies / mL. A negative result does not preclude SARS-CoV-2 infection  and should not be used as the sole basis for treatment or other  patient management decisions.  A negative result may occur with  improper specimen collection / handling, submission of specimen other  than nasopharyngeal swab, presence of viral mutation(s) within the  areas  targeted by this assay, and inadequate number of viral copies  (<250 copies / mL). A negative result must be combined with clinical  observations, patient history, and epidemiological information. If result is POSITIVE SARS-CoV-2 target nucleic acids are DETECTED. The SARS-CoV-2 RNA is generally detectable in upper and lower  respiratory specimens dur ing the acute phase of infection.  Positive  results are indicative of active infection with SARS-CoV-2.  Clinical  correlation with patient history and other diagnostic information is  necessary to determine patient infection status.  Positive results do  not rule out bacterial infection or co-infection with other viruses. If result is PRESUMPTIVE POSTIVE  SARS-CoV-2 nucleic acids MAY BE PRESENT.   A presumptive positive result was obtained on the submitted specimen  and confirmed on repeat testing.  While 2019 novel coronavirus  (SARS-CoV-2) nucleic acids may be present in the submitted sample  additional confirmatory testing may be necessary for epidemiological  and / or clinical management purposes  to differentiate between  SARS-CoV-2 and other Sarbecovirus currently known to infect humans.  If clinically indicated additional testing with an alternate test  methodology [phone]) is advised. The SARS-CoV-2 RNA is generally  detectable in upper and lower respiratory sp ecimens during the acute  phase of infection. The expected result is Negative. Fact Sheet for Patients:  StrictlyIdeas.no Fact Sheet for Healthcare Providers: BankingDealers.co.za This test is not yet approved or cleared by the [place] FDA and has been authorized for detection and/or diagnosis of SARS-CoV-2 by FDA under an Emergency Use Authorization (EUA).  This EUA will remain in effect (meaning this test can be used) for the duration of the COVID-19 declaration under Section 564(b)(1) of the Act, 21 U.S.C. section  360bbb-3(b)(1), unless the authorization is terminated or revoked sooner. Performed at [facility], [place] [address] [place], [place] [address]          Radiology Studies: X-ray Chest Pa And Lateral  Result Date: 01/17/2019 CLINICAL DATA:  SOB EXAM: CHEST - 2 VIEW COMPARISON:  01/16/19, CT 10/07/2015 FINDINGS: Sternal wires overlie normal cardiac silhouette. Scattered bilateral fine airspace disease similar prior. Low Valencia volumes. No pneumothorax. Lateral projection demonstrates chronic compression fracture at the thoracolumbar junction with kyphosis. Abdominal stent graft noted IMPRESSION: Bilateral patchy airspace disease representing multifocal pneumonia versus pulmonary edema. No significant change from 1 day prior. Electronically Signed   By: Suzy Bouchard M.D.   On: 01/17/2019 09:21   Dg Chest Port 1 View  Result Date: 01/16/2019 CLINICAL DATA:  Low oxygen saturation.  Shortness of breath. EXAM: PORTABLE CHEST 1 VIEW COMPARISON:  01/05/2019.  09/02/2017. FINDINGS: Previous median sternotomy. The patient has a background pattern mild chronic pulmonary scarring at the bases. There is diffuse pulmonary hazy opacity which has worsened further since the study of 11 days ago, consistent with pneumonia. There is some volume loss and consolidation in the left lower lobe. There may be some pleural fluid on left. Findings are presumed secondary to pneumonia, though there could be an element of fluid overload/edema. IMPRESSION: Continued worsening of diffuse pulmonary density. Left lower lobe density and probable left effusion. Findings could relate to a combination of pneumonia and heart failure. Electronically Signed   By: Nelson Chimes M.D.   On: 01/16/2019 15:49        Scheduled Meds: . furosemide  40 mg Intravenous Daily  . guaiFENesin  1,200 mg Oral BID  . metoprolol tartrate  12.5 mg Oral BID  . pantoprazole  40 mg Oral BID  . [START ON 01/18/2019] rosuvastatin  10  mg Oral QODAY  . sodium chloride flush  3 mL Intravenous Q12H   Continuous Infusions: . sodium chloride    . ceFEPime (MAXIPIME) IV Stopped (01/17/19 0003)  . vancomycin       LOS: 1 day     [name], MD [facility]  If 7PM-7AM, please contact night-coverage [email or web address] Password [facility] 01/17/2019, 9:58 AM

## 2019-01-17 NOTE — Evaluation (Signed)
Physical Therapy Evaluation Patient Details Name: Gary Valencia MRN: [id number] DOB: 06-04-1923 Today's Date: 01/17/2019   History of Present Illness  85 y.o. male with medical history significant of AAA, history of actinic keratosis, history of anxiety, history of atherosclerotic heart disease with CAD status post CABG in 1984, history of permanent atrial fibrillation on chronic anticoagulation with warfarin, history of prostate cancer, hyperlipidemia, anxiety, history of anemia, and other comorbidities who presents with low oxygen saturation and shortness of muscle exertion.  Of note patient was recently hospitalized 11 days ago for symptomatic anemia and pneumonia and was discharged home on the 18th.   Clinical Impression  Pt admitted with above diagnosis. Pt currently with functional limitations due to the deficits listed below (see PT Problem List).  Pt will benefit from skilled PT to increase their independence and safety with mobility to allow discharge to the venue listed below.  Pt up in recliner on arrival and requesting assist back to bed.  Pt assisted with transfer and then wished to use BSC prior to returning to supine.  Pt requiring min assist for transfers at this time.  Pt reports he is active with HHPT.  SPO2 on room air at rest 91% however would not further read after transferring on room air so reapplied 2.5 L O2 [place].     Follow Up Recommendations Home health PT;Supervision/Assistance - 24 hour    Equipment Recommendations  None recommended by PT    Recommendations for Other Services       Precautions / Restrictions Precautions Precautions: Fall Precaution Comments: monitor O2 Restrictions Weight Bearing Restrictions: No      Mobility  Bed Mobility Overal bed mobility: Needs Assistance Bed Mobility: Supine to Sit     Supine to sit: Min guard     General bed mobility comments: pt OOB with OT  Transfers Overall transfer level: Needs assistance Equipment  used: Rolling walker (2 wheeled) Transfers: Sit to/from [facility] Sit to Stand: Min assist         General transfer comment: pt requiring assist to rise and steady with RW, increased time, utilizes UE to self assist, pt performed stand pivot back to bed and then requested to use BSC to transferred to [facility]  Ambulation/Gait             General Gait Details: deferred due to fatigue and elevated INR  Stairs            Wheelchair Mobility    Modified Rankin (Stroke Patients Only)       Balance Overall balance assessment: Needs assistance Sitting-balance support: Feet supported Sitting balance-Leahy Scale: Fair     Standing balance support: Bilateral upper extremity supported Standing balance-Leahy Scale: Poor Standing balance comment: reliant on UE support                             Pertinent Vitals/Pain Pain Assessment: Faces Faces Pain Scale: Hurts little more Pain Location: "backside from this chair" Pain Descriptors / Indicators: Aching Pain Intervention(s): Monitored during session;Repositioned    Home Living Family/patient expects to be discharged to:: Private residence Living Arrangements: Spouse/significant other Available Help at Discharge: Family Type of Home: House Home Access: Stairs to enter   Technical brewer of Steps: 3   Home Equipment: Walker - 2 wheels;Grab bars - toilet;Grab bars - tub/shower;Shower seat      Prior Function Level of Independence: Independent with assistive device(s)  Comments: pt reports typically ambulates with RW; reports no assist for ADL, 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 consultant        Extremity/Trunk Assessment   Upper Extremity Assessment Upper Extremity Assessment: Generalized weakness    Lower Extremity Assessment Lower Extremity Assessment: Generalized weakness    Cervical / Trunk Assessment Cervical / Trunk Assessment: Kyphotic   Communication   Communication: HOH  Cognition Arousal/Alertness: Awake/alert Behavior During Therapy: WFL for tasks assessed/performed Overall Cognitive Status: Within Functional Limits for tasks assessed                                 General Comments: appears WFL, pt HOH       General Comments      Exercises     Assessment/Plan    PT Assessment Patient needs continued PT services  PT Problem List Decreased strength;Decreased mobility;Decreased activity tolerance;Decreased balance;Decreased knowledge of use of DME;Cardiopulmonary status limiting activity       PT Treatment Interventions DME instruction;Therapeutic activities;Gait training;Therapeutic exercise;Patient/family education;Stair training;Functional mobility training;Balance training    PT Goals (Current goals can be found in the Care Plan section)  Acute Rehab PT Goals Patient Stated Goal: home soon, maintain his independence PT Goal Formulation: With patient Time For Goal Achievement: 01/31/19 Potential to Achieve Goals: Good    Frequency Min 3X/week   Barriers to discharge        Co-evaluation               AM-PAC PT "6 Clicks" Mobility  Outcome Measure Help needed turning from your back to your side while in a flat bed without using bedrails?: A Little Help needed moving from lying on your back to sitting on the side of a flat bed without using bedrails?: A Little Help needed moving to and from a bed to a chair (including a wheelchair)?: A Little Help needed standing up from a chair using your arms (e.g., wheelchair or bedside chair)?: A Little Help needed to walk in hospital room?: A Lot Help needed climbing 3-5 steps with a railing? : A Lot 6 Click Score: 16    End of Session Equipment Utilized During Treatment: Oxygen Activity Tolerance: Patient tolerated treatment well Patient left: with call bell/phone within reach;Other (comment)(on BSC per his request, agreeable to use  call bell for assist (no falls!)) Nurse Communication: Mobility status PT Visit Diagnosis: Other abnormalities of gait and mobility (R26.89)    Time: 7510-2585 PT Time Calculation (min) (ACUTE ONLY): 27 min   Charges:   PT Evaluation $PT Eval Low Complexity: [place], PT, DPT Acute Rehabilitation Services Office: [phone] Pager: [phone]   Trena Platt 01/17/2019, 2:52 PM

## 2019-01-17 NOTE — TOC Initial Note (Signed)
Transition of Care [facility]) - Initial/Assessment Note    Patient Details  Name: Gary Valencia MRN: [id number] Date of Birth: February 23, 1923  Transition of Care (TOC) CM/SW Contact:    Joaquin Courts, RN Phone Number: 01/17/2019, 2:18 PM  Clinical Narrative:   CM spoke with patient who reports he has [place] at home, but could not remember the name of the agency.  CM verified pt is active with [facility] for PT/OT/RN. [facility] rep, Cassie, made aware and will follow for d/c needs.                  Expected Discharge Plan: Home/Self Care Barriers to Discharge: Continued Medical Work up   Patient Goals and CMS Choice Patient states their goals for this hospitalization and ongoing recovery are:: go home      Expected Discharge Plan and Services Expected Discharge Plan: Home/Self Care   Discharge Planning Services: CM Consult   Living arrangements for the past 2 months: Single Family Home                           HH Arranged: PT, RN, OT HH Agency: [facility] Date [place]: 01/17/19 Time [place]: 1417 Representative spoke with at [place] Arrangements/Services Living arrangements for the past 2 months: [place] with:: Spouse Patient language and need for interpreter reviewed:: Yes Do you feel safe going back to the place where you live?: Yes      Need for Family Participation in Patient Care: Yes (Comment) Care giver support system in place?: Yes (comment) Current home services: Home OT, Home PT, Home RN Criminal Activity/Legal Involvement Pertinent to Current Situation/Hospitalization: No - Comment as needed  Activities of Daily Living Home Assistive Devices/Equipment: Gilford Rile (specify type) ADL Screening (condition at time of admission) Patient's cognitive ability adequate to safely complete daily activities?: No Is the patient deaf or have difficulty hearing?: No Does the patient have  difficulty seeing, even when wearing glasses/contacts?: No Does the patient have difficulty concentrating, remembering, or making decisions?: No Patient able to express need for assistance with ADLs?: Yes Does the patient have difficulty dressing or bathing?: Yes Independently performs ADLs?: Yes (appropriate for developmental age) Does the patient have difficulty walking or climbing stairs?: Yes Weakness of Legs: None Weakness of Arms/Hands: None  Permission Sought/Granted Permission sought to share information with : Facility Art therapist granted to share information with : Yes, Verbal Permission Granted     Permission granted to share info w AGENCY: [facility]        Emotional Assessment Appearance:: Appears stated age Attitude/Demeanor/Rapport: Engaged Affect (typically observed): Accepting Orientation: : Oriented to Self, Oriented to Place, Oriented to  Time, Oriented to Situation Alcohol / Substance Use: Never Used Psych Involvement: No (comment)  Admission diagnosis:  Acute respiratory failure with hypoxia ([place]) [J96.01] HCAP (healthcare-associated pneumonia) [J18.9] Patient Active Problem List   Diagnosis Date Noted  . SOB (shortness of breath)   . Acute respiratory failure with hypoxia ([place]) 01/16/2019  . HCAP (healthcare-associated pneumonia) 01/16/2019  . Pressure injury of skin 01/08/2019  . Severe anemia 01/05/2019  . Anemia 01/05/2019  . Herpes labialis 09/08/2017  . Personal history of colonic polyps   . Dysrhythmia   . Dyslipidemia   . Diverticulosis   . Atrial fibrillation ([place])   . Anxiety   . Actinic keratoses   . AAA (abdominal aortic aneurysm) without  rupture ([place]) 12/11/2015  . CAD - S/P CABG in 1984 09/13/2012  . HTN (hypertension) 09/13/2012  . Chronic anticoagulation -Warfarin 09/13/2012  . Palpitations 09/13/2012  . Atrial fibrillation, permanent 10/11/2011  . ASHD (arteriosclerotic heart disease) 10/11/2011  . History of  prostate cancer 10/11/2011  . AAA (4.09 cm on last [place] -2013) 10/11/2011  . Hyperlipidemia 10/11/2011  . Cancer ([place]) 08/23/1997   PCP:  [name], MD Pharmacy:   [facility] - [address] [place] [address] Phone: [phone] Fax: [phone]  [facility] [place], [place] [address] [place] [address] Phone: [phone] Fax: [phone]  [place], [place] [address] Phone: [phone] Fax: [phone]     Social Determinants of Health (SDOH) Interventions    Readmission Risk Interventions No flowsheet data found.

## 2019-01-18 DIAGNOSIS — T17908S Unspecified foreign body in respiratory tract, part unspecified causing other injury, sequela: Secondary | ICD-10-CM

## 2019-01-18 DIAGNOSIS — T17908A Unspecified foreign body in respiratory tract, part unspecified causing other injury, initial encounter: Secondary | ICD-10-CM

## 2019-01-18 DIAGNOSIS — Z515 Encounter for palliative care: Secondary | ICD-10-CM

## 2019-01-18 DIAGNOSIS — Z7189 Other specified counseling: Secondary | ICD-10-CM

## 2019-01-18 LAB — BASIC METABOLIC PANEL
Anion gap: 9 (ref 5–15)
BUN: 20 mg/dL (ref 8–23)
CO2: 29 mmol/L (ref 22–32)
Calcium: 8.6 mg/dL — ABNORMAL LOW (ref 8.9–10.3)
Chloride: 103 mmol/L (ref 98–111)
Creatinine, Ser: 1.13 mg/dL (ref 0.61–1.24)
GFR calc Af Amer: >60 mL/min (ref >60)
GFR calc non Af Amer: 55 mL/min — ABNORMAL LOW (ref >60)
Glucose, Bld: 86 mg/dL (ref 70–99)
Potassium: 3.9 mmol/L (ref 3.5–5.1)
Sodium: 141 mmol/L (ref 135–145)

## 2019-01-18 LAB — TYPE AND SCREEN
ABO/RH(D): O NEG
Antibody Screen: NEGATIVE
Unit division: 0

## 2019-01-18 LAB — CBC
HCT: 33 % — ABNORMAL LOW (ref 39.0–52.0)
Hemoglobin: 9.8 g/dL — ABNORMAL LOW (ref 13.0–17.0)
MCH: 28 pg (ref 26.0–34.0)
MCHC: 29.7 g/dL — ABNORMAL LOW (ref 30.0–36.0)
MCV: 94.3 fL (ref 80.0–100.0)
Platelets: 380 10*3/uL (ref 150–400)
RBC: 3.5 MIL/uL — ABNORMAL LOW (ref 4.22–5.81)
RDW: 17.1 % — ABNORMAL HIGH (ref 11.5–15.5)
WBC: 7.3 10*3/uL (ref 4.0–10.5)
nRBC: 0 % (ref 0.0–0.2)

## 2019-01-18 LAB — MAGNESIUM: Magnesium: 2 mg/dL (ref 1.7–2.4)

## 2019-01-18 LAB — BPAM RBC
Blood Product Expiration Date: 202006102359
ISSUE DATE / TIME: 202005271659
Unit Type and Rh: 9500

## 2019-01-18 LAB — PROTIME-INR
INR: 5.9 (ref 0.8–1.2)
Prothrombin Time: 52 seconds — ABNORMAL HIGH (ref 11.4–15.2)

## 2019-01-18 LAB — GLUCOSE, CAPILLARY: Glucose-Capillary: 72 mg/dL (ref 70–99)

## 2019-01-18 LAB — LEGIONELLA PNEUMOPHILA SEROGP 1 UR AG: L. pneumophila Serogp 1 Ur Ag: NEGATIVE

## 2019-01-18 MED ORDER — METOPROLOL TARTRATE 25 MG PO TABS
12.5000 mg | ORAL_TABLET | Freq: Every day | ORAL | Status: DC
  Administered 2019-01-19: 12.5 mg via ORAL
  Filled 2019-01-18: qty 1

## 2019-01-18 MED ORDER — GUAIFENESIN 100 MG/5ML PO SOLN
30.0000 mL | Freq: Four times a day (QID) | ORAL | Status: DC
  Administered 2019-01-18 – 2019-01-19 (×2): 600 mg via ORAL
  Filled 2019-01-18: qty 20
  Filled 2019-01-18: qty 10
  Filled 2019-01-18: qty 30

## 2019-01-18 NOTE — Consult Note (Signed)
Consultation Note Date: 01/18/2019   Patient Name: Gary Valencia  DOB: 08/02/1923  MRN: [id number]  Age / Sex: 85 y.o., male  PCP: [name], MD Referring Physician: [name], MD  Reason for Consultation: Establishing goals of care and Psychosocial/spiritual support  HPI/Patient Profile: 85 y.o. male  with past medical history of AAA, CAD s/p CABG 1984, afib on chronic coumadin who was admitted on 01/16/2019 with asymptomatic hypoxia. Recently admitted 01/05/19 for symptomatic anemia and CAP. Repeat CXR this admission showed worsening pneumonia with possible concomitant CHF exacerbation.   Clinical Assessment and Goals of Care:  We reviewed medical records including EPIC notes, labs and imaging, assessed the patient and then met at the bedside  to discuss diagnosis prognosis, GOC, EOL wishes, disposition and options.  I introduced Palliative Medicine as specialized medical care for people living with serious illness. It focuses on providing relief from the symptoms and stress of a serious illness. The goal is to improve quality of life for both the patient and the family.  We discussed a brief life review of the patient. He previously worked in Psychologist, educational for [employer]. He lives on a farm with his wife in [place]. He has one son that lives nearby and checks in on him frequently.   As far as functional and nutritional status, Mr. Lieurance is still very active. He makes an effort to walk every day, still mows his yard and takes care of any tasks that need to be done around the house. He has home health nurses that come out twice a week to check in on him which he finds helpful.   He has fairly good insight about his health. He notes that difficulty swallowing has been a recurrent issue for him since a neck surgery several years ago. He has learned to eat certain textured foods and has been trained  by SLP on techniques to try to prevent aspiration. We touched on  the difficulties managing the sequelae of frequent aspirations and the strain it can have on the heart and lungs.   Mr. Iannello notes that he has advanced directives and GOC in place. He is clear that his wishes are for full scope of care. Code status discussions were undertaken, difference between full code and DNR DNI explained in detail. The patient is alert, oriented and decisional and states that he would want CPR and other ACLS protocol attempted.   He looks forward to returning home as soon as possible to be with his wife.   Outpatient palliative services were explained and discussed with his son which he is agreeable to. Call placed and discussed with his son in detail.   Primary Decision Maker:  PATIENT    SUMMARY OF RECOMMENDATIONS   - Continue full scope of care - continue education on aspiration prevention - will refer for outpatient palliative services at discharge: home with home health care and outpatient [facility] to be set up please.   Code Status/Advance Care  Planning:  Full code  Additional Recommendations (Limitations, Scope, Preferences):  Full Scope Treatment  Palliative Prophylaxis:   Aspiration  Prognosis:   Unable to be determined; he is at increased risk for recurrent aspiration   Discharge Planning: Home with Palliative Services      Primary Diagnoses: Present on Admission: . Acute respiratory failure with hypoxia ([place]) . Severe anemia . Hyperlipidemia . HTN (hypertension) . Dyslipidemia . CAD - S/P CABG in 1984 . Atrial fibrillation, permanent   I have reviewed the medical record, interviewed the patient and family, and examined the patient. The following aspects are pertinent.  Past Medical History:  Diagnosis Date  . AAA (abdominal aortic aneurysm) ([place])   . Actinic keratoses   . Anxiety   . ASHD  (arteriosclerotic heart disease) 1984  . Atrial fibrillation ([place])   . Cancer ([place]) 1999   PROSTATE  . CVA (cerebral vascular accident) ([place])   . Diverticulosis   . Dyslipidemia   . Dysrhythmia   . Personal history of colonic polyps    Social History   Socioeconomic History  . Marital status: Married    Spouse name: Not on file  . Number of children: Not on file  . Years of education: Not on file  . Highest education level: Not on file  Occupational History  . Not on file  Social Needs  . Financial resource strain: Not on file  . Food insecurity:    Worry: Not on file    Inability: Not on file  . Transportation needs:    Medical: Not on file    Non-medical: Not on file  Tobacco Use  . Smoking status: Never Smoker  . Smokeless tobacco: Never Used  Substance and Sexual Activity  . Alcohol use: No  . Drug use: No  . Sexual activity: Not on file  Lifestyle  . Physical activity:    Days per week: Not on file    Minutes per session: Not on file  . Stress: Not on file  Relationships  . Social connections:    Talks on phone: Not on file    Gets together: Not on file    Attends religious service: Not on file    Active member of club or organization: Not on file    Attends meetings of clubs or organizations: Not on file    Relationship status: Not on file  Other Topics Concern  . Not on file  Social History Narrative  . Not on file   Family History  Problem Relation Age of Onset  . Arthritis Mother   . Heart disease Father   . AAA (abdominal aortic aneurysm) Brother    Scheduled Meds: . sodium chloride   Intravenous Once  . sodium chloride   Intravenous Once  . furosemide  40 mg Intravenous Daily  . guaiFENesin  1,200 mg Oral BID  . metoprolol tartrate  12.5 mg Oral BID  . pantoprazole  40 mg Oral BID  . rosuvastatin  10 mg Oral QODAY  . sodium chloride flush  3 mL Intravenous Q12H  . Warfarin - Pharmacist Dosing Inpatient   Does not apply q1800    Continuous Infusions: . sodium chloride    . ampicillin-sulbactam (UNASYN) IV 3 g (01/18/19 0516)   PRN Meds:.sodium chloride, acetaminophen **OR** acetaminophen, albuterol, bisacodyl, ondansetron **OR** ondansetron (ZOFRAN) IV, Resource ThickenUp Clear, senna-docusate, sodium chloride flush, traMADol Allergies  Allergen Reactions  . Asa Buff (Mag [Buffered Aspirin] Nausea Only  . Zetia [Ezetimibe] Other (See  Comments)    Can not remember reaction   Review of Systems Positive for dysphagia, cough, lower extremity edema  Physical Exam   General: awake, alert, pleasant elderly gentleman sitting up in bed in NAD Pulm: normal work of breathing; saturating on 2L [place]; lungs with scattered rhonchi  Trace edema S 1 S 2 Abdomen is not distended  Vital Signs: BP 117/65 (BP Location: Right Arm)   Pulse 65   Temp 97.8 F (36.6 C) (Oral)   Resp 16   Ht '5\' 7"'  (1.702 m)   Wt 62.2 kg   SpO2 94%   BMI 21.49 kg/m  Pain Scale: 0-10   Pain Score: 0-No pain   SpO2: SpO2: 94 % O2 Device:SpO2: 94 % O2 Flow Rate: .O2 Flow Rate (L/min): 2 L/min  IO: Intake/output summary:   Intake/Output Summary (Last 24 hours) at 01/18/2019 5993 Last data filed at 01/18/2019 5701 Gross per 24 hour  Intake 360 ml  Output 2130 ml  Net -1770 ml    LBM: Last BM Date: 01/17/19 Baseline Weight: Weight: 59 kg Most recent weight: Weight: 62.2 kg     Palliative Assessment/Data: 80%   Flowsheet Rows     Most Recent Value  Intake Tab  Referral Department  Hospitalist  Unit at Time of Referral  Med/Surg Unit  Date Notified  01/18/19  Palliative Care Type  New Palliative care  Reason for referral  Clarify Goals of Care  Date of Admission  01/16/19  # of days IP prior to Palliative referral  2  Clinical Assessment  Psychosocial & Spiritual Assessment  Palliative Care Outcomes     PPS 50%  Time In: 8 Time Out: 9:10 Time Total: 70 min  Visit consisted of counseling and education dealing with the  complex and emotionally intense issues surrounding the need for palliative care and symptom management in the setting of serious and potentially life-threatening illness. Greater than 50%  of this time was spent counseling and coordinating care related to the above assessment and plan.  The patient was seen and examined and this note was prepared alongside PGY1 resident [facility], DO.   Signed by: Loistine Chance MD [phone]   Please contact Palliative Medicine Team phone at [phone] for questions and concerns.  For individual provider: See Shea Evans

## 2019-01-18 NOTE — Progress Notes (Signed)
Progress Note  Patient Name: Gary Valencia Date of Encounter: 01/18/2019  Primary Cardiologist: Sanda Klein, MD   Subjective   Patient reports feeling better this morning and denies SOB, palpitations, or chest pain.   Inpatient Medications    Scheduled Meds: . sodium chloride   Intravenous Once  . sodium chloride   Intravenous Once  . furosemide  40 mg Intravenous Daily  . guaiFENesin  1,200 mg Oral BID  . metoprolol tartrate  12.5 mg Oral BID  . pantoprazole  40 mg Oral BID  . rosuvastatin  10 mg Oral QODAY  . sodium chloride flush  3 mL Intravenous Q12H  . Warfarin - Pharmacist Dosing Inpatient   Does not apply q1800   Continuous Infusions: . sodium chloride    . ampicillin-sulbactam (UNASYN) IV 3 g (01/18/19 0516)   PRN Meds: sodium chloride, acetaminophen **OR** acetaminophen, albuterol, bisacodyl, ondansetron **OR** ondansetron (ZOFRAN) IV, Resource ThickenUp Clear, senna-docusate, sodium chloride flush, traMADol   Vital Signs    Vitals:   01/17/19 1651 01/17/19 1721 01/17/19 2020 01/18/19 0524  BP: 105/61 (!) 116/58 118/62 117/65  Pulse: 67 72 74 65  Resp: 16 16 15 16   Temp: 97.6 F (36.4 C) 97.6 F (36.4 C) (!) 97.4 F (36.3 C) 97.8 F (36.6 C)  TempSrc: Oral Oral Oral Oral  SpO2: 94% 93% 100% 94%  Weight:      Height:        Intake/Output Summary (Last 24 hours) at 01/18/2019 0830 Last data filed at 01/18/2019 5465 Gross per 24 hour  Intake 360 ml  Output 2130 ml  Net -1770 ml   Filed Weights   01/16/19 1510 01/16/19 1951 01/17/19 0436  Weight: 59 kg 63.2 kg 62.2 kg    Telemetry    Atrial fibrillation with CVR and occasional PVCs - Personally Reviewed  Physical Exam   GEN: Elderly gentleman sitting upright in bed in no acute distress.   Neck: No JVD, no carotid bruits Cardiac: IRIR, no murmur, rubs or gallops.  Respiratory: mild rhonchi at lung bases, otherwise CTAB GI: NABS, Soft, nontender, non-distended  MS: No edema; No  deformity. Neuro:  Nonfocal, moving all extremities spontaneously Psych: Normal affect   Labs    Chemistry Recent Labs  Lab 01/16/19 1550 01/17/19 0522 01/18/19 0552  NA 141 140 141  K 4.3 4.3 3.9  CL 109 108 103  CO2 25 26 29   GLUCOSE 109* 95 86  BUN 21 19 20   CREATININE 1.12 1.10 1.13  CALCIUM 8.5* 8.1* 8.6*  PROT  --  6.6  --   ALBUMIN  --  2.3*  --   AST  --  39  --   ALT  --  20  --   ALKPHOS  --  126  --   BILITOT  --  0.7  --   GFRNONAA 56* 57* 55*  GFRAA >60 >60 >60  ANIONGAP 7 6 9      Hematology Recent Labs  Lab 01/16/19 1550 01/17/19 0522 01/18/19 0552  WBC 7.2 6.7 7.3  RBC 2.96* 2.85*  2.85* 3.50*  HGB 8.3* 7.7* 9.8*  HCT 28.6* 27.3* 33.0*  MCV 96.6 95.8 94.3  MCH 28.0 27.0 28.0  MCHC 29.0* 28.2* 29.7*  RDW 17.9* 17.8* 17.1*  PLT 374 348 380    Cardiac Enzymes Recent Labs  Lab 01/16/19 1550  TROPONINI 0.05*   No results for input(s): TROPIPOC in the last 168 hours.   BNP Recent Labs  Lab  01/16/19 1550  BNP 494.8*     DDimer No results for input(s): DDIMER in the last 168 hours.   Radiology    X-ray Chest Pa And Lateral  Result Date: 01/17/2019 CLINICAL DATA:  SOB EXAM: CHEST - 2 VIEW COMPARISON:  01/16/19, CT 10/07/2015 FINDINGS: Sternal wires overlie normal cardiac silhouette. Scattered bilateral fine airspace disease similar prior. Low lung volumes. No pneumothorax. Lateral projection demonstrates chronic compression fracture at the thoracolumbar junction with kyphosis. Abdominal stent graft noted IMPRESSION: Bilateral patchy airspace disease representing multifocal pneumonia versus pulmonary edema. No significant change from 1 day prior. Electronically Signed   By: Suzy Bouchard M.D.   On: 01/17/2019 09:21   Dg Chest Port 1 View  Result Date: 01/16/2019 CLINICAL DATA:  Low oxygen saturation.  Shortness of breath. EXAM: PORTABLE CHEST 1 VIEW COMPARISON:  01/05/2019.  09/02/2017. FINDINGS: Previous median sternotomy. The patient  has a background pattern mild chronic pulmonary scarring at the bases. There is diffuse pulmonary hazy opacity which has worsened further since the study of 11 days ago, consistent with pneumonia. There is some volume loss and consolidation in the left lower lobe. There may be some pleural fluid on left. Findings are presumed secondary to pneumonia, though there could be an element of fluid overload/edema. IMPRESSION: Continued worsening of diffuse pulmonary density. Left lower lobe density and probable left effusion. Findings could relate to a combination of pneumonia and heart failure. Electronically Signed   By: Nelson Chimes M.D.   On: 01/16/2019 15:49    Cardiac Studies   Echocardiogram 01/17/2019: IMPRESSIONS    1. The left ventricle has low normal systolic function, with an ejection fraction of 50-55%. The cavity size was normal. There is mild concentric left ventricular hypertrophy. Left ventricular diastolic Doppler parameters are consistent with  pseudonormalization. Indeterminate filling pressures.  2. The right ventricle has normal systolic function. The cavity was normal. There is no increase in right ventricular wall thickness.  3. Left atrial size was severely dilated.  4. Right atrial size was severely dilated.  5. No evidence of mitral valve stenosis.  6. Mild thickening of the aortic valve. Mild calcification of the aortic valve. Aortic valve regurgitation is trivial by color flow Doppler. No stenosis of the aortic valve.  7. The ascending aorta is normal in size and structure.  8. The inferior vena cava was dilated in size with >50% respiratory variability.  Patient Profile     85 y.o. male with a PMH of CAD s/p CABG in 1984, permanent atrial fibrillation on coumadin, HLD, AAA s/p repair 11/2015, ?recent CVA, and prostate cancer, who is being followed by cardiology for the evaluation of CHF  Assessment & Plan    1. Acute respiratory failure: patient presented with  asymptomatic hypoxia with O2 sat in the 70s at home. Recently admitted 01/05/2019-01/08/2019 for anemia and CAP. CXR this admission with PNA and possible CHF. BNP 494 (563 01/05/2019). He was started on broad spectrum antibiotics for HCAP and gentle diuresis with IV lasix. UOP with net -1.7L in the past 24 hours and -2.3L this admission. Weight 139>137lbs. His albumin is also low which could contribute to fluid retention. Echo without significant change from previous. Likely multifactorial though suspect HCAP > underlying diastolic CHF.  - Will stop lasix at this time. Do not think he will need this at discharge. - Continue to monitor daily weights and strict I&Os - Continue to monitor electrolytes and replete as needed to maintain K>4, Mg>2 - Continue antibiotics  and supportive care per primary team  2. Elevated troponin: trop 0.05 on admission, no trend. EKG without ischemic changes. Patient has no complaints of chest pain or DOE. Suspect mild elevation is demand ischemia in the setting a HCAP and CHF. Echo without significant change from previous - No further ischemic evaluation at this time  3. Anemia: patient was recently admitted 01/05/2019-01/08/2019 with anemia and melena (-FOBT). He received 1 uPRBC yesterday with improvement in Hgb from 7.7>9.8. INR remains supratherapeutic at 5.9 today. Labs c/w iron deficiency anemia with unclear source. He was started on IV iron as well.  - Continue anemia work-up per primary team - Do not feel comfortable restarting anticoagulation at this point given recent/recurrent anemia requiring transfusions. Will defer to outpatient if/when to restart - Transfuse to maintain Hgb of 8 given CAD/CABG history  4. CAD s/p CABG in 1984: no anginal complaints. Not on ASA given need for coumadin for atrial fibrillation.  - Continue metoprolol and statin  5. Permanent atrial fibrillation: rate well controlled this admission. His INR remains supratherapeutic to 5.9 on  labs today. Coumadin on hold. Do not feel comfortable restarting anticoagulation at this point given recent/recurrent anemia requiring transfusions and difficulty maintaining therapeutic INRs. - Will defer to outpatient if/when to restart. Need to have a risks/benefits discussion given ongoing anemia with high risk for stroke with afib. - If Hgb remain stable, could consider eliquis 2.5mg  BID dosing for stroke ppx - Continue metoprolol for rate control  6. HLD: LDL 82 on labs 11/2017 - Continue statin  7. AAA s/p EVAR 11/2015: he had small type 2 endoleak post repair which was not seen on duplex 09/2018.  - Continue routine outpatient monitoring.   For questions or updates, please contact [place] Please consult [email or web address] for contact info under Cardiology/STEMI.      Signed, Abigail Butts, PA-C  01/18/2019, 8:30 AM   [phone]

## 2019-01-18 NOTE — Progress Notes (Signed)
PROGRESS NOTE    Gary Valencia  WCH:[id number] DOB: November 10, 1922 DOA: 01/16/2019 PCP: [name], MD  Brief Narrative: 85 y.o.malewith medical history significant ofAAA, history of actinic keratosis, history of anxiety, history of atherosclerotic heart disease with CAD status post CABG in 1984, history of permanent atrial fibrillation on chronic anticoagulation with warfarin, history of prostate cancer, hyperlipidemia, anxiety, history of anemia, and other comorbidities who presents with low oxygen saturation and shortness of muscle exertion. Of note patient was recently hospitalized 11days ago for symptomatic anemia and pneumonia and was discharged home on the 18th. Patient went home and he was doing relatively well but home health nurse came out today and noticed that his oxygen saturations were low. Patient was not symptomatic but he was directed to the emergency room for further evaluation. Patient did admit to having some shortness of breath on exertion but denies chest pain, lightheadedness or dizziness. Chest x-ray showed worsening pneumonia and possible concomitant CHF exacerbation and so patient was admitted for his acute respiratory failure with hypoxia. He was placed on broad-spectrum antibiotics for H CAP coverage and given gentle diuresis with IV 20 mg of Lasix. BNP was elevated on examination and will obtain an echocardiogram. TRH was called admit this patient and he was placed on medical telemetry.  ED Course:ED patient had basic blood work done and was given a dose of IV 20 mg of Lasix and was also placed on broad-spectrum antibiotics with vancomycin and Zosyn. Chest x-ray was also obtained. COVID testing was NEGATIVE   Assessment & Plan:   Active Problems:   Atrial fibrillation, permanent   History of prostate cancer   Hyperlipidemia   CAD - S/P CABG in 1984   HTN (hypertension)   Chronic anticoagulation -Warfarin   Personal history of colonic polyps  Dyslipidemia   Severe anemia   Acute respiratory failure with hypoxia (HCC)   HCAP (healthcare-associated pneumonia)   SOB (shortness of breath)   Aspiration into airway   Palliative care by specialist   Goals of care, counseling/discussion   AcuteRespiratoryFailure withHypoxia in the setting of HCAP pneumoniavs aspiration pneumonia -Patient was saturating in the 70s on room air at home; staff reports patient was coughing upon drinking water. -Pulse oximetry and maintain O2 saturations greater than 92% -Continue supplemental oxygen via nasal cannula and wean as tolerated -BNP on Admission was 494.8 and down from previous; continue Lasix 40 mg daily.  Continue IV antibiotics.  If he is truly aspirating I will change his antibiotics to Unasyn.-Continue Unasyn if he remains afebrile and stable plan on discharge home tomorrow with home PT.  Taper oxygen to keep his saturation above 92%. -CXRon admission showed "Previous median sternotomy. The patient has a background pattern mild chronic pulmonary scarring at the bases. There is diffuse pulmonary hazy opacity which has worsened further since the study of 11 days ago, consistent with pneumonia. There is some volume loss and consolidation in the left lower lobe. There may be some pleural fluid on left. Findings are presumed secondary to pneumonia, though there could be an element of fluid overload/edema" -SARS-CoV-2 test isNegative this Admission; Las one 11 days ago was Negative  -Repeat CXR bilateral patchy airspace disease representing multifocal pneumonia versus pulmonary edema.  No significant change from yesterday.  Elevated BNP with Concern for Acute diastolic CHF Exacerbation? -Last ECHOCardigoram was done in 2006 follow-up echo ejection fraction 50 to 55% cavity size normal mild concentric LVH -BNP was 494.8 and last visit was 563.8.  Cardiology  has stopped Lasix and will not restart this upon discharge.  Elevated Troponin -Mild at  0.05 and likely Demand Ischemia  -EKG showedatrial fibrillation  NormocyticAnemia -Patient's hemoglobin/hematocrit upon admission was 8.3 down to 7.7 today with no evidence of active bleeding.  He got 1 unit of blood transfusion hemoglobin up to about 9.  Will not restart Coumadin. -Recently had some GI bleeding/symptomatic anemia so we will continue with Protonix 40 mg p.o. twice daily -Repeat CBC in a.m   PermanentAtrialFibrillation -Patient was atrial fibrillation on admission rates in the 70s -Continue with home metoprolol   Hyperlipidemia/Dyslipidemia -Continue with home statin  Hypeglycemia -Patient's blood sugar on admission was 109 -Questionable is reactive -Check hemoglobin A1c in theAM  AAA -Will need to follow up on U/S as outpatient  DVT prophylaxis:Anticoagulated with Warfarin Code Status:FULL CODE Family Communication: Discussed with his wife she told me that we must communicate with his son [name], [name]. at [phone].  Discussed with son  disposition Plan:Remain inpatient for treatment for pneumonia and CHF Consults called:Notified cards manager via epic    Pressure Injury 01/07/19 Stage I -  Intact skin with non-blanchable redness of a localized area usually over a bony prominence. reddened area over boney prominance; Allevyn intact (Active)  01/07/19 1145  Location: Vertebral column  Location Orientation: Mid;Upper  Staging: Stage I -  Intact skin with non-blanchable redness of a localized area usually over a bony prominence.  Wound Description (Comments): reddened area over boney prominance; Allevyn intact  Present on Admission: No    Estimated body mass index is 21.49 kg/m as calculated from the following:   Height as of this encounter: 5\' 7"  (1.702 m).   Weight as of this encounter: 62.2 kg.   Subjective: Resting in bed on 2 lit of oxygen no new c/o  Objective: Vitals:   01/17/19 1651 01/17/19 1721 01/17/19 2020 01/18/19  0524  BP: 105/61 (!) 116/58 118/62 117/65  Pulse: 67 72 74 65  Resp: 16 16 15 16   Temp: 97.6 F (36.4 C) 97.6 F (36.4 C) (!) 97.4 F (36.3 C) 97.8 F (36.6 C)  TempSrc: Oral Oral Oral Oral  SpO2: 94% 93% 100% 94%  Weight:      Height:        Intake/Output Summary (Last 24 hours) at 01/18/2019 1301 Last data filed at 01/18/2019 1005 Gross per 24 hour  Intake 600 ml  Output 2130 ml  Net -1530 ml   Filed Weights   01/16/19 1510 01/16/19 1951 01/17/19 0436  Weight: 59 kg 63.2 kg 62.2 kg    Examination:  General exam: Appears calm and comfortable  Respiratory system: scattered rhonchi  to auscultation. Respiratory effort normal. Cardiovascular system: S1 & S2 heard, RRR. No JVD, murmurs, rubs, gallops or clicks. No pedal edema. Gastrointestinal system: Abdomen is nondistended, soft and nontender. No organomegaly or masses felt. Normal bowel sounds heard. Central nervous system: Alert and oriented. No focal neurological deficits. Extremities: Symmetric 5 x 5 power. Skin: No rashes, lesions or ulcers Psychiatry: Judgement and insight appear normal. Mood & affect appropriate.     Data Reviewed: I have personally reviewed following labs and imaging studies  CBC: Recent Labs  Lab 01/16/19 1550 01/17/19 0522 01/18/19 0552  WBC 7.2 6.7 7.3  NEUTROABS 5.8 5.4  --   HGB 8.3* 7.7* 9.8*  HCT 28.6* 27.3* 33.0*  MCV 96.6 95.8 94.3  PLT 374 348 213   Basic Metabolic Panel: Recent Labs  Lab 01/16/19 1550 01/16/19 2041  01/17/19 0522 01/18/19 0552  NA 141  --  140 141  K 4.3  --  4.3 3.9  CL 109  --  108 103  CO2 25  --  26 29  GLUCOSE 109*  --  95 86  BUN 21  --  19 20  CREATININE 1.12  --  1.10 1.13  CALCIUM 8.5*  --  8.1* 8.6*  MG  --  2.1 2.3 2.0  PHOS  --  3.6 4.1  --    GFR: Estimated Creatinine Clearance: 35.2 mL/min (by C-G formula based on SCr of 1.13 mg/dL). Liver Function Tests: Recent Labs  Lab 01/17/19 0522  AST 39  ALT 20  ALKPHOS 126  BILITOT  0.7  PROT 6.6  ALBUMIN 2.3*   No results for input(s): LIPASE, AMYLASE in the last 168 hours. No results for input(s): AMMONIA in the last 168 hours. Coagulation Profile: Recent Labs  Lab 01/12/19 01/16/19 2041 01/17/19 0522 01/18/19 0552  INR 3.0 6.1* 6.4* 5.9*   Cardiac Enzymes: Recent Labs  Lab 01/16/19 1550  TROPONINI 0.05*   BNP (last 3 results) No results for input(s): PROBNP in the last 8760 hours. HbA1C: Recent Labs    01/16/19 2041  HGBA1C 5.3   CBG: Recent Labs  Lab 01/17/19 0714 01/17/19 0752 01/18/19 0715  GLUCAP 65* 72 72   Lipid Profile: No results for input(s): CHOL, HDL, LDLCALC, TRIG, CHOLHDL, LDLDIRECT in the last 72 hours. Thyroid Function Tests: Recent Labs    01/16/19 2041  TSH 4.125  FREET4 1.00   Anemia Panel: Recent Labs    01/17/19 0522  VITAMINB12 555  FOLATE 20.5  FERRITIN 89  TIBC 240*  IRON 20*  RETICCTPCT 1.9   Sepsis Labs: Recent Labs  Lab 01/16/19 1605 01/16/19 2041  LATICACIDVEN 1.2 1.5    Recent Results (from the past 240 hour(s))  SARS Coronavirus 2 (CEPHEID - Performed in [facility] hospital lab), Hosp Order     Status: None   Collection Time: 01/16/19  3:22 PM  Result Value Ref Range Status   SARS Coronavirus 2 NEGATIVE NEGATIVE Final    Comment: (NOTE) If result is NEGATIVE SARS-CoV-2 target nucleic acids are NOT DETECTED. The SARS-CoV-2 RNA is generally detectable in upper and lower  respiratory specimens during the acute phase of infection. The lowest  concentration of SARS-CoV-2 viral copies this assay can detect is 250  copies / mL. A negative result does not preclude SARS-CoV-2 infection  and should not be used as the sole basis for treatment or other  patient management decisions.  A negative result may occur with  improper specimen collection / handling, submission of specimen other  than nasopharyngeal swab, presence of viral mutation(s) within the  areas targeted by this assay, and  inadequate number of viral copies  (<250 copies / mL). A negative result must be combined with clinical  observations, patient history, and epidemiological information. If result is POSITIVE SARS-CoV-2 target nucleic acids are DETECTED. The SARS-CoV-2 RNA is generally detectable in upper and lower  respiratory specimens dur ing the acute phase of infection.  Positive  results are indicative of active infection with SARS-CoV-2.  Clinical  correlation with patient history and other diagnostic information is  necessary to determine patient infection status.  Positive results do  not rule out bacterial infection or co-infection with other viruses. If result is PRESUMPTIVE POSTIVE SARS-CoV-2 nucleic acids MAY BE PRESENT.   A presumptive positive result was obtained on the submitted specimen  and confirmed on repeat testing.  While 2019 novel coronavirus  (SARS-CoV-2) nucleic acids may be present in the submitted sample  additional confirmatory testing may be necessary for epidemiological  and / or clinical management purposes  to differentiate between  SARS-CoV-2 and other Sarbecovirus currently known to infect humans.  If clinically indicated additional testing with an alternate test  methodology [id number]) is advised. The SARS-CoV-2 RNA is generally  detectable in upper and lower respiratory sp ecimens during the acute  phase of infection. The expected result is Negative. Fact Sheet for Patients:  StrictlyIdeas.no Fact Sheet for Healthcare Providers: BankingDealers.co.za This test is not yet approved or cleared by the [place] FDA and has been authorized for detection and/or diagnosis of SARS-CoV-2 by FDA under an Emergency Use Authorization (EUA).  This EUA will remain in effect (meaning this test can be used) for the duration of the COVID-19 declaration under Section 564(b)(1) of the Act, 21 U.S.C. section 360bbb-3(b)(1), unless the  authorization is terminated or revoked sooner. Performed at [facility], [place] [address], [place], [place] [address]   Blood culture (routine x 2)     Status: None (Preliminary result)   Collection Time: 01/16/19  4:06 PM  Result Value Ref Range Status   Specimen Description   Final    BLOOD SITE NOT SPECIFIED Performed at [facility] [address] [place], [place] [address]    Special Requests   Final    BOTTLES DRAWN AEROBIC AND ANAEROBIC Blood Culture adequate volume Performed at [facility] [address], [place], [place] [address]    Culture   Final    NO GROWTH < 24 HOURS Performed at [facility] [address] [place], [place] [address]    Report Status PENDING  Incomplete  Blood culture (routine x 2)     Status: None (Preliminary result)   Collection Time: 01/16/19  4:16 PM  Result Value Ref Range Status   Specimen Description   Final    BLOOD SITE NOT SPECIFIED Performed at [facility] [address] [place], [place] [address]    Special Requests   Final    BOTTLES DRAWN AEROBIC AND ANAEROBIC Blood Culture adequate volume Performed at [facility] [address] [place], [place] [address]    Culture   Final    NO GROWTH < 24 HOURS Performed at [facility] [address] [place], [place] [address]    Report Status PENDING  Incomplete  MRSA PCR Screening     Status: None   Collection Time: 01/17/19 12:37 PM  Result Value Ref Range Status   MRSA by PCR NEGATIVE NEGATIVE Final    Comment:        The GeneXpert MRSA Assay (FDA approved for NASAL specimens only), is one component of a comprehensive MRSA colonization surveillance program. It is not intended to diagnose MRSA infection nor to guide or monitor treatment for MRSA infections. Performed at [facility], [place] [address] [place], [place] [address]           Radiology Studies: X-ray Chest Pa And Lateral  Result Date: 01/17/2019 CLINICAL DATA:  SOB EXAM: CHEST - 2 VIEW COMPARISON:  01/16/19, CT 10/07/2015 FINDINGS: Sternal wires overlie normal cardiac silhouette. Scattered bilateral fine airspace disease similar prior. Low lung volumes. No pneumothorax. Lateral projection demonstrates chronic compression fracture at the thoracolumbar junction with kyphosis. Abdominal stent graft noted IMPRESSION: Bilateral  patchy airspace disease representing multifocal pneumonia versus pulmonary edema. No significant change from 1 day prior. Electronically Signed   By: Suzy Bouchard M.D.   On: 01/17/2019 09:21   Dg Chest Port 1 View  Result Date: 01/16/2019 CLINICAL DATA:  Low oxygen saturation.  Shortness of breath. EXAM: PORTABLE CHEST 1 VIEW COMPARISON:  01/05/2019.  09/02/2017. FINDINGS: Previous median sternotomy. The patient has a background pattern mild chronic pulmonary scarring at the bases. There is diffuse pulmonary hazy opacity which has worsened further since the study of 11 days ago, consistent with pneumonia. There is some volume loss and consolidation in the left lower lobe. There may be some pleural fluid on left. Findings are presumed secondary to pneumonia, though there could be an element of fluid overload/edema. IMPRESSION: Continued worsening of diffuse pulmonary density. Left lower lobe density and probable left effusion. Findings could relate to a combination of pneumonia and heart failure. Electronically Signed   By: Nelson Chimes M.D.   On: 01/16/2019 15:49        Scheduled Meds: . sodium chloride   Intravenous Once  . sodium chloride   Intravenous Once  . guaiFENesin  1,200 mg Oral BID  . metoprolol tartrate  12.5 mg Oral BID  . pantoprazole  40 mg Oral BID  . rosuvastatin  10 mg Oral QODAY  . sodium chloride flush  3 mL Intravenous Q12H  . Warfarin - Pharmacist Dosing Inpatient   Does not apply q1800   Continuous Infusions: . sodium  chloride    . ampicillin-sulbactam (UNASYN) IV 3 g (01/18/19 0516)     LOS: 2 days     [name], MD [facility]  If 7PM-7AM, please contact night-coverage [email or web address] Password [email or web address] 01/18/2019, 1:01 PM

## 2019-01-18 NOTE — Progress Notes (Signed)
Patient BP 87/50.  HR 65.  Patient alert and oriented x4.  Dr. Rodena Piety aware.  Will continue to monitor patient.

## 2019-01-18 NOTE — Progress Notes (Signed)
Lead for warfarin Indication: hx atrial fibrillation  Allergies  Allergen Reactions  . Asa Buff (Mag [Buffered Aspirin] Nausea Only  . Zetia [Ezetimibe] Other (See Comments)    Can not remember reaction    Patient Measurements: Height: 5\' 7"  (170.2 cm) Weight: 137 lb 3.2 oz (62.2 kg) IBW/kg (Calculated) : 66.1 Heparin Dosing Weight:  Vital Signs: Temp: 97.8 F (36.6 C) (05/28 0524) Temp Source: Oral (05/28 0524) BP: 117/65 (05/28 0524) Pulse Rate: 65 (05/28 0524)  Labs: Recent Labs    01/16/19 1550 01/16/19 2041 01/17/19 0522 01/18/19 0552  HGB 8.3*  --  7.7* 9.8*  HCT 28.6*  --  27.3* 33.0*  PLT 374  --  348 380  LABPROT  --  52.9* 55.1* 52.0*  INR  --  6.1* 6.4* 5.9*  CREATININE 1.12  --  1.10 1.13  TROPONINI 0.05*  --   --   --     Estimated Creatinine Clearance: 35.2 mL/min (by C-G formula based on SCr of 1.13 mg/dL).   Medications:  PTA warfarin regimen: 2.5 mg daily except 1.25 mg on MWF (last dose taken on 5/26)  Assessment: Patient is a 85 y.o M with hx afib on warfarin PTA, presented to the ED with SOB.  Warfarin resumed on admission.  Today, 01/18/19  INR continues to be SUPRAtherapeutic x 3 days now  No reported bleeding  Hgb 9.8. Plts 380  Patient now starting to eat per charting  Goal of Therapy:  INR 2-3 Monitor platelets by anticoagulation protocol: Yes   Plan:  1) No warfarin again today  2) Daily INR 3) Monitor for s/s bleeding  Kara Mead 01/18/2019,1:14 PM

## 2019-01-18 NOTE — Plan of Care (Signed)

## 2019-01-19 LAB — PROTIME-INR
INR: 5.8 (ref 0.8–1.2)
Prothrombin Time: 51.4 seconds — ABNORMAL HIGH (ref 11.4–15.2)

## 2019-01-19 MED ORDER — BISACODYL 10 MG RE SUPP: 10.0000 mg | Freq: Every day | RECTAL | 0 refills | Status: AC | PRN

## 2019-01-19 MED ORDER — METOPROLOL TARTRATE 25 MG PO TABS: 12.5000 mg | ORAL_TABLET | Freq: Every day | ORAL | 0 refills | Status: AC

## 2019-01-19 MED ORDER — SENNOSIDES-DOCUSATE SODIUM 8.6-50 MG PO TABS: 1.0000 | ORAL_TABLET | Freq: Every evening | ORAL | Status: AC | PRN

## 2019-01-19 MED ORDER — FERROUS SULFATE 325 (65 FE) MG PO TABS: 325.0000 mg | ORAL_TABLET | Freq: Every day | ORAL | 3 refills | Status: AC

## 2019-01-19 MED ORDER — AMOXICILLIN-POT CLAVULANATE 875-125 MG PO TABS: 1.0000 | ORAL_TABLET | Freq: Two times a day (BID) | ORAL | 0 refills | Status: AC

## 2019-01-19 NOTE — Discharge Summary (Addendum)
Physician Discharge Summary  GERAN HAITHCOCK CVE:[id number] DOB: Mar 14, 1923 DOA: 01/16/2019  PCP: [name], MD  Admit date: 01/16/2019 Discharge date: 01/19/2019  Admitted From: home Disposition:  home  Recommendations for Outpatient Follow-up:  1. Follow up with PCP in 1-2 weeks 2. Please obtain BMP/CBC in one week 3. Follow up with cardiology  Home Health yes Equipment/Devices none Discharge Condition: Stable CODE STATUS full code Diet recommendation: Cardiac dysphagia 3 nectar thick liquid  Brief/Interim Summary: 85 y.o.malewith medical history significant ofAAA, history of actinic keratosis, history of anxiety, history of atherosclerotic heart disease with CAD status post CABG in 1984, history of permanent atrial fibrillation on chronic anticoagulation with warfarin, history of prostate cancer, hyperlipidemia, anxiety, history of anemia, and other comorbidities who presents with low oxygen saturation and shortness of muscle exertion. Of note patient was recently hospitalized 11days ago for symptomatic anemia and pneumonia and was discharged home on the 18th. Patient went home and he was doing relatively well but home health nurse came out today and noticed that his oxygen saturations were low. Patient was not symptomatic but he was directed to the emergency room for further evaluation. Patient did admit to having some shortness of breath on exertion but denies chest pain, lightheadedness or dizziness. Chest x-ray showed worsening pneumonia and possible concomitant CHF exacerbation and so patient was admitted for his acute respiratory failure with hypoxia. He was placed on broad-spectrum antibiotics for H CAP coverage and given gentle diuresis with IV 20 mg of Lasix. BNP was elevated on examination and will obtain an echocardiogram. TRH was called admit this patient and he was placed on medical telemetry.  ED Course:ED patient had basic blood work done and was given a  dose of IV 20 mg of Lasix and was also placed on broad-spectrum antibiotics with vancomycin and Zosyn. Chest x-ray was also obtained. COVID testing was NEGATIVE   Discharge Diagnoses:  Active Problems:   Atrial fibrillation, permanent   History of prostate cancer   Hyperlipidemia   CAD - S/P CABG in 1984   HTN (hypertension)   Chronic anticoagulation -Warfarin   Personal history of colonic polyps   Dyslipidemia   Severe anemia   Acute respiratory failure with hypoxia (HCC)   HCAP (healthcare-associated pneumonia)   SOB (shortness of breath)   Aspiration into airway   Palliative care by specialist   Goals of care, counseling/discussion   AcuteRespiratoryFailure withHypoxia in the setting of HCAP pneumoniavsaspiration pneumonia-Patient was treated with IV Unasyn for possible aspiration pneumonia.  Patient was seen by speech therapy as patient was coughing drinking water.  Patient is at high risk for aspiration.  They recommended dysphagia 3 diet/nectar thick liquids-CXRon admission showed "Previous median sternotomy. The patient has a background pattern mild chronic pulmonary scarring at the bases. There is diffuse pulmonary hazy opacity which has worsened further since the study of 11 days ago, consistent with pneumonia. There is some volume loss and consolidation in the left lower lobe. There may be some pleural fluid on left. Findings are presumed secondary to pneumonia, though there could be an element of fluid overload/edema" -SARS-CoV-2 test isNegative this Admission; Last one 11 days ago was Negative  -Repeat CXRbilateral patchy airspace disease representing multifocal pneumonia versus pulmonary edema Patient was seen by palliative care will arrange for outpatient palliative care follow-up.  Elevated BNP with Concern for Acute diastolic CHF Exacerbation? -Last ECHOCardigoram was done in 83follow-up echo ejection fraction 50 to 55% cavity size normal mild concentric  LVH -BNP  was 494.8 and last visit was 563.8.  Cardiology has stopped Lasix and will not restart this upon discharge.  I have also decreased his beta-blocker to 12.5 mg daily since his blood pressure dropped to 80s yesterday.  Elevated Troponin -Mild at 0.05 and likely Demand Ischemia  -EKG showedatrial fibrillation  NormocyticAnemia -Patient's hemoglobin/hematocritupon admission was 8.3 down to 7.7 the following day patient received 1 unit of blood transfusion and hemoglobin went up to 9.  Will not restart Coumadin.  He will follow-up with a cardiologist who will decide as an outpatient decide if he needs to be on low-dose oral anticoagulation.-Recently had some GI bleeding/symptomatic anemia so we will continue with Protonix 40 mg p.o. twice daily  PermanentAtrialFibrillation -Patient was atrial fibrillation on admission rates in the 70s -Continue with home metoprolol dose decreased to 12.5 daily  Hyperlipidemia/Dyslipidemia -Continue with home statin   Pressure Injury 01/07/19 Stage I -  Intact skin with non-blanchable redness of a localized area usually over a bony prominence. reddened area over boney prominance; Allevyn intact (Active)  01/07/19 1145  Location: Vertebral column  Location Orientation: Mid;Upper  Staging: Stage I -  Intact skin with non-blanchable redness of a localized area usually over a bony prominence.  Wound Description (Comments): reddened area over boney prominance; Allevyn intact  Present on Admission: No      Estimated body mass index is 20.86 kg/m as calculated from the following:   Height as of this encounter: 5\' 7"  (1.702 m).   Weight as of this encounter: 60.4 kg.  Discharge Instructions  Discharge Instructions    Call MD for:  difficulty breathing, headache or visual disturbances   Complete by:  As directed    Call MD for:  persistant dizziness or light-headedness   Complete by:  As directed    Call MD for:  persistant nausea and  vomiting   Complete by:  As directed    Call MD for:  severe uncontrolled pain   Complete by:  As directed    Diet - low sodium heart healthy   Complete by:  As directed    Increase activity slowly   Complete by:  As directed      Allergies as of 01/19/2019      Reactions   Asa Buff (mag [buffered Aspirin] Nausea Only   Zetia [ezetimibe] Other (See Comments)   Can not remember reaction      Medication List    STOP taking these medications   warfarin 2.5 MG tablet Commonly known as:  COUMADIN     TAKE these medications   amoxicillin-clavulanate 875-125 MG tablet Commonly known as:  Augmentin Take 1 tablet by mouth 2 (two) times daily for 5 days.   bisacodyl 10 MG suppository Commonly known as:  DULCOLAX Place 1 suppository (10 mg total) rectally daily as needed for moderate constipation.   metoprolol tartrate 25 MG tablet Commonly known as:  LOPRESSOR Take 0.5 tablets (12.5 mg total) by mouth daily. Start taking on:  Jan 20, 2019 What changed:  when to take this   pantoprazole 40 MG tablet Commonly known as:  Protonix Take 1 tablet (40 mg total) by mouth 2 (two) times daily.   psyllium 58.6 % packet Commonly known as:  METAMUCIL Take 1 packet by mouth daily.   rosuvastatin 10 MG tablet Commonly known as:  CRESTOR TAKE 1 TABLET DAILY   senna-docusate 8.6-50 MG tablet Commonly known as:  Senokot-S Take 1 tablet by mouth at bedtime as needed for  mild constipation.      Follow-up Information    Lendon Colonel, NP Follow up on 02/07/2019.   Specialties:  Nurse Practitioner, Radiology, Cardiology Why:  Please arrive to the office 15 minutes early for your 1:45pm post-hospital follow-up appointment.  Contact information: [address] [place] [address] [phone]        [name], MD Follow up.   Specialty:  Family Medicine Contact information: [place] [address] [phone]        Sanda Klein, MD .   Specialty:  Cardiology Contact information: [address] [place] [address] [place] [address] [phone]          Allergies  Allergen Reactions  . Asa Buff (Mag [Buffered Aspirin] Nausea Only  . Zetia [Ezetimibe] Other (See Comments)    Can not remember reaction    Consultations:  Cardiology   Procedures/Studies: X-ray Chest Pa And Lateral  Result Date: 01/17/2019 CLINICAL DATA:  SOB EXAM: CHEST - 2 VIEW COMPARISON:  01/16/19, CT 10/07/2015 FINDINGS: Sternal wires overlie normal cardiac silhouette. Scattered bilateral fine airspace disease similar prior. Low lung volumes. No pneumothorax. Lateral projection demonstrates chronic compression fracture at the thoracolumbar junction with kyphosis. Abdominal stent graft noted IMPRESSION: Bilateral patchy airspace disease representing multifocal pneumonia versus pulmonary edema. No significant change from 1 day prior. Electronically Signed   By: Suzy Bouchard M.D.   On: 01/17/2019 09:21   Ct Head Wo Contrast  Result Date: 01/05/2019 CLINICAL DATA:  Altered level of consciousness. EXAM: CT HEAD WITHOUT CONTRAST TECHNIQUE: Contiguous axial images were obtained from the base of the skull through the vertex without intravenous contrast. COMPARISON:  CT head 10/18/2009 FINDINGS: Brain: Moderate atrophy with mild progression. Chronic microvascular ischemic changes in the white matter. Small chronic infarct bilateral parietal lobe unchanged. No acute infarct, hemorrhage, mass. Vascular: Negative for hyperdense vessel. Atherosclerotic calcification in the cavernous carotid bilaterally. Skull: Negative Sinuses/Orbits: Negative Other: None IMPRESSION: Moderate atrophy and chronic ischemic changes. No acute abnormality. Electronically Signed   By: Franchot Gallo M.D.   On: 01/05/2019 13:13   Dg Chest Port 1 View  Result Date: 01/16/2019 CLINICAL DATA:  Low oxygen saturation.  Shortness of breath. EXAM: PORTABLE CHEST 1 VIEW  COMPARISON:  01/05/2019.  09/02/2017. FINDINGS: Previous median sternotomy. The patient has a background pattern mild chronic pulmonary scarring at the bases. There is diffuse pulmonary hazy opacity which has worsened further since the study of 11 days ago, consistent with pneumonia. There is some volume loss and consolidation in the left lower lobe. There may be some pleural fluid on left. Findings are presumed secondary to pneumonia, though there could be an element of fluid overload/edema. IMPRESSION: Continued worsening of diffuse pulmonary density. Left lower lobe density and probable left effusion. Findings could relate to a combination of pneumonia and heart failure. Electronically Signed   By: Nelson Chimes M.D.   On: 01/16/2019 15:49   Dg Chest Port 1 View  Result Date: 01/05/2019 CLINICAL DATA:  Shortness of breath and difficulty speaking EXAM: PORTABLE CHEST 1 VIEW COMPARISON:  09/02/2017 FINDINGS: Cardiac shadow is mildly prominent but accentuated by the portable technique. Postsurgical changes are seen. Patchy infiltrative changes are noted in both lungs. Small left effusion is noted as well. No acute bony abnormality is noted. IMPRESSION: Patchy infiltrates worst in the left base with small effusion. Electronically Signed   By: Inez Catalina M.D.   On: 01/05/2019 13:14   Dg Swallowing Func-speech  Pathology  Result Date: 01/08/2019 Objective Swallowing Evaluation: Type of Study: MBS-Modified Barium Swallow Study  Patient Details Name: GARHETT BERNHARD MRN: [id number] Date of Birth: Jun 04, 1923 Today's Date: 01/08/2019 Time: SLP Start Time (ACUTE ONLY): 0915 -SLP Stop Time (ACUTE ONLY): 0935 SLP Time Calculation (min) (ACUTE ONLY): 20 min Past Medical History: Past Medical History: Diagnosis Date . AAA (abdominal aortic aneurysm) ([place])  . Actinic keratoses  . Anxiety  . ASHD (arteriosclerotic heart disease) 1984 . Atrial fibrillation ([place])  . Cancer ([place]) 1999  PROSTATE . CVA (cerebral vascular  accident) ([place])  . Diverticulosis  . Dyslipidemia  . Dysrhythmia  . Personal history of colonic polyps  Past Surgical History: Past Surgical History: Procedure Laterality Date . ABDOMINAL AORTIC ENDOVASCULAR STENT GRAFT N/A 12/11/2015  Procedure: ABDOMINAL AORTIC ENDOVASCULAR STENT GRAFT;  Surgeon: Serafina Mitchell, MD;  Location: [place];  Service: Vascular;  Laterality: N/A; . CORONARY ARTERY BYPASS GRAFT  1984  [facility]- [facility] Pulling x4 . EYE SURGERY Bilateral   cataracts . ODONTOID FRACTURE SURGERY    Odontoid screw fixation of type 2 odontoid fracture, open reduction and internal fixation of the fracture--patient had fallen down the stairs at 85 years old . OTHER SURGICAL HISTORY  01/29/1998  Stage TIC adenocarcinoma of the prostate--Surgeon--David, Grapey M.D.--Interstital seed implantation with I-125 seeds--PSA of approximately 7 . PROSTATE SURGERY  approx 2003  [facility] -Grapey . TONSILLECTOMY   HPI: Patient is a 85 y.o. male with PMH: recent CVA with residual dysarthria, prostate cancer, atrial fibrilation on anticoagulation with Coumadin, dyslipidemia, CAD s/p CABG, who was brought to ER with generalized weakness, several days of black-colored stool. CXR revealed left lower lobe infiltrate with small pleural effusion; GI was consulted and he was started on PPI and clear liquids. Patient was observed to cough with clear liquids and was made NPO awaiting swallow evaluation.  Subjective: pleasant, sitting in chair in radiology suite Assessment / Plan / Recommendation CHL IP CLINICAL IMPRESSIONS 01/08/2019 Clinical Impression Patient presents with a mod-severe pharyngeal dysphagia which is likely chronic with contributing factors of h/o CVA, advanced age, h/o neck surgery. Patient exhibited delays in swallow initiation to level of vallecular sinus with all consistencies; residuals in pharynx post initial swallows at level of vallecular sinus, pyriform sinus, lateral channel and posterior pharyngeal wall with all  consistencies. Penetration occured during the swallow with thin liquids and with aspiration occuring after swallow, but patient was able to clear deep penetrate and aspirate with throat clear and reswallow. Amount of penetrate and/or aspirate was trace-minimal but was fairly consistent in occurance. Chin tuck posture and head turn postures were not beneficial in preventing or reducing penetration/aspiration and did not help in clearing pharyngeal residuals. Patient is fully aware and cogntively intake and able to perform strategy of throat clear and reswallow to clear laryngeal vestibule. He will remain at a high risk of aspiration, however improvement of dysphagia is not likely as it appears to be chronic in nature. SLP Visit Diagnosis Dysphagia, pharyngeal phase (R13.13) Attention and concentration deficit following -- Frontal lobe and executive function deficit following -- Impact on safety and function Moderate aspiration risk   CHL IP TREATMENT RECOMMENDATION 01/08/2019 Treatment Recommendations Therapy as outlined in treatment plan below   Prognosis 01/08/2019 Prognosis for Safe Diet Advancement Fair Barriers to Reach Goals -- Barriers/Prognosis Comment -- CHL IP DIET RECOMMENDATION 01/08/2019 SLP Diet Recommendations Thin liquid;Dysphagia 3 (Mech soft) solids Liquid Administration via Cup;No straw Medication Administration Crushed with puree Compensations Slow rate;Small sips/bites  Postural Changes Remain semi-upright after after feeds/meals (Comment);Seated upright at 90 degrees   CHL IP OTHER RECOMMENDATIONS 01/08/2019 Recommended Consults -- Oral Care Recommendations Oral care BID;Patient independent with oral care Other Recommendations --   CHL IP FOLLOW UP RECOMMENDATIONS 01/08/2019 Follow up Recommendations Home health SLP;Other (comment)   CHL IP FREQUENCY AND DURATION 01/08/2019 Speech Therapy Frequency (ACUTE ONLY) min 2x/week Treatment Duration 1 week      CHL IP ORAL PHASE 01/08/2019 Oral Phase WFL Oral -  Pudding Teaspoon -- Oral - Pudding Cup -- Oral - Honey Teaspoon -- Oral - Honey Cup -- Oral - Nectar Teaspoon -- Oral - Nectar Cup -- Oral - Nectar Straw -- Oral - Thin Teaspoon -- Oral - Thin Cup -- Oral - Thin Straw -- Oral - Puree -- Oral - Mech Soft -- Oral - Regular -- Oral - Multi-Consistency -- Oral - Pill -- Oral Phase - Comment --  CHL IP PHARYNGEAL PHASE 01/08/2019 Pharyngeal Phase Impaired Pharyngeal- Pudding Teaspoon -- Pharyngeal -- Pharyngeal- Pudding Cup -- Pharyngeal -- Pharyngeal- Honey Teaspoon -- Pharyngeal -- Pharyngeal- Honey Cup -- Pharyngeal -- Pharyngeal- Nectar Teaspoon -- Pharyngeal -- Pharyngeal- Nectar Cup Delayed swallow initiation-vallecula;Pharyngeal residue - valleculae;Pharyngeal residue - pyriform;Pharyngeal residue - posterior pharnyx;Lateral channel residue Pharyngeal -- Pharyngeal- Nectar Straw -- Pharyngeal -- Pharyngeal- Thin Teaspoon -- Pharyngeal -- Pharyngeal- Thin Cup Delayed swallow initiation-vallecula;Pharyngeal residue - valleculae;Pharyngeal residue - pyriform;Lateral channel residue;Penetration/Aspiration during swallow;Trace aspiration Pharyngeal Material enters airway, CONTACTS cords and then ejected out;Material enters airway, passes BELOW cords then ejected out Pharyngeal- Thin Straw -- Pharyngeal -- Pharyngeal- Puree Delayed swallow initiation-vallecula;Pharyngeal residue - pyriform;Pharyngeal residue - valleculae;Pharyngeal residue - posterior pharnyx Pharyngeal -- Pharyngeal- Mechanical Soft -- Pharyngeal -- Pharyngeal- Regular Delayed swallow initiation-vallecula;Pharyngeal residue - pyriform;Pharyngeal residue - valleculae;Pharyngeal residue - posterior pharnyx Pharyngeal -- Pharyngeal- Multi-consistency -- Pharyngeal -- Pharyngeal- Pill -- Pharyngeal -- Pharyngeal Comment --  CHL IP CERVICAL ESOPHAGEAL PHASE 01/08/2019 Cervical Esophageal Phase WFL Pudding Teaspoon -- Pudding Cup -- Honey Teaspoon -- Honey Cup -- Nectar Teaspoon -- Nectar Cup -- Nectar Straw  -- Thin Teaspoon -- Thin Cup -- Thin Straw -- Puree -- Mechanical Soft -- Regular -- Multi-consistency -- Pill -- Cervical Esophageal Comment -- Dannial Monarch 01/08/2019, 10:46 AM Sonia Baller, MA, CCC-SLP Speech Therapy [facility] Acute [facility]               (Echo, Carotid, EGD, Colonoscopy, ERCP)    Subjective: Patient is resting in bed on 100% on room air denies any chest pain shortness of breath  Discharge Exam: Vitals:   01/18/19 2117 01/19/19 0457  BP: 115/62 (!) 110/53  Pulse: 69 77  Resp: 20 18  Temp: 98.2 F (36.8 C) 97.6 F (36.4 C)  SpO2: 98% 94%   Vitals:   01/18/19 1341 01/18/19 1541 01/18/19 2117 01/19/19 0457  BP: (!) 87/50 (!) 90/56 115/62 (!) 110/53  Pulse: 68  69 77  Resp: 16  20 18   Temp: 98 F (36.7 C)  98.2 F (36.8 C) 97.6 F (36.4 C)  TempSrc: Oral  Oral Oral  SpO2: 96% 94% 98% 94%  Weight:    60.4 kg  Height:        General: Pt is alert, awake, not in acute distress Cardiovascular: RRR, S1/S2 +, no rubs, no gallops Respiratory: CTA bilaterally, no wheezing, no rhonchi Abdominal: Soft, NT, ND, bowel sounds + Extremities: no edema, no cyanosis    The results of significant diagnostics from this hospitalization (including imaging,  microbiology, ancillary and laboratory) are listed below for reference.     Microbiology: Recent Results (from the past 240 hour(s))  SARS Coronavirus 2 (CEPHEID - Performed in [facility] hospital lab), Hosp Order     Status: None   Collection Time: 01/16/19  3:22 PM  Result Value Ref Range Status   SARS Coronavirus 2 NEGATIVE NEGATIVE Final    Comment: (NOTE) If result is NEGATIVE SARS-CoV-2 target nucleic acids are NOT DETECTED. The SARS-CoV-2 RNA is generally detectable in upper and lower  respiratory specimens during the acute phase of infection. The lowest  concentration of SARS-CoV-2 viral copies this assay can detect is 250  copies / mL. A negative result does not preclude SARS-CoV-2 infection  and  should not be used as the sole basis for treatment or other  patient management decisions.  A negative result may occur with  improper specimen collection / handling, submission of specimen other  than nasopharyngeal swab, presence of viral mutation(s) within the  areas targeted by this assay, and inadequate number of viral copies  (<250 copies / mL). A negative result must be combined with clinical  observations, patient history, and epidemiological information. If result is POSITIVE SARS-CoV-2 target nucleic acids are DETECTED. The SARS-CoV-2 RNA is generally detectable in upper and lower  respiratory specimens dur ing the acute phase of infection.  Positive  results are indicative of active infection with SARS-CoV-2.  Clinical  correlation with patient history and other diagnostic information is  necessary to determine patient infection status.  Positive results do  not rule out bacterial infection or co-infection with other viruses. If result is PRESUMPTIVE POSTIVE SARS-CoV-2 nucleic acids MAY BE PRESENT.   A presumptive positive result was obtained on the submitted specimen  and confirmed on repeat testing.  While 2019 novel coronavirus  (SARS-CoV-2) nucleic acids may be present in the submitted sample  additional confirmatory testing may be necessary for epidemiological  and / or clinical management purposes  to differentiate between  SARS-CoV-2 and other Sarbecovirus currently known to infect humans.  If clinically indicated additional testing with an alternate test  methodology [phone]) is advised. The SARS-CoV-2 RNA is generally  detectable in upper and lower respiratory sp ecimens during the acute  phase of infection. The expected result is Negative. Fact Sheet for Patients:  StrictlyIdeas.no Fact Sheet for Healthcare Providers: BankingDealers.co.za This test is not yet approved or cleared by the [place] FDA and has been  authorized for detection and/or diagnosis of SARS-CoV-2 by FDA under an Emergency Use Authorization (EUA).  This EUA will remain in effect (meaning this test can be used) for the duration of the COVID-19 declaration under Section 564(b)(1) of the Act, 21 U.S.C. section 360bbb-3(b)(1), unless the authorization is terminated or revoked sooner. Performed at [facility], [place] [address], [place], [place] [address]   Blood culture (routine x 2)     Status: None (Preliminary result)   Collection Time: 01/16/19  4:06 PM  Result Value Ref Range Status   Specimen Description   Final    BLOOD SITE NOT SPECIFIED Performed at [facility] [address], [place], [place] [address]    Special Requests   Final    BOTTLES DRAWN AEROBIC AND ANAEROBIC Blood Culture adequate volume Performed at [facility] [address], [place], [place] [address]    Culture   Final    NO GROWTH 2 DAYS Performed at [facility]  [address] [place], [place] [address]    Report Status PENDING  Incomplete  Blood culture (routine x 2)     Status: None (Preliminary result)   Collection Time: 01/16/19  4:16 PM  Result Value Ref Range Status   Specimen Description   Final    BLOOD SITE NOT SPECIFIED Performed at [facility] [address], [place], [place] [address]    Special Requests   Final    BOTTLES DRAWN AEROBIC AND ANAEROBIC Blood Culture adequate volume Performed at [facility] [address], [place], [place] [address]    Culture   Final    NO GROWTH 2 DAYS Performed at [facility] [address] [place], [place] [address]    Report Status PENDING  Incomplete  MRSA PCR Screening     Status: None   Collection Time: 01/17/19 12:37 PM  Result Value Ref Range Status   MRSA by PCR NEGATIVE NEGATIVE Final    Comment:        The GeneXpert MRSA Assay (FDA approved for NASAL  specimens only), is one component of a comprehensive MRSA colonization surveillance program. It is not intended to diagnose MRSA infection nor to guide or monitor treatment for MRSA infections. Performed at [facility], [place] [address], [place], [place] [address]      Labs: BNP (last 3 results) Recent Labs    01/05/19 1045 01/16/19 1550  BNP 563.8* 573.2*   Basic Metabolic Panel: Recent Labs  Lab 01/16/19 1550 01/16/19 2041 01/17/19 0522 01/18/19 0552  NA 141  --  140 141  K 4.3  --  4.3 3.9  CL 109  --  108 103  CO2 25  --  26 29  GLUCOSE 109*  --  95 86  BUN 21  --  19 20  CREATININE 1.12  --  1.10 1.13  CALCIUM 8.5*  --  8.1* 8.6*  MG  --  2.1 2.3 2.0  PHOS  --  3.6 4.1  --    Liver Function Tests: Recent Labs  Lab 01/17/19 0522  AST 39  ALT 20  ALKPHOS 126  BILITOT 0.7  PROT 6.6  ALBUMIN 2.3*   No results for input(s): LIPASE, AMYLASE in the last 168 hours. No results for input(s): AMMONIA in the last 168 hours. CBC: Recent Labs  Lab 01/16/19 1550 01/17/19 0522 01/18/19 0552  WBC 7.2 6.7 7.3  NEUTROABS 5.8 5.4  --   HGB 8.3* 7.7* 9.8*  HCT 28.6* 27.3* 33.0*  MCV 96.6 95.8 94.3  PLT 374 348 380   Cardiac Enzymes: Recent Labs  Lab 01/16/19 1550  TROPONINI 0.05*   BNP: Invalid input(s): POCBNP CBG: Recent Labs  Lab 01/17/19 0714 01/17/19 0752 01/18/19 0715  GLUCAP 65* 72 72   D-Dimer No results for input(s): DDIMER in the last 72 hours. Hgb A1c Recent Labs    01/16/19 2041  HGBA1C 5.3   Lipid Profile No results for input(s): CHOL, HDL, LDLCALC, TRIG, CHOLHDL, LDLDIRECT in the last 72 hours. Thyroid function studies Recent Labs    01/16/19 2041  TSH 4.125   Anemia work up Recent Labs    01/17/19 0522  VITAMINB12 555  FOLATE 20.5  FERRITIN 89  TIBC 240*  IRON 20*  RETICCTPCT 1.9   Urinalysis    Component Value Date/Time   COLORURINE YELLOW 01/05/2019 [place] 01/05/2019  1045   LABSPEC >1.030 (H) 01/05/2019 1045   PHURINE 5.5  01/05/2019 [place] 01/05/2019 [place] 01/05/2019 [place] 01/05/2019 [place] 01/05/2019 [place] 01/05/2019 1045   UROBILINOGEN 0.2 10/27/2009 1249   NITRITE NEGATIVE 01/05/2019 1045   LEUKOCYTESUR NEGATIVE 01/05/2019 1045   Sepsis Labs Invalid input(s): PROCALCITONIN,  WBC,  LACTICIDVEN Microbiology Recent Results (from the past 240 hour(s))  SARS Coronavirus 2 (CEPHEID - Performed in [place] hospital lab), Hosp Order     Status: None   Collection Time: 01/16/19  3:22 PM  Result Value Ref Range Status   SARS Coronavirus 2 NEGATIVE NEGATIVE Final    Comment: (NOTE) If result is NEGATIVE SARS-CoV-2 target nucleic acids are NOT DETECTED. The SARS-CoV-2 RNA is generally detectable in upper and lower  respiratory specimens during the acute phase of infection. The lowest  concentration of SARS-CoV-2 viral copies this assay can detect is 250  copies / mL. A negative result does not preclude SARS-CoV-2 infection  and should not be used as the sole basis for treatment or other  patient management decisions.  A negative result may occur with  improper specimen collection / handling, submission of specimen other  than nasopharyngeal swab, presence of viral mutation(s) within the  areas targeted by this assay, and inadequate number of viral copies  (<250 copies / mL). A negative result must be combined with clinical  observations, patient history, and epidemiological information. If result is POSITIVE SARS-CoV-2 target nucleic acids are DETECTED. The SARS-CoV-2 RNA is generally detectable in upper and lower  respiratory specimens dur ing the acute phase of infection.  Positive  results are indicative of active infection with SARS-CoV-2.  Clinical  correlation with patient history and other diagnostic information is  necessary to determine  patient infection status.  Positive results do  not rule out bacterial infection or co-infection with other viruses. If result is PRESUMPTIVE POSTIVE SARS-CoV-2 nucleic acids MAY BE PRESENT.   A presumptive positive result was obtained on the submitted specimen  and confirmed on repeat testing.  While 2019 novel coronavirus  (SARS-CoV-2) nucleic acids may be present in the submitted sample  additional confirmatory testing may be necessary for epidemiological  and / or clinical management purposes  to differentiate between  SARS-CoV-2 and other Sarbecovirus currently known to infect humans.  If clinically indicated additional testing with an alternate test  methodology [phone]) is advised. The SARS-CoV-2 RNA is generally  detectable in upper and lower respiratory sp ecimens during the acute  phase of infection. The expected result is Negative. Fact Sheet for Patients:  StrictlyIdeas.no Fact Sheet for Healthcare Providers: BankingDealers.co.za This test is not yet approved or cleared by the [place] FDA and has been authorized for detection and/or diagnosis of SARS-CoV-2 by FDA under an Emergency Use Authorization (EUA).  This EUA will remain in effect (meaning this test can be used) for the duration of the COVID-19 declaration under Section 564(b)(1) of the Act, 21 U.S.C. section 360bbb-3(b)(1), unless the authorization is terminated or revoked sooner. Performed at [facility], [place] [address] [place], [place] [address]   Blood culture (routine x 2)     Status: None (Preliminary result)   Collection Time: 01/16/19  4:06 PM  Result Value Ref Range Status   Specimen Description   Final    BLOOD SITE NOT SPECIFIED Performed at [facility] [address] [place], [place] [address]    Special Requests   Final  BOTTLES DRAWN AEROBIC AND ANAEROBIC Blood Culture adequate volume Performed at  [facility] [address] [place], [place] [address]    Culture   Final    NO GROWTH 2 DAYS Performed at [place] [address] [place], [place] [address]    Report Status PENDING  Incomplete  Blood culture (routine x 2)     Status: None (Preliminary result)   Collection Time: 01/16/19  4:16 PM  Result Value Ref Range Status   Specimen Description   Final    BLOOD SITE NOT SPECIFIED Performed at [facility] [address], [place], [place] [address]    Special Requests   Final    BOTTLES DRAWN AEROBIC AND ANAEROBIC Blood Culture adequate volume Performed at [facility] [address], [place], [place] [address]    Culture   Final    NO GROWTH 2 DAYS Performed at [facility] [address] [place], [place] [address]    Report Status PENDING  Incomplete  MRSA PCR Screening     Status: None   Collection Time: 01/17/19 12:37 PM  Result Value Ref Range Status   MRSA by PCR NEGATIVE NEGATIVE Final    Comment:        The GeneXpert MRSA Assay (FDA approved for NASAL specimens only), is one component of a comprehensive MRSA colonization surveillance program. It is not intended to diagnose MRSA infection nor to guide or monitor treatment for MRSA infections. Performed at [facility], [place] [address], [place], [place] [address]      Time coordinating discharge: 33 minutes  SIGNED:   [name], MD  [facility] 01/19/2019, 10:32 AM Pager   If 7PM-7AM, please contact night-coverage [email or web address] Password [email or web address]

## 2019-01-19 NOTE — Progress Notes (Signed)
PT Cancellation Note  Patient Details Name: TRAMELL PIECHOTA MRN: [id number] DOB: Aug 19, 1923   Cancelled Treatment:    Reason Eval/Treat Not Completed: Other (comment) Pt politely declines mobility today stating he is going home.   Samya Siciliano,KATHrine E 01/19/2019, 11:44 AM Carmelia Bake, PT, DPT Acute Rehabilitation Services Office: [phone] Pager: [phone]

## 2019-01-19 NOTE — Progress Notes (Signed)
Patient and family given discharge instructions and all questions answered.   

## 2019-01-19 NOTE — Progress Notes (Signed)
Occupational Therapy Treatment Patient Details Name: Gary Valencia MRN: [id number] DOB: 1923/01/12 Today's Date: 01/19/2019    History of present illness 85 y.o. male with medical history significant of AAA, history of actinic keratosis, history of anxiety, history of atherosclerotic heart disease with CAD status post CABG in 1984, history of permanent atrial fibrillation on chronic anticoagulation with warfarin, history of prostate cancer, hyperlipidemia, anxiety, history of anemia, and other comorbidities who presents with low oxygen saturation and shortness of muscle exertion.  Of note patient was recently hospitalized 11 days ago for symptomatic anemia and pneumonia and was discharged home on the 18th.    OT comments  Pt presenting toward stated goals, focused session on functional mobility and endurance with positioning for optimum respiratory health. Pt completing bed mobility at min A, requiring A to rise trunk at EOB. Attempted to don socks, pt stating his wife normally does this for him at baseline. Pt attempting and ultimately requiring max-total A. Toilet t/f simulated with recliner, pt requiring min A and repetitive cues for safety. Pt presenting with cognitive like deficits today. Very repetitive and fixated on discharge and son. Oriented to place, but not situation stating "I have never had issues with my breathing". O2 reading unable to obtain on pt with dynamap despite multiple attempts. Mild DOE noted throughout activity, pt being weaned off O2 this date. D/c plans remain appropriate. Will continue to follow per POC.   Follow Up Recommendations  Home health OT;Supervision/Assistance - 24 hour    Equipment Recommendations  3 in 1 bedside commode    Recommendations for Other Services      Precautions / Restrictions Precautions Precautions: Fall Precaution Comments: monitor O2 Restrictions Weight Bearing Restrictions: No       Mobility Bed Mobility Overal bed mobility:  Needs Assistance Bed Mobility: Supine to Sit     Supine to sit: Min assist     General bed mobility comments: assist to rise with trunk  Transfers Overall transfer level: Needs assistance Equipment used: Rolling walker (2 wheeled) Transfers: Sit to/from [facility] Sit to Stand: Min assist         General transfer comment: min A, increased time    Balance Overall balance assessment: Needs assistance Sitting-balance support: Feet supported Sitting balance-Leahy Scale: Fair     Standing balance support: Bilateral upper extremity supported;During functional activity Standing balance-Leahy Scale: Poor Standing balance comment: reliant on external support                           ADL either performed or assessed with clinical judgement   ADL Overall ADL's : Needs assistance/impaired                     Lower Body Dressing: Maximal assistance;Total assistance;Sit to/from stand;Sitting/lateral leans Lower Body Dressing Details (indicate cue type and reason): pt recieves total A to don socks from wife at baseline Toilet Transfer: Minimal assistance;Ambulation;Regular Toilet;Grab bars;RW [employer] forces technical [employer] Details (indicate cue type and reason): simulated with recliner         Functional mobility during ADLs: Minimal assistance;Rolling walker General ADL Comments: focused session on room mobility and positioning to promote healthy respiratory function     Vision Baseline Vision/History: Wears glasses Wears Glasses: At all times Patient Visual Report: No change from baseline     Perception     Praxis      Cognition Arousal/Alertness: Awake/alert Behavior During Therapy: [facility] for tasks assessed/performed  Overall Cognitive Status: No family/caregiver present to determine baseline cognitive functioning                                 General Comments: pt repeating self during session, and stating "my son is supposed to  come pick him up" when meaning himself. Needing repetition to complete tasks        Exercises     Shoulder Instructions       General Comments      Pertinent Vitals/ Pain       Pain Assessment: No/denies pain  Home Living                                          Prior Functioning/Environment              Frequency  Min 2X/week        Progress Toward Goals  OT Goals(current goals can now be found in the care plan section)  Progress towards OT goals: Progressing toward goals  Acute Rehab OT Goals Patient Stated Goal: to gohome soon and remain active OT Goal Formulation: With patient Time For Goal Achievement: 01/31/19 Potential to Achieve Goals: Good  Plan Discharge plan remains appropriate    Co-evaluation                 AM-PAC OT "6 Clicks" Daily Activity     Outcome Measure   Help from another person eating meals?: None Help from another person taking care of personal grooming?: A Little Help from another person toileting, which includes using toliet, bedpan, or urinal?: A Little Help from another person bathing (including washing, rinsing, drying)?: A Lot Help from another person to put on and taking off regular upper body clothing?: A Little Help from another person to put on and taking off regular lower body clothing?: A Lot 6 Click Score: 17    End of Session Equipment Utilized During Treatment: Gait belt;Rolling walker  OT Visit Diagnosis: Unsteadiness on feet (R26.81);Muscle weakness (generalized) (M62.81)   Activity Tolerance Patient tolerated treatment well   Patient Left in chair;with call bell/phone within reach;with chair alarm set   Nurse Communication          Time: [phone] OT Time Calculation (min): 25 min  Charges: OT General Charges $OT Visit: 1 Visit OT Treatments $Self Care/Home Management : 23-37 mins  Zenovia Jarred, MSOT, OTR/L [place] Office:  [place] 01/19/2019, 10:14 AM

## 2019-01-19 NOTE — Care Management Important Message (Signed)
Important Message  Patient Details IM letter given to Dessa Phi RN to present to the Patient Name: Gary Valencia MRN: [id number] Date of Birth: 07-Apr-1923   Medicare Important Message Given:  Yes    Kerin Salen 01/19/2019, 11:50 AM

## 2019-01-19 NOTE — TOC Transition Note (Signed)
Transition of Care [facility]) - CM/SW Discharge Note   Patient Details  Name: Gary Valencia MRN: [id number] Date of Birth: [birth year]  [facility] of [facility]) CM/SW Contact:  Dessa Phi, RN Phone Number: 01/19/2019, 11:12 AM   Clinical Narrative:  Spoke to spouse about d/c plans-Dc home w/[facility]/PT/OT. Outpatient Palliative Care Services through [facility](Care connections). Spouse says she has a 3n1 already. Son will transport home on own. No further CM needs.       Barriers to Discharge: No Barriers Identified   Patient Goals and CMS Choice Patient states their goals for this hospitalization and ongoing recovery are:: go home      Discharge Placement                       Discharge Plan and Services   Discharge Planning Services: CM Consult                      HH Arranged: PT, RN, OT [facility] Agency: [facility] Date [place]: 01/17/19 Time [place]: [place] Representative spoke with at [place]: Cassie  Social Determinants of Health ([place]) Interventions     Readmission Risk Interventions No flowsheet data found.

## 2019-01-21 LAB — CULTURE, BLOOD (ROUTINE X 2)
Culture: NO GROWTH
Culture: NO GROWTH
Special Requests: ADEQUATE
Special Requests: ADEQUATE

## 2019-01-24 ENCOUNTER — Other Ambulatory Visit: Payer: Self-pay

## 2019-01-24 ENCOUNTER — Encounter (HOSPITAL_COMMUNITY): Payer: Self-pay

## 2019-01-24 ENCOUNTER — Inpatient Hospital Stay (HOSPITAL_COMMUNITY)
Admission: EM | Admit: 2019-01-24 | Discharge: 2019-02-21 | DRG: 871 | Disposition: E | Payer: Medicare Other | Attending: Internal Medicine | Admitting: Internal Medicine

## 2019-01-24 ENCOUNTER — Emergency Department (HOSPITAL_COMMUNITY): Payer: Medicare Other

## 2019-01-24 DIAGNOSIS — J9601 Acute respiratory failure with hypoxia: Secondary | ICD-10-CM | POA: Diagnosis not present

## 2019-01-24 DIAGNOSIS — I480 Paroxysmal atrial fibrillation: Secondary | ICD-10-CM | POA: Diagnosis present

## 2019-01-24 DIAGNOSIS — Z8679 Personal history of other diseases of the circulatory system: Secondary | ICD-10-CM

## 2019-01-24 DIAGNOSIS — Z515 Encounter for palliative care: Secondary | ICD-10-CM | POA: Diagnosis present

## 2019-01-24 DIAGNOSIS — Z66 Do not resuscitate: Secondary | ICD-10-CM | POA: Diagnosis present

## 2019-01-24 DIAGNOSIS — J189 Pneumonia, unspecified organism: Secondary | ICD-10-CM

## 2019-01-24 DIAGNOSIS — D689 Coagulation defect, unspecified: Secondary | ICD-10-CM | POA: Diagnosis present

## 2019-01-24 DIAGNOSIS — D649 Anemia, unspecified: Secondary | ICD-10-CM | POA: Diagnosis present

## 2019-01-24 DIAGNOSIS — Z8546 Personal history of malignant neoplasm of prostate: Secondary | ICD-10-CM | POA: Diagnosis not present

## 2019-01-24 DIAGNOSIS — I69322 Dysarthria following cerebral infarction: Secondary | ICD-10-CM

## 2019-01-24 DIAGNOSIS — J690 Pneumonitis due to inhalation of food and vomit: Secondary | ICD-10-CM | POA: Diagnosis present

## 2019-01-24 DIAGNOSIS — Y95 Nosocomial condition: Secondary | ICD-10-CM | POA: Diagnosis present

## 2019-01-24 DIAGNOSIS — Z951 Presence of aortocoronary bypass graft: Secondary | ICD-10-CM

## 2019-01-24 DIAGNOSIS — A419 Sepsis, unspecified organism: Secondary | ICD-10-CM | POA: Diagnosis present

## 2019-01-24 DIAGNOSIS — Z79899 Other long term (current) drug therapy: Secondary | ICD-10-CM | POA: Diagnosis not present

## 2019-01-24 DIAGNOSIS — I110 Hypertensive heart disease with heart failure: Secondary | ICD-10-CM | POA: Diagnosis present

## 2019-01-24 DIAGNOSIS — Z8249 Family history of ischemic heart disease and other diseases of the circulatory system: Secondary | ICD-10-CM

## 2019-01-24 DIAGNOSIS — N179 Acute kidney failure, unspecified: Secondary | ICD-10-CM | POA: Diagnosis present

## 2019-01-24 DIAGNOSIS — Z8719 Personal history of other diseases of the digestive system: Secondary | ICD-10-CM

## 2019-01-24 DIAGNOSIS — R1313 Dysphagia, pharyngeal phase: Secondary | ICD-10-CM | POA: Diagnosis present

## 2019-01-24 DIAGNOSIS — Z7901 Long term (current) use of anticoagulants: Secondary | ICD-10-CM

## 2019-01-24 DIAGNOSIS — Z7189 Other specified counseling: Secondary | ICD-10-CM | POA: Diagnosis not present

## 2019-01-24 DIAGNOSIS — I5032 Chronic diastolic (congestive) heart failure: Secondary | ICD-10-CM | POA: Diagnosis present

## 2019-01-24 DIAGNOSIS — I2510 Atherosclerotic heart disease of native coronary artery without angina pectoris: Secondary | ICD-10-CM | POA: Diagnosis present

## 2019-01-24 DIAGNOSIS — E785 Hyperlipidemia, unspecified: Secondary | ICD-10-CM | POA: Diagnosis present

## 2019-01-24 DIAGNOSIS — J9611 Chronic respiratory failure with hypoxia: Secondary | ICD-10-CM | POA: Diagnosis not present

## 2019-01-24 DIAGNOSIS — E870 Hyperosmolality and hypernatremia: Secondary | ICD-10-CM | POA: Diagnosis not present

## 2019-01-24 DIAGNOSIS — R6510 Systemic inflammatory response syndrome (SIRS) of non-infectious origin without acute organ dysfunction: Secondary | ICD-10-CM

## 2019-01-24 DIAGNOSIS — Z20828 Contact with and (suspected) exposure to other viral communicable diseases: Secondary | ICD-10-CM | POA: Diagnosis present

## 2019-01-24 HISTORY — DX: Dysphagia, unspecified: R13.10

## 2019-01-24 LAB — LACTIC ACID, PLASMA
Lactic Acid, Venous: 1.6 mmol/L (ref 0.5–1.9)
Lactic Acid, Venous: 1.3 mmol/L (ref 0.5–1.9)

## 2019-01-24 LAB — CBC WITH DIFFERENTIAL/PLATELET
Abs Immature Granulocytes: 0.1 10*3/uL — ABNORMAL HIGH (ref 0.00–0.07)
Basophils Absolute: 0.1 10*3/uL (ref 0.0–0.1)
Basophils Relative: 0 %
Eosinophils Absolute: 0.1 10*3/uL (ref 0.0–0.5)
Eosinophils Relative: 0 %
HCT: 31.4 % — ABNORMAL LOW (ref 39.0–52.0)
Hemoglobin: 9 g/dL — ABNORMAL LOW (ref 13.0–17.0)
Immature Granulocytes: 1 %
Lymphocytes Relative: 3 %
Lymphs Abs: 0.4 10*3/uL — ABNORMAL LOW (ref 0.7–4.0)
MCH: 27.7 pg (ref 26.0–34.0)
MCHC: 28.7 g/dL — ABNORMAL LOW (ref 30.0–36.0)
MCV: 96.6 fL (ref 80.0–100.0)
Monocytes Absolute: 0.4 10*3/uL (ref 0.1–1.0)
Monocytes Relative: 3 %
Neutro Abs: 12.5 10*3/uL — ABNORMAL HIGH (ref 1.7–7.7)
Neutrophils Relative %: 93 %
Platelets: 392 10*3/uL (ref 150–400)
RBC: 3.25 MIL/uL — ABNORMAL LOW (ref 4.22–5.81)
RDW: 19.2 % — ABNORMAL HIGH (ref 11.5–15.5)
WBC: 13.6 10*3/uL — ABNORMAL HIGH (ref 4.0–10.5)
nRBC: 0 % (ref 0.0–0.2)

## 2019-01-24 LAB — COMPREHENSIVE METABOLIC PANEL
ALT: 20 U/L (ref 0–44)
AST: 39 U/L (ref 15–41)
Albumin: 2.1 g/dL — ABNORMAL LOW (ref 3.5–5.0)
Alkaline Phosphatase: 122 U/L (ref 38–126)
Anion gap: 10 (ref 5–15)
BUN: 31 mg/dL — ABNORMAL HIGH (ref 8–23)
CO2: 24 mmol/L (ref 22–32)
Calcium: 8.1 mg/dL — ABNORMAL LOW (ref 8.9–10.3)
Chloride: 106 mmol/L (ref 98–111)
Creatinine, Ser: 1.54 mg/dL — ABNORMAL HIGH (ref 0.61–1.24)
GFR calc Af Amer: 44 mL/min — ABNORMAL LOW (ref >60)
GFR calc non Af Amer: 38 mL/min — ABNORMAL LOW (ref >60)
Glucose, Bld: 77 mg/dL (ref 70–99)
Potassium: 4.1 mmol/L (ref 3.5–5.1)
Sodium: 140 mmol/L (ref 135–145)
Total Bilirubin: 0.8 mg/dL (ref 0.3–1.2)
Total Protein: 6.6 g/dL (ref 6.5–8.1)

## 2019-01-24 LAB — SARS CORONAVIRUS 2 BY RT PCR (HOSPITAL ORDER, PERFORMED IN ~~LOC~~ HOSPITAL LAB): SARS Coronavirus 2: NEGATIVE

## 2019-01-24 LAB — GLUCOSE, CAPILLARY
Glucose-Capillary: 106 mg/dL — ABNORMAL HIGH (ref 70–99)
Glucose-Capillary: 107 mg/dL — ABNORMAL HIGH (ref 70–99)
Glucose-Capillary: 69 mg/dL — ABNORMAL LOW (ref 70–99)
Glucose-Capillary: 74 mg/dL (ref 70–99)
Glucose-Capillary: 87 mg/dL (ref 70–99)
Glucose-Capillary: 89 mg/dL (ref 70–99)

## 2019-01-24 LAB — PROCALCITONIN: Procalcitonin: 1.59 ng/mL

## 2019-01-24 LAB — PROTIME-INR
INR: 2.9 — ABNORMAL HIGH (ref 0.8–1.2)
Prothrombin Time: 29.8 seconds — ABNORMAL HIGH (ref 11.4–15.2)

## 2019-01-24 MED ORDER — ONDANSETRON HCL 4 MG/2ML IJ SOLN: 4.0000 mg | Freq: Four times a day (QID) | INTRAMUSCULAR | Status: DC | PRN

## 2019-01-24 MED ORDER — SODIUM CHLORIDE 0.9 % IV BOLUS (SEPSIS)
1000.0000 mL | Freq: Once | INTRAVENOUS | Status: AC
  Administered 2019-01-24: 1000 mL via INTRAVENOUS

## 2019-01-24 MED ORDER — SODIUM CHLORIDE 0.9 % IV SOLN
INTRAVENOUS | Status: DC
  Administered 2019-01-24 (×2): via INTRAVENOUS

## 2019-01-24 MED ORDER — CHLORHEXIDINE GLUCONATE 0.12 % MT SOLN
15.0000 mL | Freq: Two times a day (BID) | OROMUCOSAL | Status: DC
  Administered 2019-01-24 (×2): 15 mL via OROMUCOSAL
  Filled 2019-01-24 (×2): qty 15

## 2019-01-24 MED ORDER — ORAL CARE MOUTH RINSE
15.0000 mL | Freq: Two times a day (BID) | OROMUCOSAL | Status: DC
  Administered 2019-01-24 (×2): 15 mL via OROMUCOSAL

## 2019-01-24 MED ORDER — CALCIUM GLUCONATE-NACL 1-0.675 GM/50ML-% IV SOLN
1.0000 g | Freq: Once | INTRAVENOUS | Status: AC
  Administered 2019-01-24: 1000 mg via INTRAVENOUS
  Filled 2019-01-24: qty 50

## 2019-01-24 MED ORDER — SODIUM CHLORIDE 0.9 % IV SOLN
2.0000 g | Freq: Once | INTRAVENOUS | Status: AC
  Administered 2019-01-24: 2 g via INTRAVENOUS
  Filled 2019-01-24: qty 2

## 2019-01-24 MED ORDER — CHLORHEXIDINE GLUCONATE CLOTH 2 % EX PADS
6.0000 | MEDICATED_PAD | Freq: Every day | CUTANEOUS | Status: DC
  Administered 2019-01-25 – 2019-01-26 (×2): 6 via TOPICAL

## 2019-01-24 MED ORDER — DEXTROSE-NACL 5-0.9 % IV SOLN
INTRAVENOUS | Status: AC
  Administered 2019-01-24: 16:00:00 via INTRAVENOUS

## 2019-01-24 MED ORDER — DEXTROSE-NACL 5-0.9 % IV SOLN
INTRAVENOUS | Status: DC
  Administered 2019-01-25: 11:00:00 via INTRAVENOUS

## 2019-01-24 MED ORDER — DEXTROSE-NACL 5-0.9 % IV SOLN
INTRAVENOUS | Status: DC
  Administered 2019-01-24: 16:00:00 via INTRAVENOUS

## 2019-01-24 MED ORDER — SODIUM CHLORIDE 0.9 % IV BOLUS
250.0000 mL | Freq: Once | INTRAVENOUS | Status: AC
  Administered 2019-01-24: 250 mL via INTRAVENOUS

## 2019-01-24 MED ORDER — NOREPINEPHRINE BITARTRATE 1 MG/ML IV SOLN
2.0000 ug/min | INTRAVENOUS | Status: DC
  Filled 2019-01-24: qty 4

## 2019-01-24 MED ORDER — SODIUM CHLORIDE 0.9 % IV SOLN: 250.0000 mL | INTRAVENOUS | Status: DC

## 2019-01-24 MED ORDER — SODIUM CHLORIDE 0.9 % IV BOLUS
1000.0000 mL | Freq: Once | INTRAVENOUS | Status: AC
  Administered 2019-01-24: 1000 mL via INTRAVENOUS

## 2019-01-24 MED ORDER — DEXTROSE 50 % IV SOLN
INTRAVENOUS | Status: AC
  Administered 2019-01-24: 25 mL
  Filled 2019-01-24: qty 50

## 2019-01-24 MED ORDER — VANCOMYCIN HCL 10 G IV SOLR
1250.0000 mg | Freq: Once | INTRAVENOUS | Status: AC
  Administered 2019-01-24: 1250 mg via INTRAVENOUS
  Filled 2019-01-24: qty 1250

## 2019-01-24 MED ORDER — HEPARIN SODIUM (PORCINE) 5000 UNIT/ML IJ SOLN
5000.0000 [IU] | Freq: Three times a day (TID) | INTRAMUSCULAR | Status: DC
  Administered 2019-01-24 – 2019-01-25 (×3): 5000 [IU] via SUBCUTANEOUS
  Filled 2019-01-24 (×3): qty 1

## 2019-01-24 MED ORDER — SODIUM CHLORIDE 0.9 % IV SOLN
2.0000 g | INTRAVENOUS | Status: DC
  Administered 2019-01-25: 2 g via INTRAVENOUS
  Filled 2019-01-24: qty 2

## 2019-01-24 MED ORDER — ACETAMINOPHEN 325 MG PO TABS: 650.0000 mg | ORAL_TABLET | ORAL | Status: DC | PRN

## 2019-01-24 NOTE — ED Notes (Signed)
Report given to Christian, RN.

## 2019-01-24 NOTE — Progress Notes (Signed)
Called by RN for soft BP which responded to 540ml fluid bolus. Additional call regarding hypoglycemia with CBG of 60, treated per protocol.    Plan: Change IVF to D5NS at 36ml/hr for 6 hours then decrease to 40 ml/hr Follow up CBG's Monitor for evidence of volume overload   Noe Gens, NP-C [facility] Pgr: [phone] or if no answer [phone] 02/13/2019, 4:02 PM

## 2019-01-24 NOTE — Progress Notes (Signed)
SLP Cancellation Note  Patient Details Name: Gary Valencia MRN: [id number] DOB: [birth year]   Cancelled treatment:       Reason Eval/Treat Not Completed: Other (comment)(received order for swallow evaluation; will follow up next date) due to pt decline and admit with respiratory deficits. Thanks for the order.    Gary Valencia 01/31/2019, 6:38 PM  Luanna Salk, [facility] [place] SLP Acute Rehab Services Pager [phone] Office [phone]

## 2019-01-24 NOTE — ED Provider Notes (Signed)
[facility] DEPT Provider Note: Gary Spurling, MD, FACEP  CSN: [id number] MRN: [id number] ARRIVAL: 02/04/2019 at [place]: [id number]   CHIEF COMPLAINT  Shortness of Breath  Level 5 caveat: Confusion HISTORY OF PRESENT ILLNESS  01/26/2019 2:49 AM Gary Valencia is a 85 y.o. male who was admitted on Jan 16, 2023 for respiratory failure with hypoxia in the setting of healthcare associated pneumonia and CHF exacerbation.  He was discharged on May 29 on Augmentin.  He returns with increased shortness of breath with oxygen saturations in the 70s on room air.  He was placed on a nonrebreather by EMS with improvement to 100%.  He is somewhat confused but able to deny any chest pain.  On arrival he was noted to be hypotensive with tachypnea and the sepsis protocol was initiated.  His temperature was 100 rectal.    Past Medical History:  Diagnosis Date  . AAA (abdominal aortic aneurysm) ([place])   . Actinic keratoses   . Anxiety   . ASHD (arteriosclerotic heart disease) 1984  . Atrial fibrillation ([place])   . Cancer ([place]) 1999   PROSTATE  . CVA (cerebral vascular accident) ([place])   . Diverticulosis   . Dyslipidemia   . Dysrhythmia   . Personal history of colonic polyps     Past Surgical History:  Procedure Laterality Date  . ABDOMINAL AORTIC ENDOVASCULAR STENT GRAFT N/A 12/11/2015   Procedure: ABDOMINAL AORTIC ENDOVASCULAR STENT GRAFT;  Surgeon: Serafina Mitchell, MD;  Location: [facility];  Service: Vascular;  Laterality: N/A;  . CORONARY ARTERY BYPASS GRAFT  1984   [facility]- Dr. Redmond Pulling x4  . EYE SURGERY Bilateral    cataracts  . ODONTOID FRACTURE SURGERY     Odontoid screw fixation of type 2 odontoid fracture, open reduction and internal fixation of the fracture--patient had fallen down the stairs at 85 years old  . OTHER SURGICAL HISTORY  01/29/1998   Stage TIC adenocarcinoma of the prostate--Surgeon--David, Grapey M.D.--Interstital seed implantation with I-125 seeds--PSA of approximately 7  .  PROSTATE SURGERY  approx 2003    -Grapey  . TONSILLECTOMY      Family History  Problem Relation Age of Onset  . Arthritis Mother   . Heart disease Father   . AAA (abdominal aortic aneurysm) Brother     Social History   Tobacco Use  . Smoking status: Never Smoker  . Smokeless tobacco: Never Used  Substance Use Topics  . Alcohol use: No  . Drug use: No    Prior to Admission medications   Medication Sig Start Date End Date Taking? Authorizing Provider  amoxicillin-clavulanate (AUGMENTIN) 875-125 MG tablet Take 1 tablet by mouth 2 (two) times daily for 5 days. 01/19/19 02/15/2019 Yes [name], MD  bisacodyl (DULCOLAX) 10 MG suppository Place 1 suppository (10 mg total) rectally daily as needed for moderate constipation. 01/19/19  Yes [name], MD  ferrous sulfate 325 (65 FE) MG tablet Take 1 tablet (325 mg total) by mouth daily with breakfast. 01/19/19  Yes [name], MD  metoprolol tartrate (LOPRESSOR) 25 MG tablet Take 0.5 tablets (12.5 mg total) by mouth daily. 01/20/19  Yes [name], MD  pantoprazole (PROTONIX) 40 MG tablet Take 1 tablet (40 mg total) by mouth 2 (two) times daily. 01/08/19 05/08/19 Yes [name], [name], MD  psyllium (METAMUCIL) 58.6 % packet Take 1 packet by mouth daily.   Yes [provider]  rosuvastatin (CRESTOR) 10 MG tablet TAKE 1 TABLET DAILY  Patient taking differently: Take 10 mg by mouth daily.  12/19/18  Yes [name], [name], MD  senna-docusate (SENOKOT-S) 8.6-50 MG tablet Take 1 tablet by mouth at bedtime as needed for mild constipation. 01/19/19  Yes [name], MD    Allergies Asa buff (mag [buffered aspirin] and Zetia [ezetimibe]   REVIEW OF SYSTEMS     PHYSICAL EXAMINATION  Initial Vital Signs Blood pressure (!) 77/47, pulse 93, temperature 100 F (37.8 C), temperature source Rectal, resp. rate (!) 28, SpO2 97 %.  Examination General: Well-developed, well-nourished male in no  acute distress; appearance consistent with age of record HENT: normocephalic; atraumatic Eyes: pupils equal, round and reactive to light; extraocular muscles intact; bilateral pseudophakia Neck: supple Heart: Regular rhythm but no P waves seen Lungs: clear to auscultation bilaterally Abdomen: soft; nondistended; nontender; bowel sounds present Extremities: No deformity; full range of motion; pulses weak; edema of lower legs Neurologic: Awake, alert and oriented x2; motor function intact in all extremities and symmetric; no facial droop Skin: Warm and dry Psychiatric: Flat affect   RESULTS  Summary of this visit's results, reviewed by myself:   EKG Interpretation  Date/Time:  Wednesday January 24 2019 02:12:04 EDT Ventricular Rate:  94 PR Interval:    QRS Duration: 95 QT Interval:  412 QTC Calculation: 516 R Axis:   -36 Text Interpretation:  Atrial flutter Left axis deviation Abnormal R-wave progression, late transition Repol abnrm suggests ischemia, lateral leads Prolonged QT interval No significant change was found Confirmed by Shanon Rosser [id number]) on 02/16/2019 3:18:09 AM      Laboratory Studies: Results for orders placed or performed during the hospital encounter of 02/19/2019 (from the past 24 hour(s))  SARS Coronavirus 2 (CEPHEID- Performed in [facility] hospital lab), Hosp Order     Status: None   Collection Time: 02/16/2019  2:45 AM  Result Value Ref Range   SARS Coronavirus 2 NEGATIVE NEGATIVE  Blood Culture (routine x 2)     Status: None (Preliminary result)   Collection Time: 01/23/2019  2:58 AM  Result Value Ref Range   Specimen Description      BLOOD LEFT FOREARM Performed at [facility], [address] [place], [place] [address]    Special Requests      BOTTLES DRAWN AEROBIC AND ANAEROBIC Blood Culture adequate volume Performed at [facility] [address], [place], [place] [address]    Culture PENDING    Report Status PENDING   Lactic  acid, plasma     Status: None   Collection Time: 01/26/2019  3:00 AM  Result Value Ref Range   Lactic Acid, Venous 1.6 0.5 - 1.9 mmol/L  Comprehensive metabolic panel     Status: Abnormal   Collection Time: 01/23/2019  3:00 AM  Result Value Ref Range   Sodium 140 135 - 145 mmol/L   Potassium 4.1 3.5 - 5.1 mmol/L   Chloride 106 98 - 111 mmol/L   CO2 24 22 - 32 mmol/L   Glucose, Bld 77 70 - 99 mg/dL   BUN 31 (H) 8 - 23 mg/dL   Creatinine, Ser 1.54 (H) 0.61 - 1.24 mg/dL   Calcium 8.1 (L) 8.9 - 10.3 mg/dL   Total Protein 6.6 6.5 - 8.1 g/dL   Albumin 2.1 (L) 3.5 - 5.0 g/dL   AST 39 15 - 41 U/L   ALT 20 0 - 44 U/L   Alkaline Phosphatase 122 38 - 126 U/L   Total Bilirubin 0.8 0.3 -  1.2 mg/dL   GFR calc non Af Amer 38 (L) >60 mL/min   GFR calc Af Amer 44 (L) >60 mL/min   Anion gap 10 5 - 15  CBC WITH DIFFERENTIAL     Status: Abnormal   Collection Time: 02/16/2019  3:00 AM  Result Value Ref Range   WBC 13.6 (H) 4.0 - 10.5 K/uL   RBC 3.25 (L) 4.22 - 5.81 MIL/uL   Hemoglobin 9.0 (L) 13.0 - 17.0 g/dL   HCT 31.4 (L) 39.0 - 52.0 %   MCV 96.6 80.0 - 100.0 fL   MCH 27.7 26.0 - 34.0 pg   MCHC 28.7 (L) 30.0 - 36.0 g/dL   RDW 19.2 (H) 11.5 - 15.5 %   Platelets 392 150 - 400 K/uL   nRBC 0.0 0.0 - 0.2 %   Neutrophils Relative % 93 %   Neutro Abs 12.5 (H) 1.7 - 7.7 K/uL   Lymphocytes Relative 3 %   Lymphs Abs 0.4 (L) 0.7 - 4.0 K/uL   Monocytes Relative 3 %   Monocytes Absolute 0.4 0.1 - 1.0 K/uL   Eosinophils Relative 0 %   Eosinophils Absolute 0.1 0.0 - 0.5 K/uL   Basophils Relative 0 %   Basophils Absolute 0.1 0.0 - 0.1 K/uL   Immature Granulocytes 1 %   Abs Immature Granulocytes 0.10 (H) 0.00 - 0.07 K/uL  Procalcitonin     Status: None   Collection Time: 02/14/2019  3:00 AM  Result Value Ref Range   Procalcitonin 1.59 ng/mL   Imaging Studies: Dg Chest Port 1 View  Result Date: 02/02/2019 CLINICAL DATA:  Sepsis.  Shortness of breath. EXAM: PORTABLE CHEST 1 VIEW COMPARISON:  01/17/2019  FINDINGS: There are hazy bilateral airspace opacities, with some interval redistribution from prior study. The cardiac silhouette is enlarged. Patient is status post prior median sternotomy. A retrocardiac opacity is again noted. There is no large pneumothorax. Trace bilateral pleural effusions are suspected. IMPRESSION: Persistent multifocal airspace opacities concerning for multifocal pneumonia. Electronically Signed   By: Constance Holster M.D.   On: 02/12/2019 03:22    ED COURSE and MDM  Nursing notes and initial vitals signs, including pulse oximetry, reviewed.  Vitals:   02/13/2019 0430 02/09/2019 0500 02/10/2019 0530 02/03/2019 0600  BP: (!) 85/55 (!) 82/50 (!) 81/50 (!) 82/51  Pulse: 84 72 72 73  Resp: (!) 26 (!) 27 (!) 22 (!) 30  Temp:      TempSrc:      SpO2: 97% 96% 96% 95%   4:25 AM Cefepime and vancomycin ordered for healthcare associated pneumonia.   6:21 AM SBP still in the 80s despite 2 L bolus.  Third bolus ordered.  Note that lactate is normal despite hypotension.  Levophed drip ordered.   6:35 AM Discussed with Dr. Madalyn Rob.  PCCM to admit.  PROCEDURES   CRITICAL CARE Performed by: Karen Chafe Leeasia Secrist Total critical care time: 35 minutes Critical care time was exclusive of separately billable procedures and treating other patients. Critical care was necessary to treat or prevent imminent or life-threatening deterioration. Critical care was time spent personally by me on the following activities: development of treatment plan with patient and/or surrogate as well as nursing, discussions with consultants, evaluation of patient's response to treatment, examination of patient, obtaining history from patient or surrogate, ordering and performing treatments and interventions, ordering and review of laboratory studies, ordering and review of radiographic studies, pulse oximetry and re-evaluation of patient's condition.   ED DIAGNOSES  ICD-10-CM   1. HCAP (healthcare-associated  pneumonia) J18.9   2. SIRS (systemic inflammatory response syndrome) ([place]) R65.10        Shamarcus Hoheisel, Jenny Reichmann, MD 02/01/2019 [phone]

## 2019-01-24 NOTE — Progress Notes (Signed)
A consult was received from an ED physician for Vancomycin, cefepime per pharmacy dosing.  The patient's profile has been reviewed for ht/wt/allergies/indication/available labs.   A one time order has been placed for Vancomycin 1.25gm iv x1, and Cefepime 2gm iv x1.  Further antibiotics/pharmacy consults should be ordered by admitting physician if indicated.                       Thank you, Nani Skillern Crowford 02/01/2019  3:55 AM

## 2019-01-24 NOTE — Progress Notes (Signed)
Pharmacy Antibiotic Note  Gary Valencia is a 85 y.o. male admitted on 02/16/2019 with HCAP.  Pharmacy has been consulted for cefepime and vancomycin dosing.  Plan: Cefepime 2 gm IV q24 Vancomycin 1250 mg IV x 1 dose given in ED then DC per Dr Rocky Crafts (24hrs), Avg:100 F (37.8 C), Min:100 F (37.8 C), Max:100 F (37.8 C)  Recent Labs  Lab 01/18/19 0552 02/17/2019 0300  WBC 7.3 13.6*  CREATININE 1.13 1.54*  LATICACIDVEN  --  1.6    Estimated Creatinine Clearance: 25.1 mL/min (A) (by C-G formula based on SCr of 1.54 mg/dL (H)).    Allergies  Allergen Reactions  . Asa Buff (Mag [Buffered Aspirin] Nausea Only  . Zetia [Ezetimibe] Other (See Comments)    Can not remember reaction   Antimicrobials this admission:   6/3 Vancomycin >>  6/3 Cefepime >> PTA augmentin 5/29> 6/3 Dose adjustments this admission:   Microbiology results:  6/3 BCx2>> 6/3 SARS2> negative 6/3 UCx> ordered 6/3 MRSA PCR>ordered Recent admit: 5/27 MRSA PCR neg 5/26 BCx2: NGF 5/26 SARS2> negataive 5/15 MRSA PCR negative 5/15 SARS2 negative  Thank you for allowing pharmacy to be a part of this patient's care. Eudelia Bunch, Pharm.D [id number] 02/18/2019 9:46 AM

## 2019-01-24 NOTE — ED Notes (Signed)
ED TO INPATIENT HANDOFF REPORT  ED Nurse Name and Phone #: Darrick Meigs, RN   S Name/Age/Gender Gary Valencia 85 y.o. male Room/Bed: WA14/WA14  Code Status   Code Status: Full Code  Home/SNF/Other Home Patient oriented to: self, place, time and situation Is this baseline? Yes   Triage Complete: Triage complete  Chief Complaint Respiratory Distress  Triage Note Per EMS - Pt from home, dx of pneumonia and discharged Friday from [facility]. Sats in 70s on RA. Put on NRB and came up to 90s. Slightly altered. Denies any chest pain.    T 100.0 R 44 on NRB  CBG 119 HR 104 130/78 ->73/50    Allergies Allergies  Allergen Reactions  . Asa Buff (Mag [Buffered Aspirin] Nausea Only  . Zetia [Ezetimibe] Other (See Comments)    Can not remember reaction    Level of Care/Admitting Diagnosis ED Disposition    ED Disposition Condition Comment   Admit  Hospital Area: [facility] [[id number]]  Level of Care: ICU [6]  Covid Evaluation: Confirmed COVID Negative  Diagnosis: HCAP (healthcare-associated pneumonia) [384665]  Admitting Physician: Laurin Coder [[id number]]  Attending Physician: PCCM, MD [phone]  Estimated length of stay: 5 - 7 days  Certification:: I certify this patient will need inpatient services for at least 2 midnights  PT Class (Do Not Modify): Inpatient [101]  PT Acc Code (Do Not Modify): Private [1]       B Medical/Surgery History Past Medical History:  Diagnosis Date  . AAA (abdominal aortic aneurysm) ([place])   . Actinic keratoses   . Anxiety   . ASHD (arteriosclerotic heart disease) 1984  . Atrial fibrillation ([place])   . Cancer ([place]) 1999   PROSTATE  . CVA (cerebral vascular accident) ([place])   . Diverticulosis   . Dyslipidemia   . Dysrhythmia   . Personal history of colonic polyps    Past Surgical History:  Procedure Laterality Date  . ABDOMINAL AORTIC ENDOVASCULAR STENT GRAFT N/A 12/11/2015   Procedure: ABDOMINAL AORTIC ENDOVASCULAR  STENT GRAFT;  Surgeon: Serafina Mitchell, MD;  Location: [facility];  Service: Vascular;  Laterality: N/A;  . CORONARY ARTERY BYPASS GRAFT  1984   [facility]- Dr. Redmond Pulling x4  . EYE SURGERY Bilateral    cataracts  . ODONTOID FRACTURE SURGERY     Odontoid screw fixation of type 2 odontoid fracture, open reduction and internal fixation of the fracture--patient had fallen down the stairs at 85 years old  . OTHER SURGICAL HISTORY  01/29/1998   Stage TIC adenocarcinoma of the prostate--Surgeon--David, Grapey M.D.--Interstital seed implantation with I-125 seeds--PSA of approximately 7  . PROSTATE SURGERY  approx 2003   [place] -Grapey  . TONSILLECTOMY       A IV Location/Drains/Wounds Patient Lines/Drains/Airways Status   Active Line/Drains/Airways    Name:   Placement date:   Placement time:   Site:   Days:   Peripheral IV 02/02/2019 Right Forearm   02/08/2019    0300    Forearm   less than 1   Peripheral IV 02/05/2019 Left Forearm   01/30/2019    0302    Forearm   less than 1   External Urinary Catheter   01/08/19    0847    -   16   External Urinary Catheter   01/18/19    1820    -   6   External Urinary Catheter   01/30/2019    0810    -  less than 1   Incision (Closed) 12/11/15 Groin Left   12/11/15    0920     1140   Incision (Closed) 12/11/15 Groin Right   12/11/15    0920     1140   Pressure Injury 01/07/19 Stage I -  Intact skin with non-blanchable redness of a localized area usually over a bony prominence. reddened area over boney prominance; Allevyn intact   01/07/19    1145     17          Intake/Output Last 24 hours  Intake/Output Summary (Last 24 hours) at 02/05/2019 9741 Last data filed at 02/11/2019 6384 Gross per 24 hour  Intake 1256.52 ml  Output -  Net 1256.52 ml    Labs/Imaging Results for orders placed or performed during the hospital encounter of 01/26/2019 (from the past 48 hour(s))  SARS Coronavirus 2 (CEPHEID- Performed in [place] [facility] hospital lab), Hosp Order     Status: None    Collection Time: 02/06/2019  2:45 AM  Result Value Ref Range   SARS Coronavirus 2 NEGATIVE NEGATIVE    Comment: (NOTE) If result is NEGATIVE SARS-CoV-2 target nucleic acids are NOT DETECTED. The SARS-CoV-2 RNA is generally detectable in upper and lower  respiratory specimens during the acute phase of infection. The lowest  concentration of SARS-CoV-2 viral copies this assay can detect is 250  copies / mL. A negative result does not preclude SARS-CoV-2 infection  and should not be used as the sole basis for treatment or other  patient management decisions.  A negative result may occur with  improper specimen collection / handling, submission of specimen other  than nasopharyngeal swab, presence of viral mutation(s) within the  areas targeted by this assay, and inadequate number of viral copies  (<250 copies / mL). A negative result must be combined with clinical  observations, patient history, and epidemiological information. If result is POSITIVE SARS-CoV-2 target nucleic acids are DETECTED. The SARS-CoV-2 RNA is generally detectable in upper and lower  respiratory specimens dur ing the acute phase of infection.  Positive  results are indicative of active infection with SARS-CoV-2.  Clinical  correlation with patient history and other diagnostic information is  necessary to determine patient infection status.  Positive results do  not rule out bacterial infection or co-infection with other viruses. If result is PRESUMPTIVE POSTIVE SARS-CoV-2 nucleic acids MAY BE PRESENT.   A presumptive positive result was obtained on the submitted specimen  and confirmed on repeat testing.  While 2019 novel coronavirus  (SARS-CoV-2) nucleic acids may be present in the submitted sample  additional confirmatory testing may be necessary for epidemiological  and / or clinical management purposes  to differentiate between  SARS-CoV-2 and other Sarbecovirus currently known to infect humans.  If clinically  indicated additional testing with an alternate test  methodology [phone]) is advised. The SARS-CoV-2 RNA is generally  detectable in upper and lower respiratory sp ecimens during the acute  phase of infection. The expected result is Negative. Fact Sheet for Patients:  StrictlyIdeas.no Fact Sheet for Healthcare Providers: BankingDealers.co.za This test is not yet approved or cleared by the [place] FDA and has been authorized for detection and/or diagnosis of SARS-CoV-2 by FDA under an Emergency Use Authorization (EUA).  This EUA will remain in effect (meaning this test can be used) for the duration of the COVID-19 declaration under Section 564(b)(1) of the Act, 21 U.S.C. section 360bbb-3(b)(1), unless the authorization is terminated or revoked sooner. Performed at  [facility], [place] [address] [place], [place] [address]   Blood Culture (routine x 2)     Status: None (Preliminary result)   Collection Time: 02/14/2019  2:58 AM  Result Value Ref Range   Specimen Description      BLOOD LEFT FOREARM Performed at [facility], [place] [address], [place], [place] [address]    Special Requests      BOTTLES DRAWN AEROBIC AND ANAEROBIC Blood Culture adequate volume Performed at [place] [address], [place], [place] [address]    Culture PENDING    Report Status PENDING   Lactic acid, plasma     Status: None   Collection Time: 02/08/2019  3:00 AM  Result Value Ref Range   Lactic Acid, Venous 1.6 0.5 - 1.9 mmol/L    Comment: Performed at [facility], [place] [address], [place], [place] [address]  Comprehensive metabolic panel     Status: Abnormal   Collection Time: 02/03/2019  3:00 AM  Result Value Ref Range   Sodium 140 135 - 145 mmol/L   Potassium 4.1 3.5 - 5.1 mmol/L   Chloride 106 98 - 111 mmol/L   CO2 24 22 - 32 mmol/L   Glucose, Bld 77 70 - 99 mg/dL   BUN 31 (H) 8 -  23 mg/dL   Creatinine, Ser 1.54 (H) 0.61 - 1.24 mg/dL   Calcium 8.1 (L) 8.9 - 10.3 mg/dL   Total Protein 6.6 6.5 - 8.1 g/dL   Albumin 2.1 (L) 3.5 - 5.0 g/dL   AST 39 15 - 41 U/L   ALT 20 0 - 44 U/L   Alkaline Phosphatase 122 38 - 126 U/L   Total Bilirubin 0.8 0.3 - 1.2 mg/dL   GFR calc non Af Amer 38 (L) >60 mL/min   GFR calc Af Amer 44 (L) >60 mL/min   Anion gap 10 5 - 15    Comment: Performed at [facility], [place] [address] [place], [place] [address]  CBC WITH DIFFERENTIAL     Status: Abnormal   Collection Time: 01/29/2019  3:00 AM  Result Value Ref Range   WBC 13.6 (H) 4.0 - 10.5 K/uL   RBC 3.25 (L) 4.22 - 5.81 MIL/uL   Hemoglobin 9.0 (L) 13.0 - 17.0 g/dL   HCT 31.4 (L) 39.0 - 52.0 %   MCV 96.6 80.0 - 100.0 fL   MCH 27.7 26.0 - 34.0 pg   MCHC 28.7 (L) 30.0 - 36.0 g/dL   RDW 19.2 (H) 11.5 - 15.5 %   Platelets 392 150 - 400 K/uL   nRBC 0.0 0.0 - 0.2 %   Neutrophils Relative % 93 %   Neutro Abs 12.5 (H) 1.7 - 7.7 K/uL   Lymphocytes Relative 3 %   Lymphs Abs 0.4 (L) 0.7 - 4.0 K/uL   Monocytes Relative 3 %   Monocytes Absolute 0.4 0.1 - 1.0 K/uL   Eosinophils Relative 0 %   Eosinophils Absolute 0.1 0.0 - 0.5 K/uL   Basophils Relative 0 %   Basophils Absolute 0.1 0.0 - 0.1 K/uL   Immature Granulocytes 1 %   Abs Immature Granulocytes 0.10 (H) 0.00 - 0.07 K/uL    Comment: Performed at [facility], [place] [address] [place],  [address]  Procalcitonin     Status: None   Collection Time: 02/18/2019  3:00 AM  Result Value Ref Range   Procalcitonin 1.59 ng/mL    Comment:  Interpretation: PCT > 0.5 ng/mL and <= 2 ng/mL: Systemic infection (sepsis) is possible, but other conditions are known to elevate PCT as well. (NOTE)       Sepsis PCT Algorithm           Lower Respiratory Tract                                      Infection PCT Algorithm    ----------------------------     ----------------------------         PCT < 0.25 ng/mL                 PCT < 0.10 ng/mL         Strongly encourage             Strongly discourage   discontinuation of antibiotics    initiation of antibiotics    ----------------------------     -----------------------------       PCT 0.25 - 0.50 ng/mL            PCT 0.10 - 0.25 ng/mL               OR       >80% decrease in PCT            Discourage initiation of                                            antibiotics      Encourage discontinuation           of antibiotics    ----------------------------     -----------------------------         PCT >= 0.50 ng/mL              PCT 0.26 - 0.50 ng/mL                AND       <80% decrease in PCT             Encourage initiation of                                             antibiotics       Encourage continuation           of antibiotics    ----------------------------     -----------------------------        PCT >= 0.50 ng/mL                  PCT > 0.50 ng/mL               AND         increase in PCT                  Strongly encourage                                      initiation of antibiotics    Strongly encourage escalation           of antibiotics                                     -----------------------------  PCT <= 0.25 ng/mL                                                 OR                                        > 80% decrease in PCT                                     Discontinue / Do not initiate                                             antibiotics Performed at [place] [address], [place], [place] [address]   Protime-INR     Status: Abnormal   Collection Time: 01/23/2019  7:44 AM  Result Value Ref Range   Prothrombin Time 29.8 (H) 11.4 - 15.2 seconds   INR 2.9 (H) 0.8 - 1.2    Comment: (NOTE) INR goal varies based on device and disease states. Performed at [facility], [place] [address] [place], [place] [address]    Dg Chest  Port 1 View  Result Date: 02/19/2019 CLINICAL DATA:  Sepsis.  Shortness of breath. EXAM: PORTABLE CHEST 1 VIEW COMPARISON:  01/17/2019 FINDINGS: There are hazy bilateral airspace opacities, with some interval redistribution from prior study. The cardiac silhouette is enlarged. Patient is status post prior median sternotomy. A retrocardiac opacity is again noted. There is no large pneumothorax. Trace bilateral pleural effusions are suspected. IMPRESSION: Persistent multifocal airspace opacities concerning for multifocal pneumonia. Electronically Signed   By: Constance Holster M.D.   On: 01/22/2019 03:22    Pending Labs Unresulted Labs (From admission, onward)    Start     Ordered   01/25/19 0500  CBC  Tomorrow morning,   R     02/10/2019 0657   01/25/19 4709  Basic metabolic panel  Tomorrow morning,   R     02/05/2019 0657   01/25/19 0500  Magnesium  Tomorrow morning,   R     02/15/2019 0657   01/25/19 0500  Phosphorus  Tomorrow morning,   R     02/18/2019 0657   01/23/2019 0732  MRSA PCR Screening  Once,   R     02/01/2019 0731   02/05/2019 0233  Lactic acid, plasma  Now then every 2 hours,   STAT     01/25/2019 0234   02/02/2019 0233  Blood Culture (routine x 2)  BLOOD CULTURE X 2,   STAT     01/26/2019 0234   02/14/2019 0233  Urinalysis, Routine w reflex microscopic  ONCE - STAT,   STAT     01/26/2019 0234   02/18/2019 0233  Urine culture  ONCE - STAT,   STAT     01/23/2019 0234          Vitals/Pain Today's Vitals   01/23/2019 0715 02/18/2019 0733 01/22/2019 0745 02/05/2019 0802  BP: 107/65 (!) 92/57 (!) 93/58 91/62  Pulse: 74 73 69 72  Resp: (!) 31  17 (!) 27 (!) 29  Temp:      TempSrc:      SpO2: 91% 93% 93% 96%  PainSc:        Isolation Precautions No active isolations  Medications Medications  0.9 %  sodium chloride infusion (has no administration in time range)  norepinephrine (LEVOPHED) 4 mg in dextrose 5 % 250 mL (0.016 mg/mL) infusion (has no administration in time range)  heparin injection  5,000 Units (has no administration in time range)  0.9 %  sodium chloride infusion ( Intravenous New Bag/Given 02/04/2019 0743)  calcium gluconate 1 g/ 50 mL sodium chloride IVPB (has no administration in time range)  sodium chloride 0.9 % bolus 1,000 mL (0 mLs Intravenous Stopped 02/14/2019 0442)    And  sodium chloride 0.9 % bolus 1,000 mL (0 mLs Intravenous Stopped 01/22/2019 0615)  ceFEPIme (MAXIPIME) 2 g in sodium chloride 0.9 % 100 mL IVPB (0 g Intravenous Stopped 01/29/2019 0517)  vancomycin (VANCOCIN) 1,250 mg in sodium chloride 0.9 % 250 mL IVPB (0 mg Intravenous Stopped 02/12/2019 0817)  sodium chloride 0.9 % bolus 1,000 mL (0 mLs Intravenous Stopped 02/11/2019 0817)    Mobility non-ambulatory Moderate fall risk   Focused Assessments Pulmonary Assessment Handoff:  Lung sounds: Bilateral Breath Sounds: Diminished, Expiratory wheezes O2 Device: NRB O2 Flow Rate (L/min): 15 L/min      R Recommendations: See Admitting Provider Note  Report given to:   Additional Notes: N/A

## 2019-01-24 NOTE — Consult Note (Signed)
Consultation Note Date: 01/22/2019   Patient Name: Gary Valencia  DOB: 02-Mar-1923  MRN: [id number]  Age / Sex: 85 y.o., male  PCP: [name], MD Referring Physician: Laurin Coder, MD  Reason for Consultation: Establishing goals of care and Psychosocial/spiritual support  HPI/Patient Profile: 85 y.o. male  with past medical history of pharyngeal dysphagia, CVA, prostate CA, CAD s/p CABG, AAA, anxiety and afib who was admitted on 02/20/2019 with multifocal pneumonia and coagulopathy.   Mr. Palmeri has had two recent admissions 5/15 (anemia) and then 5/26 (hypoxia secondary to aspiration).  Palliative care met with Mr. Bartunek during his most recent admission.  Clinical Assessment and Goals of Care:  I have reviewed medical records including EPIC notes, labs and imaging, and spoke on the phone with his son, B.G. [facility]) to discuss diagnosis prognosis, GOC, EOL wishes, disposition and options.    B.G. 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  Mr. Erekson liked to be outside and active.    As far as functional and nutritional status, B.G. talked about the recent, rapid decline his father experienced.  Specifically we discussed recurrent aspiration as a terminal illness.  B.G. said he understood.  Hospice and Palliative Care services outpatient were explained and offered.  I suggested to B.G. that if we are able to help his father get better from this acute illness, perhaps he could take him home with hospice services.  We know that the aspiration will happen again - we just don't know when.  B.G. felt his father would be happier spending his time final hours at home with his family rather than in the ER or ICU.  B.G. felt this was a good idea.  Questions and concerns were addressed.   The family was encouraged to call with questions or concerns.  PMT committed to follow up with family tomorrow morning.    Primary Decision Maker:  HCPOA son B.G.    SUMMARY OF RECOMMENDATIONS    CCM NP Brandi talked with family earlier.  Code status is DNR.  If he declines shift to comfort.  For now, full scope treatment.  Family hopeful patient will come home to spend time with family.  Hospice services to be provided in patient's home on discharge.  PMT will follow up with family tomorrow morning.  Code Status/Advance Care Planning:  DNR   Palliative Prophylaxis:   Aspiration  Psycho-social/Spiritual:   Desire for further Chaplaincy support: not discussed.  Prognosis:  Likely days to weeks if he survives this acute episode.   Discharge Planning: Home with Hospice      Primary Diagnoses: Present on Admission: . HCAP (healthcare-associated pneumonia)   I have reviewed the medical record, interviewed the patient and family, and examined the patient. The following aspects are pertinent.  Past Medical History:  Diagnosis Date  . AAA (abdominal aortic aneurysm) ([place])   . Actinic keratoses   .  Anxiety   . ASHD (arteriosclerotic heart disease) 1984  . Atrial fibrillation ([place])   . Cancer ([place]) 1999   PROSTATE  . CVA (cerebral vascular accident) ([place])   . Diverticulosis   . Dyslipidemia   . Dysphagia    Moderate to severe on MBS 01/08/19  . Dysrhythmia   . Personal history of colonic polyps    Social History   Socioeconomic History  . Marital status: Married    Spouse name: Not on file  . Number of children: Not on file  . Years of education: Not on file  . Highest education level: Not on file  Occupational History  . Not on file  Social Needs  . Financial resource strain: Not on file  . Food insecurity:    Worry: Not on file    Inability: Not on file  . Transportation needs:    Medical: Not on file    Non-medical: Not on file  Tobacco Use  .  Smoking status: Never Smoker  . Smokeless tobacco: Never Used  Substance and Sexual Activity  . Alcohol use: No  . Drug use: No  . Sexual activity: Not on file  Lifestyle  . Physical activity:    Days per week: Not on file    Minutes per session: Not on file  . Stress: Not on file  Relationships  . Social connections:    Talks on phone: Not on file    Gets together: Not on file    Attends religious service: Not on file    Active member of club or organization: Not on file    Attends meetings of clubs or organizations: Not on file    Relationship status: Not on file  Other Topics Concern  . Not on file  Social History Narrative  . Not on file   Family History  Problem Relation Age of Onset  . Arthritis Mother   . Heart disease Father   . AAA (abdominal aortic aneurysm) Brother    Scheduled Meds: . chlorhexidine  15 mL Mouth Rinse BID  . [START ON 01/25/2019] Chlorhexidine Gluconate Cloth  6 each Topical Daily  . heparin  5,000 Units Subcutaneous Q8H  . mouth rinse  15 mL Mouth Rinse q12n4p   Continuous Infusions: . sodium chloride    . [START ON 01/25/2019] ceFEPime (MAXIPIME) IV    . dextrose 5 % and 0.9% NaCl 75 mL/hr at 02/03/2019 1603   Followed by  . dextrose 5 % and 0.9% NaCl     PRN Meds:.acetaminophen, ondansetron (ZOFRAN) IV Allergies  Allergen Reactions  . Asa Buff (Mag [Buffered Aspirin] Nausea Only  . Zetia [Ezetimibe] Other (See Comments)    Can not remember reaction    Vital Signs: BP (!) 116/57   Pulse 77   Temp 98.2 F (36.8 C) (Axillary)   Resp (!) 27   Ht _0  (1.702 m)   Wt 62 kg   SpO2 98%   BMI 21.41 kg/m  Pain Scale: 0-10   Pain Score: Asleep   SpO2: SpO2: 98 % O2 Device:SpO2: 98 % O2 Flow Rate: .O2 Flow Rate (L/min): 15 L/min  IO: Intake/output summary:   Intake/Output Summary (Last 24 hours) at 01/22/2019 1651 Last data filed at 01/23/2019 1604 Gross per 24 hour  Intake 2310.35 ml  Output -  Net 2310.35 ml    LBM: Last BM  Date: (UTA) Baseline Weight: Weight: 62 kg Most recent weight: Weight: 62 kg     Palliative Assessment/Data:  20%     Time In: 4:15 pm Time Out: 5:10 pm. Time Total: 55 min. Visit consisted of counseling and education dealing with the complex and emotionally intense issues surrounding the need for palliative care and symptom management in the setting of serious and potentially life-threatening illness. Greater than 50%  of this time was spent counseling and coordinating care related to the above assessment and plan.  The above conversation was completed via telephone due to the visitor restrictions during the COVID-19 pandemic. Thorough chart review and discussion with necessary members of the care team was completed as part of assessment. All issues were discussed and addressed but no physical exam was performed.  Signed by: Florentina Jenny, PA-C Palliative Medicine Pager: [phone]  Please contact Palliative Medicine Team phone at [phone] for questions and concerns.  For individual provider: See Shea Evans

## 2019-01-24 NOTE — Progress Notes (Addendum)
CRITICAL VALUE ALERT  Critical Value:  CBG 69  Date & Time Notied:  4599, 02/02/2019   Provider Notified: Noe Gens, NP  Orders Received/Actions taken: Treated per protocol woth 65ml of D50, Noe Gens NP changed fluids to D5NS at 62ml/hr. Will recheck blood glucose in 30 minutes  Update: CBG rechecked, result: 106

## 2019-01-24 NOTE — ED Triage Notes (Signed)
Per EMS - Pt from home, dx of pneumonia and discharged Friday from [facility]. Sats in 70s on RA. Put on NRB and came up to 90s. Slightly altered. Denies any chest pain.    T 100.0 R 44 on NRB  CBG 119 HR 104 130/78 ->73/50

## 2019-01-24 NOTE — H&P (Signed)
NAME:  Gary Valencia, MRN:  [id number], DOB:  [birth year], LOS: 0 ADMISSION DATE:  01/26/2019, CONSULTATION DATE:  02/01/2019 REFERRING MD:  Dr. Florina Ou, CHIEF COMPLAINT:  SOB, hypoxemia  Brief History   85 y/o M readmitted with SOB, desaturation on RA.  Bilateral infiltrates on imaging.    History of present illness   85 y/o M who presented to [facility] on 6/3 with reports of increased SOB and decreased oxygen saturations at home.   He has had two recent hospitalizations with the most recent admit from 5/26-5/29 with SOB.  Symptoms were thought to be in the setting of recurrent aspiration with concomitant CHF.  He was treated with IV antibiotics and diuretics.  He has been negative for COVID x3 admissions multiple days apart.  On 5/18, he had an MBS that was consistent with moderate to severe pharyngeal dysphagia.  Per patients son, he lives at home with his wife.  He uses a walker to ambulate.  Reports that the patient "had a good day" on 6/2 but his oxygen saturations dropped over night.  Has to use a lift chair occasionally to stand.  Son reports he has had a decline over the last year.  Specifically in the  He returns 6/3 with recurrent episode of SOB and low oxygen saturations on RA.  EMS applied NRB with improvement in saturations. He was documented as confused on admit with low BP and tachypnea.  Rectal temperature of 100.  CXR was concerning for bilateral airspace opacities.  He was admitted for evaluation of possible recurrent aspiration PNA.  Admit labs- Na 140, K 4.1, cl 106, CO2 24, BUN 31, Sr Cr 1.54, albumin 2.1, WBC 13.6, Hgb 9 and platelets 392.   Past Medical History  CVA AF - on coumadin AAA Prostate Cancer  CAD s/p CABG HLD Anxiety Dysphagia - moderate to severe 01/08/19 [facility] Events   6/03  Admit with SOB, hypoxemia  Consults:  PCCM   Procedures:    Significant Diagnostic Tests:    Micro Data:  COVID 6/3 >> negative  BCx2 6/3 >> UC 6/3 >>    Antimicrobials:  Cefepime 6/3 >>  Interim history/subjective:  RN reports pt remains on NRB.  Not on pressors, no acute events.   Objective   Blood pressure (!) 84/52, pulse 66, temperature 100 F (37.8 C), temperature source Rectal, resp. rate 12, SpO2 100 %.        Intake/Output Summary (Last 24 hours) at 02/09/2019 1025 Last data filed at 02/10/2019 0817 Gross per 24 hour  Intake 1256.52 ml  Output -  Net 1256.52 ml   There were no vitals filed for this visit.  Examination: General: frail elderly male in NAD lying in bed HEENT: MM pink/dry, mouth open, pupils reactive, arcus senilis  Neuro: awakens, opens eyes to voice, generalized weakness but moves extremities, oriented to self   CV: s1s2 rrr, no m/r/g PULM: even/non-labored, lungs bilaterally with crackles AQ:TMAU, non-tender, bsx4 active  Extremities: warm/dry, trace LE edema, changes c/w chronic venous insufficiency   Skin: no rashes or lesions  Resolved Hospital Problem list      Assessment & Plan:    Acute Hypoxemic Respiratory Failure  -in setting of suspected recurrent aspiration P: Wean O2 for sats >90% Pulmonary hygiene - IS, mobilize  Follow intermittent CXR  Aspiration precautions  NPO x meds until mental status improves more  Prior MBS 5/18 with rec's for D3 diet and thin liquids   Dysphagia  -  most recent MBS with moderate to severe dysphagia, rec's for D3 / thin liquids P: Repeat SLP evaluation given overall decline  AKI P: NS at 69ml/hr for now > cautious fluid  Trend BMP / urinary output Replace electrolytes as indicated Avoid nephrotoxic agents, ensure adequate renal perfusion  dCHF  CAD s/p CABG HTN HLD AF - not on anticoagulation AAA P: Hold home lopressor Tele monitoring  No anticoagulation  > elevated INR   Coagulopathy  P: Follow INR  ? If family were still giving coumadin > was not supposed to be on due to GIB, symptomatic anemia  Goals of Care P: Discussed with son  via phone 6/3 regarding overall state of health, patients decline over the past months. They agree that if there are interventions that can reverse his illness they are willing to try up to the point of things that cause great discomfort (vent, CPR, central lines etc).  If he declines, they would want comfort measures.  DNR placed.     Best practice:  Diet: NPO x sips with meds  Pain/Anxiety/Delirium protocol (if indicated): n/a  VAP protocol (if indicated): n/a  DVT prophylaxis: Coumadin  GI prophylaxis: n/a  Glucose control: n/a  Mobility: as tolerated  Code Status: DNR  Family Communication: Son updated in detail on patients status and plan of care 6/3.  Disposition: ICU, to [facility] as of 6/4.  Labs   CBC: Recent Labs  Lab 01/18/19 0552 02/12/2019 0300  WBC 7.3 13.6*  NEUTROABS  --  12.5*  HGB 9.8* 9.0*  HCT 33.0* 31.4*  MCV 94.3 96.6  PLT 380 629    Basic Metabolic Panel: Recent Labs  Lab 01/18/19 0552 02/06/2019 0300  NA 141 140  K 3.9 4.1  CL 103 106  CO2 29 24  GLUCOSE 86 77  BUN 20 31*  CREATININE 1.13 1.54*  CALCIUM 8.6* 8.1*  MG 2.0  --    GFR: Estimated Creatinine Clearance: 25.1 mL/min (A) (by C-G formula based on SCr of 1.54 mg/dL (H)). Recent Labs  Lab 01/18/19 0552 02/04/2019 0300 02/14/2019 0936  PROCALCITON  --  1.59  --   WBC 7.3 13.6*  --   LATICACIDVEN  --  1.6 1.3    Liver Function Tests: Recent Labs  Lab 01/26/2019 0300  AST 39  ALT 20  ALKPHOS 122  BILITOT 0.8  PROT 6.6  ALBUMIN 2.1*   No results for input(s): LIPASE, AMYLASE in the last 168 hours. No results for input(s): AMMONIA in the last 168 hours.  ABG    Component Value Date/Time   PHART 7.452 (H) 12/03/2015 0948   PCO2ART 36.1 12/03/2015 0948   PO2ART 82.2 12/03/2015 0948   HCO3 24.8 (H) 12/03/2015 0948   TCO2 25.9 12/03/2015 0948   O2SAT 95.8 12/03/2015 0948     Coagulation Profile: Recent Labs  Lab 01/18/19 0552 01/19/19 0545 02/14/2019 0744  INR 5.9* 5.8* 2.9*     Cardiac Enzymes: No results for input(s): CKTOTAL, CKMB, CKMBINDEX, TROPONINI in the last 168 hours.  HbA1C: Hgb A1c MFr Bld  Date/Time Value Ref Range Status  01/16/2019 08:41 PM 5.3 4.8 - 5.6 % Final    Comment:    (NOTE) Pre diabetes:          5.7%-6.4% Diabetes:              >6.4% Glycemic control for   <7.0% adults with diabetes     CBG: Recent Labs  Lab 01/18/19 0715 02/05/2019 0846  GLUCAP 72 87    Review of Systems:   Unable to complete with patient due to AMS.  Information obtained from prior medical documentation and patients chart.   Past Medical History  He,  has a past medical history of AAA (abdominal aortic aneurysm) ([place]), Actinic keratoses, Anxiety, ASHD (arteriosclerotic heart disease) (1984), Atrial fibrillation ([place]), Cancer ([place]) (1999), CVA (cerebral vascular accident) ([place]), Diverticulosis, Dyslipidemia, Dysrhythmia, and Personal history of colonic polyps.   Surgical History    Past Surgical History:  Procedure Laterality Date  . ABDOMINAL AORTIC ENDOVASCULAR STENT GRAFT N/A 12/11/2015   Procedure: ABDOMINAL AORTIC ENDOVASCULAR STENT GRAFT;  Surgeon: Serafina Mitchell, MD;  Location: [place];  Service: Vascular;  Laterality: N/A;  . CORONARY ARTERY BYPASS GRAFT  1984   [facility]- Dr. Redmond Pulling x4  . EYE SURGERY Bilateral    cataracts  . ODONTOID FRACTURE SURGERY     Odontoid screw fixation of type 2 odontoid fracture, open reduction and internal fixation of the fracture--patient had fallen down the stairs at 85 years old  . OTHER SURGICAL HISTORY  01/29/1998   Stage TIC adenocarcinoma of the prostate--Surgeon--David, Grapey M.D.--Interstital seed implantation with I-125 seeds--PSA of approximately 7  . PROSTATE SURGERY  approx 2003   [place] -Grapey  . TONSILLECTOMY       Social History   reports that he has never smoked. He has never used smokeless tobacco. He reports that he does not drink alcohol or use drugs.   Family History   His family history  includes AAA (abdominal aortic aneurysm) in his brother; Arthritis in his mother; Heart disease in his father.   Allergies Allergies  Allergen Reactions  . Asa Buff (Mag [Buffered Aspirin] Nausea Only  . Zetia [Ezetimibe] Other (See Comments)    Can not remember reaction     Home Medications  Prior to Admission medications   Medication Sig Start Date End Date Taking? Authorizing Provider  amoxicillin-clavulanate (AUGMENTIN) 875-125 MG tablet Take 1 tablet by mouth 2 (two) times daily for 5 days. 01/19/19 02/18/2019 Yes [name], MD  bisacodyl (DULCOLAX) 10 MG suppository Place 1 suppository (10 mg total) rectally daily as needed for moderate constipation. 01/19/19  Yes [name], MD  ferrous sulfate 325 (65 FE) MG tablet Take 1 tablet (325 mg total) by mouth daily with breakfast. 01/19/19  Yes [name], MD  metoprolol tartrate (LOPRESSOR) 25 MG tablet Take 0.5 tablets (12.5 mg total) by mouth daily. 01/20/19  Yes [name], MD  pantoprazole (PROTONIX) 40 MG tablet Take 1 tablet (40 mg total) by mouth 2 (two) times daily. 01/08/19 05/08/19 Yes [name], [name], MD  psyllium (METAMUCIL) 58.6 % packet Take 1 packet by mouth daily.   Yes [provider]  rosuvastatin (CRESTOR) 10 MG tablet TAKE 1 TABLET DAILY Patient taking differently: Take 10 mg by mouth daily.  12/19/18  Yes [name], [name], MD  senna-docusate (SENOKOT-S) 8.6-50 MG tablet Take 1 tablet by mouth at bedtime as needed for mild constipation. 01/19/19  Yes [name], MD     Critical care time: n/a     Noe Gens, NP-C [facility] Pgr: [phone] or if no answer [phone] 02/18/2019, 10:25 AM

## 2019-01-25 ENCOUNTER — Inpatient Hospital Stay (HOSPITAL_COMMUNITY): Payer: Medicare Other

## 2019-01-25 DIAGNOSIS — J9601 Acute respiratory failure with hypoxia: Secondary | ICD-10-CM

## 2019-01-25 LAB — BASIC METABOLIC PANEL
Anion gap: 6 (ref 5–15)
BUN: 25 mg/dL — ABNORMAL HIGH (ref 8–23)
CO2: 21 mmol/L — ABNORMAL LOW (ref 22–32)
Calcium: 7.9 mg/dL — ABNORMAL LOW (ref 8.9–10.3)
Chloride: 117 mmol/L — ABNORMAL HIGH (ref 98–111)
Creatinine, Ser: 1 mg/dL (ref 0.61–1.24)
GFR calc Af Amer: >60 mL/min (ref >60)
GFR calc non Af Amer: >60 mL/min (ref >60)
Glucose, Bld: 103 mg/dL — ABNORMAL HIGH (ref 70–99)
Potassium: 3.8 mmol/L (ref 3.5–5.1)
Sodium: 144 mmol/L (ref 135–145)

## 2019-01-25 LAB — GLUCOSE, CAPILLARY
Glucose-Capillary: 102 mg/dL — ABNORMAL HIGH (ref 70–99)
Glucose-Capillary: 80 mg/dL (ref 70–99)
Glucose-Capillary: 80 mg/dL (ref 70–99)
Glucose-Capillary: 82 mg/dL (ref 70–99)
Glucose-Capillary: 86 mg/dL (ref 70–99)
Glucose-Capillary: 98 mg/dL (ref 70–99)

## 2019-01-25 LAB — CBC
HCT: 29.8 % — ABNORMAL LOW (ref 39.0–52.0)
Hemoglobin: 8.6 g/dL — ABNORMAL LOW (ref 13.0–17.0)
MCH: 28.9 pg (ref 26.0–34.0)
MCHC: 28.9 g/dL — ABNORMAL LOW (ref 30.0–36.0)
MCV: 100 fL (ref 80.0–100.0)
Platelets: 362 10*3/uL (ref 150–400)
RBC: 2.98 MIL/uL — ABNORMAL LOW (ref 4.22–5.81)
RDW: 19.6 % — ABNORMAL HIGH (ref 11.5–15.5)
WBC: 9.4 10*3/uL (ref 4.0–10.5)
nRBC: 0 % (ref 0.0–0.2)

## 2019-01-25 LAB — PROTIME-INR
INR: 3.8 — ABNORMAL HIGH (ref 0.8–1.2)
Prothrombin Time: 37.1 seconds — ABNORMAL HIGH (ref 11.4–15.2)

## 2019-01-25 LAB — MAGNESIUM: Magnesium: 2 mg/dL (ref 1.7–2.4)

## 2019-01-25 LAB — PHOSPHORUS: Phosphorus: 3.3 mg/dL (ref 2.5–4.6)

## 2019-01-25 MED ORDER — CHLORHEXIDINE GLUCONATE 0.12 % MT SOLN
15.0000 mL | Freq: Two times a day (BID) | OROMUCOSAL | Status: DC
  Administered 2019-01-25 – 2019-01-26 (×4): 15 mL via OROMUCOSAL

## 2019-01-25 MED ORDER — SODIUM CHLORIDE 0.9 % IV SOLN
2.0000 g | Freq: Two times a day (BID) | INTRAVENOUS | Status: DC
  Administered 2019-01-25 – 2019-01-26 (×2): 2 g via INTRAVENOUS
  Filled 2019-01-25 (×2): qty 2

## 2019-01-25 MED ORDER — LORAZEPAM 2 MG/ML IJ SOLN
0.5000 mg | Freq: Once | INTRAMUSCULAR | Status: AC
  Administered 2019-01-25: 0.5 mg via INTRAVENOUS
  Filled 2019-01-25: qty 1

## 2019-01-25 MED ORDER — ORAL CARE MOUTH RINSE
15.0000 mL | Freq: Two times a day (BID) | OROMUCOSAL | Status: DC
  Administered 2019-01-25 (×2): 15 mL via OROMUCOSAL

## 2019-01-25 NOTE — Progress Notes (Signed)
Pharmacy Antibiotic Note  Gary Valencia is a 85 y.o. male admitted on 02/03/2019 with HCAP.  Pharmacy has been consulted for cefepime dosing. 01/25/2019  Afebrile, WBC 9.4, SCr improved to 1   Plan: Increase Cefepime to 2 gm IV q12   Height: 5\' 7"  (170.2 cm) Weight: 151 lb 3.8 oz (68.6 kg)(possible error) IBW/kg (Calculated) : 66.1  Temp (24hrs), Avg:97.1 F (36.2 C), Min:95.7 F (35.4 C), Max:98.2 F (36.8 C)  Recent Labs  Lab 01/30/2019 0300 02/17/2019 0936 01/25/19 0311  WBC 13.6*  --  9.4  CREATININE 1.54*  --  1.00  LATICACIDVEN 1.6 1.3  --     Estimated Creatinine Clearance: 42.2 mL/min (by C-G formula based on SCr of 1 mg/dL).    Allergies  Allergen Reactions  . Asa Buff (Mag [Buffered Aspirin] Nausea Only  . Zetia [Ezetimibe] Other (See Comments)    Can not remember reaction  Antimicrobials this admission:   6/3 Vancomycin x 1 dose  6/3 Cefepime >> PTA augmentin 5/29> 6/3 Dose adjustments this admission:  6/4 cefepime incr to 2 gm q2 Microbiology results:  6/3 BCx2>>ngtd 6/3 SARS2> negative 6/3 UCx> ordered, not sent Recent admit: 5/27 MRSA PCR neg 5/26 BCx2: NGF 5/26 SARS2> negataive 5/15 MRSA PCR negative 5/15 SARS2 negative  Thank you for allowing pharmacy to be a part of this patient's care.  Eudelia Bunch, Pharm.D 01/25/2019 10:45 AM

## 2019-01-25 NOTE — Progress Notes (Addendum)
Daily Progress Note   Patient Name: Gary Valencia       Date: 01/25/2019 DOB: 04/24/1923  Age: 85 y.o. MRN#: [id number] Attending Physician: Dessa Phi, DO Primary Care Physician: [name], MD Admit Date: 01/22/2019  Reason for Consultation/Follow-up: Establishing goals of care and Psychosocial/spiritual support  Subjective: Gary Valencia is difficult to understand thru his non rebreather mask, but he tells me his name and that he is in the hospital with pneumonia.  We attempted to call his son and wife together several times but did not receive an answer.  Gary Valencia reports he is not in any pain, but generally feels bad.   After speaking with the patient I spoke with his wife and son on the phone.  They understand the nature of aspiration, but felt Gary Valencia had his "swallowing problem licked" because he was not coughing when he swallowed.  We discussed silent aspiration.  Further we discussed remaining NPO versus attempting feeding understanding the risk of aspiration.  Both wife and son were very definitive that they want him fed as safely as possible, but "If it takes him out we understand".    Also per B.G. (son) patient broke his neck years ago and has a screw in his neck.  Recently he has had a sort throat - on the right side of his throat.  Assessment: 85 yo male with recurrent aspiration pneumonia.  Under a Retail banker.  On non-rebreather mask at 15L.  Respirations are 30-31 per minute.  Family understands the nature of the problem and wants him care for as well as possible with a leaning towards comfort.   Patient Profile/HPI:  85 y.o. male  with past medical history of pharyngeal dysphagia, CVA, prostate CA, CAD s/p CABG, AAA, anxiety and afib who was admitted on 01/29/2019 with  multifocal pneumonia and coagulopathy.   Gary Valencia has had two recent admissions 5/15 (anemia) and then 5/26 (hypoxia secondary to aspiration).  Palliative care met with Gary Valencia during his most recent admission.    Length of Stay: 1  Current Medications: Scheduled Meds:   chlorhexidine  15 mL Mouth Rinse BID   Chlorhexidine Gluconate Cloth  6 each Topical Daily   heparin  5,000 Units Subcutaneous Q8H   mouth rinse  15 mL Mouth Rinse q12n4p    Continuous  Infusions:  sodium chloride     ceFEPime (MAXIPIME) IV Stopped (01/25/19 0530)   dextrose 5 % and 0.9% NaCl 40 mL/hr (01/25/19 0025)    PRN Meds: acetaminophen, ondansetron (ZOFRAN) IV  Physical Exam        Well developed well preserved 85 yo male (still doing yard work).  Alert, orientated, difficult to understand but co-herent CV tachy Resp no distress but rapid on 15L with NRB mask Abdomen soft, thin, nt, nd   Vital Signs: BP (!) 122/59    Pulse (!) 106    Temp 98.1 F (36.7 C) (Oral)    Resp (!) 35    Ht 5' 7" (1.702 m)    Wt 68.6 kg Comment: possible error   SpO2 100%    BMI 23.69 kg/m  SpO2: SpO2: 100 % O2 Device: O2 Device: NRB O2 Flow Rate: O2 Flow Rate (L/min): 15 L/min  Intake/output summary:   Intake/Output Summary (Last 24 hours) at 01/25/2019 0758 Last data filed at 01/25/2019 0530 Gross per 24 hour  Intake 3065.29 ml  Output --  Net 3065.29 ml   LBM: Last BM Date: (UTA) Baseline Weight: Weight: 62 kg Most recent weight: Weight: 68.6 kg(possible error)       Palliative Assessment/Data:  50%      Patient Active Problem List   Diagnosis Date Noted   Pharyngeal dysphagia    Palliative care encounter    Encounter for hospice care discussion    Aspiration into airway    Palliative care by specialist    Goals of care, counseling/discussion    SOB (shortness of breath)    Acute respiratory failure with hypoxia ([place]) 01/16/2019   HCAP (healthcare-associated pneumonia)  01/16/2019   Pressure injury of skin 01/08/2019   Severe anemia 01/05/2019   Anemia 01/05/2019   Herpes labialis 09/08/2017   Personal history of colonic polyps    Dysrhythmia    Dyslipidemia    Diverticulosis    Atrial fibrillation ([facility])    Anxiety    Actinic keratoses    AAA (abdominal aortic aneurysm) without rupture ([place]) 12/11/2015   CAD - S/P CABG in 1984 09/13/2012   HTN (hypertension) 09/13/2012   Chronic anticoagulation -Warfarin 09/13/2012   Palpitations 09/13/2012   Atrial fibrillation, permanent 10/11/2011   ASHD (arteriosclerotic heart disease) 10/11/2011   History of prostate cancer 10/11/2011   AAA (4.09 cm on last [place] -2013) 10/11/2011   Hyperlipidemia 10/11/2011   Cancer ([place]) 08/23/1997    Palliative Care Plan    Recommendations/Plan:  DNR  Per son and wife - please feed safest diet in safest way possible.  Understand and accept risk of aspiration  Up to chair if possible.  Goal is to medically optimize and then home with hospice.  Goals of Care and Additional Recommendations:  Limitations on Scope of Treatment: Full Scope Treatment, but do not escalate care.  Code Status:  DNR  Prognosis:   Unable to determine likely days to weeks given recurrent multifocal pneumonia   Discharge Planning:  Home with Hospice when medically optimized  Care plan was discussed with family (wife / son), TRH MD.  Thank you for allowing the Palliative Medicine Team to assist in the care of this patient.  Total time spent:  35 min.     Greater than 50%  of this time was spent counseling and coordinating care related to the above assessment and plan.  Florentina Jenny, PA-C Palliative Medicine  Please contact Palliative MedicineTeam phone at [phone] for  concerns between 7 am - 7 pm.   Please see AMION for individual provider pager numbers. ° ° ° ° ° ° °

## 2019-01-25 NOTE — Progress Notes (Signed)
PROGRESS NOTE    Gary Valencia  ZOX:[id number] DOB: [birth year] DOA: 02/15/2019 PCP: [name], MD     Brief Narrative:  Gary Valencia is a 85 year old male with past medical history of atrial fibrillation on Coumadin, AAA, history of prostate cancer, CAD status post CABG, hyperlipidemia who presented to the hospital on 02/09/2019 with reports of increased shortness of breath and decreased oxygen saturation at home.  He has had 2 recent hospitalization with most recent admit from 5/26 to 5/29 with shortness of breath.  At that time, symptoms were thought to be in setting of recurrent aspiration with concomitant CHF.  He was given IV antibiotics and diuretics at that time.  On 5/18, he had MBS that was consistent with moderate to severe pharyngeal dysphagia.  In the emergency room, his x-ray revealed bilateral airspace opacities.  He was admitted to [facility] service for recurrent aspiration pneumonia with hypotension.  His blood pressure eventually stabilized and patient was transferred to [facility] service on 6/4.   New events last 24 hours / Subjective: Patient wearing nonrebreather mask satting 100%, appears to be comfortable.  Palliative care medicine also at bedside during my examination.  Discussed aspiration risk with him.  Assessment & Plan:   Active Problems:   HCAP (healthcare-associated pneumonia)   Pharyngeal dysphagia   Palliative care encounter   Encounter for hospice care discussion   Acute hypoxemic respiratory failure -Continue to wean oxygen as able  Sepsis secondary to recurrent aspiration pneumonia -Sepsis present on admission, WBC normal now  -MBS in May 2020 revealed moderate to severe dysphasia, recommended for dysphagia 3 diet -Blood cultures negative to date -SARS-CoV-2 negative -Repeat CXR showed stable bilateral infiltrates R>L  -Cefepime   AKI -Creatinine back to baseline -IVF until PO intake adequate   Chronic diastolic heart failure -Stable    Paroxysmal atrial fibrillation -Not currently on anticoagulation due to GI bleed -Elevated INR 3.8.  Unclear if patient has been receiving Coumadin at home    DVT prophylaxis: SCD Code Status: DNR Family Communication: None Disposition Plan: Pending further improvement in oxygenation, planning for home with home hospice on discharge   Consultants:   PCCM  Palliative care medicine  Procedures:   None  Antimicrobials:  Anti-infectives (From admission, onward)   Start     Dose/Rate Route Frequency Ordered Stop   01/25/19 1800  ceFEPIme (MAXIPIME) 2 g in sodium chloride 0.9 % 100 mL IVPB     2 g 200 mL/hr over 30 Minutes Intravenous Every 12 hours 01/25/19 1047     01/25/19 0400  ceFEPIme (MAXIPIME) 2 g in sodium chloride 0.9 % 100 mL IVPB  Status:  Discontinued     2 g 200 mL/hr over 30 Minutes Intravenous Every 24 hours 01/31/2019 0831 01/25/19 1047   02/06/2019 0400  ceFEPIme (MAXIPIME) 2 g in sodium chloride 0.9 % 100 mL IVPB     2 g 200 mL/hr over 30 Minutes Intravenous  Once 01/29/2019 0347 02/11/2019 0517   02/15/2019 0400  vancomycin (VANCOCIN) 1,250 mg in sodium chloride 0.9 % 250 mL IVPB     1,250 mg 166.7 mL/hr over 90 Minutes Intravenous  Once 01/26/2019 0353 01/29/2019 0817        Objective: Vitals:   01/25/19 0800 01/25/19 0900 01/25/19 1000 01/25/19 1100  BP: 126/60 (!) 117/58 (!) 150/85 115/61  Pulse: (!) 106 (!) 107 (!) 107 (!) 105  Resp: (!) 32 (!) 33 (!) 44 (!) 28  Temp: 98.1 F (36.7 C)  TempSrc: Axillary     SpO2: 100% 100% 100% 100%  Weight:      Height:        Intake/Output Summary (Last 24 hours) at 01/25/2019 1206 Last data filed at 01/25/2019 1100 Gross per 24 hour  Intake 1751.89 ml  Output -  Net 1751.89 ml   Filed Weights   02/02/2019 1345 01/25/19 0500  Weight: 62 kg 68.6 kg    Examination:  General exam: Appears calm and comfortable  Respiratory system: On NRB without distress  Cardiovascular system: S1 & S2 heard, tachycardic rate. No  JVD, murmurs, rubs, gallops or clicks. No pedal edema. Gastrointestinal system: Abdomen is nondistended, soft and nontender. No organomegaly or masses felt. Normal bowel sounds heard. Central nervous system: Alert and oriented Extremities: Symmetric  Skin: No rashes, lesions or ulcers Psychiatry: Judgement and insight appear stable  Data Reviewed: I have personally reviewed following labs and imaging studies  CBC: Recent Labs  Lab 02/02/2019 0300 01/25/19 0311  WBC 13.6* 9.4  NEUTROABS 12.5*  --   HGB 9.0* 8.6*  HCT 31.4* 29.8*  MCV 96.6 100.0  PLT 392 829   Basic Metabolic Panel: Recent Labs  Lab 02/08/2019 0300 01/25/19 0311  NA 140 144  K 4.1 3.8  CL 106 117*  CO2 24 21*  GLUCOSE 77 103*  BUN 31* 25*  CREATININE 1.54* 1.00  CALCIUM 8.1* 7.9*  MG  --  2.0  PHOS  --  3.3   GFR: Estimated Creatinine Clearance: 42.2 mL/min (by C-G formula based on SCr of 1 mg/dL). Liver Function Tests: Recent Labs  Lab 02/06/2019 0300  AST 39  ALT 20  ALKPHOS 122  BILITOT 0.8  PROT 6.6  ALBUMIN 2.1*   No results for input(s): LIPASE, AMYLASE in the last 168 hours. No results for input(s): AMMONIA in the last 168 hours. Coagulation Profile: Recent Labs  Lab 01/19/19 0545 02/03/2019 0744 01/25/19 0311  INR 5.8* 2.9* 3.8*   Cardiac Enzymes: No results for input(s): CKTOTAL, CKMB, CKMBINDEX, TROPONINI in the last 168 hours. BNP (last 3 results) No results for input(s): PROBNP in the last 8760 hours. HbA1C: No results for input(s): HGBA1C in the last 72 hours. CBG: Recent Labs  Lab 02/05/2019 1936 02/02/2019 2331 01/25/19 0307 01/25/19 0729 01/25/19 1145  GLUCAP 89 107* 98 80 80   Lipid Profile: No results for input(s): CHOL, HDL, LDLCALC, TRIG, CHOLHDL, LDLDIRECT in the last 72 hours. Thyroid Function Tests: No results for input(s): TSH, T4TOTAL, FREET4, T3FREE, THYROIDAB in the last 72 hours. Anemia Panel: No results for input(s): VITAMINB12, FOLATE, FERRITIN, TIBC,  IRON, RETICCTPCT in the last 72 hours. Sepsis Labs: Recent Labs  Lab 02/16/2019 0300 01/28/2019 0936  PROCALCITON 1.59  --   LATICACIDVEN 1.6 1.3    Recent Results (from the past 240 hour(s))  SARS Coronavirus 2 (CEPHEID - Performed in [facility] hospital lab), Hosp Order     Status: None   Collection Time: 01/16/19  3:22 PM  Result Value Ref Range Status   SARS Coronavirus 2 NEGATIVE NEGATIVE Final    Comment: (NOTE) If result is NEGATIVE SARS-CoV-2 target nucleic acids are NOT DETECTED. The SARS-CoV-2 RNA is generally detectable in upper and lower  respiratory specimens during the acute phase of infection. The lowest  concentration of SARS-CoV-2 viral copies this assay can detect is 250  copies / mL. A negative result does not preclude SARS-CoV-2 infection  and should not be used as the sole basis for treatment or  other  patient management decisions.  A negative result may occur with  improper specimen collection / handling, submission of specimen other  than nasopharyngeal swab, presence of viral mutation(s) within the  areas targeted by this assay, and inadequate number of viral copies  (<250 copies / mL). A negative result must be combined with clinical  observations, patient history, and epidemiological information. If result is POSITIVE SARS-CoV-2 target nucleic acids are DETECTED. The SARS-CoV-2 RNA is generally detectable in upper and lower  respiratory specimens dur ing the acute phase of infection.  Positive  results are indicative of active infection with SARS-CoV-2.  Clinical  correlation with patient history and other diagnostic information is  necessary to determine patient infection status.  Positive results do  not rule out bacterial infection or co-infection with other viruses. If result is PRESUMPTIVE POSTIVE SARS-CoV-2 nucleic acids MAY BE PRESENT.   A presumptive positive result was obtained on the submitted specimen  and confirmed on repeat testing.  While  2019 novel coronavirus  (SARS-CoV-2) nucleic acids may be present in the submitted sample  additional confirmatory testing may be necessary for epidemiological  and / or clinical management purposes  to differentiate between  SARS-CoV-2 and other Sarbecovirus currently known to infect humans.  If clinically indicated additional testing with an alternate test  methodology [phone]) is advised. The SARS-CoV-2 RNA is generally  detectable in upper and lower respiratory sp ecimens during the acute  phase of infection. The expected result is Negative. Fact Sheet for Patients:  StrictlyIdeas.no Fact Sheet for Healthcare Providers: BankingDealers.co.za This test is not yet approved or cleared by the [place] FDA and has been authorized for detection and/or diagnosis of SARS-CoV-2 by FDA under an Emergency Use Authorization (EUA).  This EUA will remain in effect (meaning this test can be used) for the duration of the COVID-19 declaration under Section 564(b)(1) of the Act, 21 U.S.C. section 360bbb-3(b)(1), unless the authorization is terminated or revoked sooner. Performed at [facility], [place] [address], [place], [place] [address]   Blood culture (routine x 2)     Status: None   Collection Time: 01/16/19  4:06 PM  Result Value Ref Range Status   Specimen Description   Final    BLOOD SITE NOT SPECIFIED Performed at [place] [address] [place], [place] [address]    Special Requests   Final    BOTTLES DRAWN AEROBIC AND ANAEROBIC Blood Culture adequate volume Performed at [place] [address], [place], [place] [address]    Culture   Final    NO GROWTH 5 DAYS Performed at [facility], [place] [address], [place], [place] [address]    Report Status 01/21/2019 FINAL  Final  Blood culture (routine x 2)     Status: None   Collection Time: 01/16/19  4:16 PM  Result  Value Ref Range Status   Specimen Description   Final    BLOOD SITE NOT SPECIFIED Performed at [place] [address] [place], [place] [address]    Special Requests   Final    BOTTLES DRAWN AEROBIC AND ANAEROBIC Blood Culture adequate volume Performed at [place] [address] [place], [place] [address]    Culture   Final    NO GROWTH 5 DAYS Performed at [facility], [place] [address] [place], [place] [address]    Report Status 01/21/2019 FINAL  Final  MRSA PCR Screening  Status: None   Collection Time: 01/17/19 12:37 PM  Result Value Ref Range Status   MRSA by PCR NEGATIVE NEGATIVE Final    Comment:        The GeneXpert MRSA Assay (FDA approved for NASAL specimens only), is one component of a comprehensive MRSA colonization surveillance program. It is not intended to diagnose MRSA infection nor to guide or monitor treatment for MRSA infections. Performed at [facility], [place] [address] [place], [place] [address]   Blood Culture (routine x 2)     Status: None (Preliminary result)   Collection Time: 02/07/2019  2:38 AM  Result Value Ref Range Status   Specimen Description   Final    BLOOD RIGHT HAND BLOOD Performed at [place] [address], [place], [place] [address]    Special Requests   Final    BOTTLES DRAWN AEROBIC AND ANAEROBIC Blood Culture results may not be optimal due to an excessive volume of blood received in culture bottles Performed at [place] [address] [place], [place] [address]    Culture   Final    NO GROWTH 1 DAY Performed at [facility], [place] [address] [place], [place] [address]    Report Status PENDING  Incomplete  SARS Coronavirus 2 (CEPHEID- Performed in [facility] hospital lab), Hosp Order     Status: None   Collection Time: 01/25/2019  2:45 AM  Result Value Ref Range Status   SARS Coronavirus 2 NEGATIVE  NEGATIVE Final    Comment: (NOTE) If result is NEGATIVE SARS-CoV-2 target nucleic acids are NOT DETECTED. The SARS-CoV-2 RNA is generally detectable in upper and lower  respiratory specimens during the acute phase of infection. The lowest  concentration of SARS-CoV-2 viral copies this assay can detect is 250  copies / mL. A negative result does not preclude SARS-CoV-2 infection  and should not be used as the sole basis for treatment or other  patient management decisions.  A negative result may occur with  improper specimen collection / handling, submission of specimen other  than nasopharyngeal swab, presence of viral mutation(s) within the  areas targeted by this assay, and inadequate number of viral copies  (<250 copies / mL). A negative result must be combined with clinical  observations, patient history, and epidemiological information. If result is POSITIVE SARS-CoV-2 target nucleic acids are DETECTED. The SARS-CoV-2 RNA is generally detectable in upper and lower  respiratory specimens dur ing the acute phase of infection.  Positive  results are indicative of active infection with SARS-CoV-2.  Clinical  correlation with patient history and other diagnostic information is  necessary to determine patient infection status.  Positive results do  not rule out bacterial infection or co-infection with other viruses. If result is PRESUMPTIVE POSTIVE SARS-CoV-2 nucleic acids MAY BE PRESENT.   A presumptive positive result was obtained on the submitted specimen  and confirmed on repeat testing.  While 2019 novel coronavirus  (SARS-CoV-2) nucleic acids may be present in the submitted sample  additional confirmatory testing may be necessary for epidemiological  and / or clinical management purposes  to differentiate between  SARS-CoV-2 and other Sarbecovirus currently known to infect humans.  If clinically indicated additional testing with an alternate test  methodology [phone]) is  advised. The SARS-CoV-2 RNA is generally  detectable in upper and lower respiratory sp ecimens during the acute  phase of infection. The expected result is Negative. Fact Sheet for Patients:  StrictlyIdeas.no  Fact Sheet for Healthcare Providers: BankingDealers.co.za This test is not yet approved or cleared by the [place] FDA and has been authorized for detection and/or diagnosis of SARS-CoV-2 by FDA under an Emergency Use Authorization (EUA).  This EUA will remain in effect (meaning this test can be used) for the duration of the COVID-19 declaration under Section 564(b)(1) of the Act, 21 U.S.C. section 360bbb-3(b)(1), unless the authorization is terminated or revoked sooner. Performed at [facility], [place] [address] [place], [place] [address]   Blood Culture (routine x 2)     Status: None (Preliminary result)   Collection Time: 02/04/2019  2:58 AM  Result Value Ref Range Status   Specimen Description   Final    BLOOD LEFT FOREARM Performed at [facility], [place] [address] [place], [place] [address]    Special Requests   Final    BOTTLES DRAWN AEROBIC AND ANAEROBIC Blood Culture adequate volume Performed at [place] [address] [place], [place] [address]    Culture   Final    NO GROWTH 1 DAY Performed at [facility], [place] [address], [place], [place] [address]    Report Status PENDING  Incomplete       Radiology Studies: Dg Chest Port 1 View  Result Date: 01/25/2019 CLINICAL DATA:  Respiratory failure EXAM: PORTABLE CHEST 1 VIEW COMPARISON:  02/09/2019 FINDINGS: Cardiac shadow is enlarged but stable. Postsurgical changes are again seen. Diffuse bilateral infiltrates are noted right greater than left stable from the previous exam. No new focal bony abnormality is noted. IMPRESSION: Stable bilateral infiltrates right greater than left. Electronically Signed   By: Inez Catalina M.D.   On: 01/25/2019 07:56   Dg Chest Port 1 View  Result Date: 02/01/2019 CLINICAL DATA:  Sepsis.  Shortness of breath. EXAM: PORTABLE CHEST 1 VIEW COMPARISON:  01/17/2019 FINDINGS: There are hazy bilateral airspace opacities, with some interval redistribution from prior study. The cardiac silhouette is enlarged. Patient is status post prior median sternotomy. A retrocardiac opacity is again noted. There is no large pneumothorax. Trace bilateral pleural effusions are suspected. IMPRESSION: Persistent multifocal airspace opacities concerning for multifocal pneumonia. Electronically Signed   By: Constance Holster M.D.   On: 02/20/2019 03:22      Scheduled Meds: . chlorhexidine  15 mL Mouth Rinse BID  . Chlorhexidine Gluconate Cloth  6 each Topical Daily  . mouth rinse  15 mL Mouth Rinse q12n4p   Continuous Infusions: . sodium chloride    . ceFEPime (MAXIPIME) IV    . dextrose 5 % and 0.9% NaCl 40 mL/hr at 01/25/19 1120     LOS: 1 day    Time spent: 45 minutes   Dessa Phi, DO [facility] [email or web address] 01/25/2019, 12:06 PM

## 2019-01-25 NOTE — Evaluation (Signed)
Clinical/Bedside Swallow Evaluation Patient Details  Name: Gary Valencia MRN: [id number] Date of Birth: [birth year]  Today's Date: 01/25/2019 Time: SLP Start Time (ACUTE ONLY): 7902 SLP Stop Time (ACUTE ONLY): 0905 SLP Time Calculation (min) (ACUTE ONLY): 30 min  Past Medical History:  Past Medical History:  Diagnosis Date  . AAA (abdominal aortic aneurysm) ([place])   . Actinic keratoses   . Anxiety   . ASHD (arteriosclerotic heart disease) 1984  . Atrial fibrillation ([place])   . Cancer ([place]) 1999   PROSTATE  . CVA (cerebral vascular accident) ([place])   . Diverticulosis   . Dyslipidemia   . Dysphagia    Moderate to severe on MBS 01/08/19  . Dysrhythmia   . Personal history of colonic polyps    Past Surgical History:  Past Surgical History:  Procedure Laterality Date  . ABDOMINAL AORTIC ENDOVASCULAR STENT GRAFT N/A 12/11/2015   Procedure: ABDOMINAL AORTIC ENDOVASCULAR STENT GRAFT;  Surgeon: Serafina Mitchell, MD;  Location: [facility];  Service: Vascular;  Laterality: N/A;  . CORONARY ARTERY BYPASS GRAFT  1984   [facility]- Dr. Redmond Pulling x4  . EYE SURGERY Bilateral    cataracts  . ODONTOID FRACTURE SURGERY     Odontoid screw fixation of type 2 odontoid fracture, open reduction and internal fixation of the fracture--patient had fallen down the stairs at 85 years old  . OTHER SURGICAL HISTORY  01/29/1998   Stage TIC adenocarcinoma of the prostate--Surgeon--David, Grapey M.D.--Interstital seed implantation with I-125 seeds--PSA of approximately 7  . PROSTATE SURGERY  approx 2003   [place] -Grapey  . TONSILLECTOMY     HPI:  Patient is a 85 y.o. male with PMH: possible recent CVA with residual dysarthria, prostate cancer, atrial fibrilation on anticoagulation with Coumadin, dyslipidemia, CAD s/p CABG, who was brought to ER acute respiratory failure, repeated hospital admissions with pna- last one a few weeks ago.  PSH + for type 2 odontoid fx, s/p screw fixation at 85 years of age after fall.  Pt CXR  01/17/2019 showed Bilateral patchy airspace disease representing multifocal pneumonia, not significantly different than prior date.  CXR during prior admit revealed left lower lobe infiltrate with small pleural effusion.  Swallow eval ordered.  Pt had been on dys3/nectar diet and was advanced to dys3/thin after MBS 01/10/2019.  Found to have suspected chronic dysphagia from his recent CVA, neck surgery, and age.  BSE ordered as pt continues with ongoing aspiration of even secretions.     Assessment / Plan / Recommendation Clinical Impression  Pt continues to demonstrate clinical indication of pharyngeal dysphagia- likely worsened from prior exam due this illness,deconditioning .   Secretion retention noted in posterior hypopharynx today upon inspection with flashlight.  Severe pharyngeal dysphagia with tsps water, tsps nectar, and ice chips multiple swallows, throat clearing after liquids and cough with expectoration of brown tinged secretions.  SLP role of aspiration mitigation only again reviewed.  Audible multiple swallows  with post-swallow coughing after thin. Concerns for recurrent pulmonary infections and possible eventual weight loss due to pt's current level of dysphagia is present.  Given his advanced age, dysphagia, 3rd admission with pna as well as pt reported lack of enjoyment with intake due to coughing, appreciate palliative input.    Spoke to pt regarding concept of comfort feeding and informed him that feeding tubes do not prevent aspiration.  Pt functitonally reports decreased discomfort and coughing at home with nectar liquids.  At this time with his elevated RR, HR and high need  for oxygen, recommend medicine with applesauce crushed, single ice chips and floor stock tsps nectar thick liquids at this time.  If pt is coughing, clearing his throat with po - cue to cough strongly and encourage him to expectorate.  Will follow up next date after care plan solidified.    SLP Visit Diagnosis:  Dysphagia, oropharyngeal phase (R13.12)    Aspiration Risk  Severe aspiration risk    Diet Recommendation Nectar-thick liquid;Other (Comment)   Medication Administration: Crushed with puree Compensations: Slow rate;Small sips/bites;Multiple dry swallows after each bite/sip;Other (Comment)(intermittent hock, clear throat strongly and reswallow if excessive clearing throat or coughing) Postural Changes: Seated upright at 90 degrees;Remain upright for at least 30 minutes after po intake    Other  Recommendations Oral Care Recommendations: Oral care BID Other Recommendations: Order thickener from pharmacy   Follow up Recommendations Other (comment)(tbd)      Frequency and Duration min 2x/week  1 week       Prognosis Prognosis for Safe Diet Advancement: Guarded Barriers to Reach Goals: Severity of deficits;Other (Comment);Time post onset Barriers/Prognosis Comment: age, level of dysphagia, recurrent pulmonary infections within 2 weeks, recent cva      Swallow Study   General Date of Onset: 01/17/19 HPI: Patient is a 85 y.o. male with PMH: possible recent CVA with residual dysarthria, prostate cancer, atrial fibrilation on anticoagulation with Coumadin, dyslipidemia, CAD s/p CABG, who was brought to ER acute respiratory failure, repeated hospital admissions with pna- last one a few weeks ago.  PSH + for type 2 odontoid fx, s/p screw fixation at 85 years of age after fall.  Pt CXR 01/17/2019 showed Bilateral patchy airspace disease representing multifocal pneumonia, not significantly different than prior date.  CXR during prior admit revealed left lower lobe infiltrate with small pleural effusion.  Swallow eval ordered.  Pt had been on dys3/nectar diet and was advanced to dys3/thin after MBS 01/10/2019.  Found to have suspected chronic dysphagia from his recent CVA, neck surgery, and age.  BSE ordered as pt continues with ongoing aspiration of even secretions.   Previous Swallow Assessment:  prior mbs, resdiuals across all consistencies, head turn nor chin tuck helpful, aspiration with thin and nectar, cleared with throat clear and reswallow, dry swallows helpful Temperature Spikes Noted: No Respiratory Status: Nasal cannula(HFNC, NRB) History of Recent Intubation: No Behavior/Cognition: Alert;Cooperative;Pleasant mood;Other (Comment)(pt repeated information re: chin tuck posture recommended by [facility] nurse repeatedly during BSE, ? some baseline cognitive deficits?) Oral Care Completed by SLP: No(pt was eating) Oral Cavity - Dentition: Other (Comment)(present) Vision: Functional for self-feeding Self-Feeding Abilities: Able to feed self Patient Positioning: Upright in bed Baseline Vocal Quality: Hoarse(mildly hoarse) Volitional Cough: Strong Volitional Swallow: (dnt, unable due to xerostomia)    Oral/Motor/Sensory Function Overall Oral Motor/Sensory Function: Generalized oral weakness(pt with increased dysarthria compared to prior admit)   Ice Chips Ice chips: Impaired Presentation: Spoon Pharyngeal Phase Impairments: Multiple swallows   Thin Liquid Thin Liquid: Impaired Presentation: Spoon Pharyngeal  Phase Impairments: Multiple swallows;Cough - Immediate;Other (comments)(cough and expectoration of viscous secretions )    Nectar Thick Nectar Thick Liquid: Impaired Pharyngeal Phase Impairments: Multiple swallows;Throat Clearing - Immediate   Honey Thick Honey Thick Liquid: Not tested   Puree Puree: Not tested   Solid     Solid: Not tested      Macario Golds 01/25/2019,10:49 AM  Luanna Salk, MS [place] Pager [phone] Office [phone]

## 2019-01-26 LAB — CBC
HCT: 33.8 % — ABNORMAL LOW (ref 39.0–52.0)
Hemoglobin: 9.5 g/dL — ABNORMAL LOW (ref 13.0–17.0)
MCH: 27.9 pg (ref 26.0–34.0)
MCHC: 28.1 g/dL — ABNORMAL LOW (ref 30.0–36.0)
MCV: 99.1 fL (ref 80.0–100.0)
Platelets: 341 10*3/uL (ref 150–400)
RBC: 3.41 MIL/uL — ABNORMAL LOW (ref 4.22–5.81)
RDW: 19.6 % — ABNORMAL HIGH (ref 11.5–15.5)
WBC: 11.9 10*3/uL — ABNORMAL HIGH (ref 4.0–10.5)
nRBC: 0 % (ref 0.0–0.2)

## 2019-01-26 LAB — BASIC METABOLIC PANEL
Anion gap: 8 (ref 5–15)
BUN: 24 mg/dL — ABNORMAL HIGH (ref 8–23)
CO2: 21 mmol/L — ABNORMAL LOW (ref 22–32)
Calcium: 8.4 mg/dL — ABNORMAL LOW (ref 8.9–10.3)
Chloride: 119 mmol/L — ABNORMAL HIGH (ref 98–111)
Creatinine, Ser: 1.02 mg/dL (ref 0.61–1.24)
GFR calc Af Amer: >60 mL/min (ref >60)
GFR calc non Af Amer: >60 mL/min (ref >60)
Glucose, Bld: 179 mg/dL — ABNORMAL HIGH (ref 70–99)
Potassium: 4.2 mmol/L (ref 3.5–5.1)
Sodium: 148 mmol/L — ABNORMAL HIGH (ref 135–145)

## 2019-01-26 LAB — GLUCOSE, CAPILLARY
Glucose-Capillary: 97 mg/dL (ref 70–99)
Glucose-Capillary: 105 mg/dL — ABNORMAL HIGH (ref 70–99)

## 2019-01-26 MED ORDER — GLYCOPYRROLATE 0.2 MG/ML IJ SOLN: 0.2000 mg | INTRAMUSCULAR | Status: DC | PRN

## 2019-01-26 MED ORDER — MORPHINE BOLUS VIA INFUSION
2.0000 mg | INTRAVENOUS | Status: DC | PRN
  Filled 2019-01-26: qty 4

## 2019-01-26 MED ORDER — BIOTENE DRY MOUTH MT LIQD: 15.0000 mL | OROMUCOSAL | Status: DC | PRN

## 2019-01-26 MED ORDER — LORAZEPAM 2 MG/ML IJ SOLN
0.2500 mg | Freq: Three times a day (TID) | INTRAMUSCULAR | Status: DC
  Administered 2019-01-26: 0.25 mg via INTRAVENOUS
  Filled 2019-01-26: qty 1

## 2019-01-26 MED ORDER — HALOPERIDOL LACTATE 2 MG/ML PO CONC
0.5000 mg | ORAL | Status: DC | PRN
  Filled 2019-01-26: qty 0.3

## 2019-01-26 MED ORDER — ONDANSETRON 4 MG PO TBDP: 4.0000 mg | ORAL_TABLET | Freq: Four times a day (QID) | ORAL | Status: DC | PRN

## 2019-01-26 MED ORDER — GLYCOPYRROLATE 1 MG PO TABS: 1.0000 mg | ORAL_TABLET | ORAL | Status: DC | PRN

## 2019-01-26 MED ORDER — ONDANSETRON HCL 4 MG/2ML IJ SOLN: 4.0000 mg | Freq: Four times a day (QID) | INTRAMUSCULAR | Status: DC | PRN

## 2019-01-26 MED ORDER — MORPHINE 100MG IN NS 100ML (1MG/ML) PREMIX INFUSION
2.0000 mg/h | INTRAVENOUS | Status: DC
  Administered 2019-01-26: 2 mg/h via INTRAVENOUS
  Filled 2019-01-26 (×2): qty 100

## 2019-01-26 MED ORDER — HALOPERIDOL 1 MG PO TABS: 0.5000 mg | ORAL_TABLET | ORAL | Status: DC | PRN

## 2019-01-26 MED ORDER — HALOPERIDOL LACTATE 5 MG/ML IJ SOLN: 0.5000 mg | INTRAMUSCULAR | Status: DC | PRN

## 2019-01-26 MED ORDER — POLYVINYL ALCOHOL 1.4 % OP SOLN
1.0000 [drp] | Freq: Four times a day (QID) | OPHTHALMIC | Status: DC | PRN
  Filled 2019-01-26: qty 15

## 2019-01-26 MED ORDER — MORPHINE SULFATE (PF) 2 MG/ML IV SOLN: 2.0000 mg | INTRAVENOUS | Status: DC | PRN

## 2019-01-26 NOTE — Progress Notes (Signed)
No charge note.  Paged by [facility] ICU RN.  Family is ready to shift to comfort measures.  Katelyn allowed me to speak with BG who is at bedside with patient's wife.   They understand that Faizaan will likely die today/tomorrow as we slowly pull back artificial support.  Orders adjusted for comfort.   Chaplain requested.  PMT will continue to follow.   Florentina Jenny, PA-C Palliative Medicine Pager: [phone]

## 2019-01-26 NOTE — Progress Notes (Signed)
   01/26/19 1200  Clinical Encounter Type  Visited With Patient and family together  Visit Type Initial;Psychological support;Spiritual support;Patient actively dying  Referral From Nurse  Consult/Referral To Chaplain  Spiritual Encounters  Spiritual Needs Emotional;Other (Comment) (Spiritual Care Conversation/Support)  Stress Factors  Patient Stress Factors None identified  Family Stress Factors Health changes;Major life changes;Loss   I visited with the patient and his family per referral from the nurse. The patient could understand that I was in the room and acknowledged me praying with them. The patient's wife of 23 years, son and daughter-in-law were present in the room at the time of my visit. They requested prayer.  I prayed at the bedside.   Please, contact Spiritual Care for further assistance.   Chaplain Shanon Ace M.Div., [facility]

## 2019-01-26 NOTE — Progress Notes (Signed)
Daily Progress Note   Patient Name: Gary Valencia       Date: 01/26/2019 DOB: Jan 11, 1923  Age: 85 y.o. MRN#: [id number] Attending Physician: Gary Phi, DO Primary Care Physician: Gary Lung, MD Admit Date: 01/23/2019  Reason for Consultation/Follow-up: Establishing goals of care and Terminal Care  Subjective: Visited patient.  Gave him a few sips of apple juice.  He is weaker today.  He has no complaints but he is more fatigued and I am less able to understand his speech.  He is under a Retail banker as his temp without it was 94.7.   I took off his non-rebreather for approx 1 min to give him a few sips of apple juice.  His sats quickly dropped from 95 to 89.  I added his son in by video.  Gary also felt his father is worse today than yesterday.  Kaitlyn RN and the Unit Nursing Director asked if family could come to bedside.  I am in agreement with them.  I discussed a shift to comfort with Gary (son).  Gary and his mother will consider it and likely move in that direction today.    We will continue full support until after the family visits as I feel he will decline rapidly and lose the ability to recognize his family if we lessen any support at this point.  Family will come to bedside.  Gary is conflicted about whether or not it is safe for the patient's 34 yo wife to visit in person.  Gary is aware that there is a maximum of 4 visitors allowed but he is not certain who will come yet.   The list of potential visitors are:  (of these only 4 will come).    Gary Valencia (son and HCPOA)   Gary Valencia (78 yo wife)   Gary Valencia    Assessment: Patient weaker today.  I'm concerned he may not recover and leave the hospital.   Patient Profile/HPI:   85  y.o. male  with past medical history of pharyngeal dysphagia, CVA, prostate CA, CAD s/p CABG, AAA, anxiety and afib who was admitted on 02/15/2019 with multifocal pneumonia and coagulopathy.   [name] has had two recent admissions 5/15 (anemia) and then 5/26 (hypoxia secondary to  aspiration).  Palliative care met with [name] during his most recent admission.  Length of Stay: 2  Current Medications: Scheduled Meds:  . chlorhexidine  15 mL Mouth Rinse BID  . Chlorhexidine Gluconate Cloth  6 each Topical Daily  . mouth rinse  15 mL Mouth Rinse q12n4p    Continuous Infusions: . sodium chloride    . ceFEPime (MAXIPIME) IV Stopped (01/26/19 0713)    PRN Meds: acetaminophen, ondansetron (ZOFRAN) IV  Physical Exam        Elderly male, awake, appears weak, under bair hugger on NRB at 15L 100% oxygen CV tachy resp no distress Abdomen soft, nt, nd  Vital Signs: BP (!) 146/66   Pulse (!) 102   Temp (!) 97.4 F (36.3 C) (Axillary)   Resp (!) 29   Ht '5\' 7"'  (1.702 m)   Wt 68.3 kg   SpO2 93%   BMI 23.58 kg/m  SpO2: SpO2: 93 % O2 Device: O2 Device: High Flow Nasal Cannula, NRB O2 Flow Rate: O2 Flow Rate (L/min): 15 L/min  Intake/output summary:   Intake/Output Summary (Last 24 hours) at 01/26/2019 0859 Last data filed at 01/26/2019 3845 Gross per 24 hour  Intake 963.3 ml  Output 50 ml  Net 913.3 ml   LBM: Last BM Date: (UTA) Baseline Weight: Weight: 62 kg Most recent weight: Weight: 68.3 kg       Palliative Assessment/Data: 20%    Flowsheet Rows     Most Recent Value  Intake Tab  Referral Department  Critical care  Unit at Time of Referral  ICU  Date Notified  01/25/2019  Palliative Care Type  Return patient Palliative Care  Reason for referral  Clarify Goals of Care, Psychosocial or Spiritual support  Date of Admission  01/29/2019  Date first seen by Palliative Care  01/29/2019  # of days Palliative referral response time  0 Day(s)  # of days IP prior to  Palliative referral  0  Clinical Assessment  Psychosocial & Spiritual Assessment  Palliative Care Outcomes      Patient Active Problem List   Diagnosis Date Noted  . Pharyngeal dysphagia   . Palliative care encounter   . Encounter for hospice care discussion   . Aspiration into airway   . Palliative care by specialist   . Goals of care, counseling/discussion   . SOB (shortness of breath)   . Acute respiratory failure with hypoxia ([place]) 01/16/2019  . HCAP (healthcare-associated pneumonia) 01/16/2019  . Pressure injury of skin 01/08/2019  . Severe anemia 01/05/2019  . Anemia 01/05/2019  . Herpes labialis 09/08/2017  . Personal history of colonic polyps   . Dysrhythmia   . Dyslipidemia   . Diverticulosis   . Atrial fibrillation ([place])   . Anxiety   . Actinic keratoses   . AAA (abdominal aortic aneurysm) without rupture ([place]) 12/11/2015  . CAD - S/P CABG in 1984 09/13/2012  . HTN (hypertension) 09/13/2012  . Chronic anticoagulation -Warfarin 09/13/2012  . Palpitations 09/13/2012  . Atrial fibrillation, permanent 10/11/2011  . ASHD (arteriosclerotic heart disease) 10/11/2011  . History of prostate cancer 10/11/2011  . AAA (4.09 cm on last [place] -2013) 10/11/2011  . Hyperlipidemia 10/11/2011  . Cancer [facility]) 08/23/1997    Palliative Care Plan    Recommendations/Plan:  Patient declining.  Family invited in.  I'm grateful to [facility] and the RN staff in ICU.  Will likely shift to comfort after family visits (to be decided during visit)  PMT will continue to  follow and support  Goals of Care and Additional Recommendations:  Limitations on Scope of Treatment: Full Scope Treatment  Code Status:  DNR  Prognosis:   Hours - Days   Discharge Planning:  Anticipated Hospital Death  Care plan was discussed with TRH MD, [facility] ICU RN, Family.  Thank you for allowing the Palliative Medicine Team to assist in the care of this patient.  Total time spent:  35 min.      Greater than 50%  of this time was spent counseling and coordinating care related to the above assessment and plan.  Florentina Jenny, PA-C Palliative Medicine  Please contact Palliative MedicineTeam phone at [phone] for questions and concerns between 7 am - 7 pm.   Please see AMION for individual provider pager numbers.

## 2019-01-26 NOTE — Progress Notes (Signed)
Patient's son and wife educated on progress of comfort measures. Morphine started. Patient comfortable and sleeping at present time. Will continue to monitor patient and provide comfort.

## 2019-01-26 NOTE — Progress Notes (Signed)
PROGRESS NOTE    MICK TANGUMA  WPV:[id number] DOB: 05-07-1923 DOA: 01/22/2019 PCP: [name], MD     Brief Narrative:  DAELIN HASTE is a 85 year old male with past medical history of atrial fibrillation on Coumadin, AAA, history of prostate cancer, CAD status post CABG, hyperlipidemia who presented to the hospital on 02/08/2019 with reports of increased shortness of breath and decreased oxygen saturation at home.  He has had 2 recent hospitalization with most recent admit from 5/26 to 5/29 with shortness of breath.  At that time, symptoms were thought to be in setting of recurrent aspiration with concomitant CHF.  He was given IV antibiotics and diuretics at that time.  On 5/18, he had MBS that was consistent with moderate to severe pharyngeal dysphagia.  In the emergency room, his x-ray revealed bilateral airspace opacities.  He was admitted to [facility] service for recurrent aspiration pneumonia with hypotension.  His blood pressure eventually stabilized and patient was transferred to [facility] service on 6/4.   New events last 24 hours / Subjective: Patient wearing nonrebreather mask satting 90s. Appears very weak. No complaints, although it is difficult to understand his mumbling.   Assessment & Plan:   Active Problems:   HCAP (healthcare-associated pneumonia)   Pharyngeal dysphagia   Palliative care encounter   Encounter for hospice care discussion   Acute hypoxemic respiratory failure -Continue to wean oxygen as able, currently requiring NRB   Sepsis secondary to recurrent aspiration pneumonia -Sepsis present on admission, WBC normal now  -MBS in May 2020 revealed moderate to severe dysphasia, recommended for dysphagia 3 diet -Blood cultures negative to date -SARS-CoV-2 negative -Repeat CXR showed stable bilateral infiltrates R>L  -Cefepime   AKI -Creatinine back to baseline  Hypernatremia -Stop IVF and trend BMP   Chronic diastolic heart failure -Stable   Paroxysmal  atrial fibrillation -Not currently on anticoagulation due to GI bleed -Elevated INR 3.8.  Unclear if patient has been receiving Coumadin at home    DVT prophylaxis: SCD Code Status: DNR Family Communication: None Disposition Plan: Discussed with palliative car medicine. Family coming today to bedside, possibly transition to full comfort    Consultants:   PCCM  Palliative care medicine  Procedures:   None  Antimicrobials:  Anti-infectives (From admission, onward)   Start     Dose/Rate Route Frequency Ordered Stop   01/25/19 1800  ceFEPIme (MAXIPIME) 2 g in sodium chloride 0.9 % 100 mL IVPB     2 g 200 mL/hr over 30 Minutes Intravenous Every 12 hours 01/25/19 1047     01/25/19 0400  ceFEPIme (MAXIPIME) 2 g in sodium chloride 0.9 % 100 mL IVPB  Status:  Discontinued     2 g 200 mL/hr over 30 Minutes Intravenous Every 24 hours 02/02/2019 0831 01/25/19 1047   02/16/2019 0400  ceFEPIme (MAXIPIME) 2 g in sodium chloride 0.9 % 100 mL IVPB     2 g 200 mL/hr over 30 Minutes Intravenous  Once 02/18/2019 0347 01/25/2019 0517   02/07/2019 0400  vancomycin (VANCOCIN) 1,250 mg in sodium chloride 0.9 % 250 mL IVPB     1,250 mg 166.7 mL/hr over 90 Minutes Intravenous  Once 02/15/2019 0353 02/16/2019 0817       Objective: Vitals:   01/26/19 0600 01/26/19 0605 01/26/19 0700 01/26/19 0800  BP: (!) 151/81  139/81 (!) 146/66  Pulse: (!) 105 (!) 112 (!) 102 (!) 102  Resp: (!) 30 18 (!) 31 (!) 29  Temp:  TempSrc:      SpO2: 92% 91% 95% 93%  Weight:      Height:        Intake/Output Summary (Last 24 hours) at 01/26/2019 0940 Last data filed at 01/26/2019 1610 Gross per 24 hour  Intake 923.3 ml  Output 50 ml  Net 873.3 ml   Filed Weights   02/12/2019 1345 01/25/19 0500 01/26/19 0500  Weight: 62 kg 68.6 kg 68.3 kg    Examination: General exam: Appears calm and comfortable under bearhugger  Respiratory system: Diminished  Cardiovascular system: S1 & S2 heard, tachycardic regular rhythm   Gastrointestinal system: Abdomen is nondistended, soft and nontender. No organomegaly or masses felt. Normal bowel sounds heard. Central nervous system: Alert, nonfocal exam  Extremities: Symmetric  Skin: No rashes, lesions or ulcers on exposed skin Psychiatry: Stable  Data Reviewed: I have personally reviewed following labs and imaging studies  CBC: Recent Labs  Lab 02/12/2019 0300 01/25/19 0311 01/26/19 0305  WBC 13.6* 9.4 11.9*  NEUTROABS 12.5*  --   --   HGB 9.0* 8.6* 9.5*  HCT 31.4* 29.8* 33.8*  MCV 96.6 100.0 99.1  PLT 392 362 960   Basic Metabolic Panel: Recent Labs  Lab 01/23/2019 0300 01/25/19 0311 01/26/19 0305  NA 140 144 148*  K 4.1 3.8 4.2  CL 106 117* 119*  CO2 24 21* 21*  GLUCOSE 77 103* 179*  BUN 31* 25* 24*  CREATININE 1.54* 1.00 1.02  CALCIUM 8.1* 7.9* 8.4*  MG  --  2.0  --   PHOS  --  3.3  --    GFR: Estimated Creatinine Clearance: 41.4 mL/min (by C-G formula based on SCr of 1.02 mg/dL). Liver Function Tests: Recent Labs  Lab 02/04/2019 0300  AST 39  ALT 20  ALKPHOS 122  BILITOT 0.8  PROT 6.6  ALBUMIN 2.1*   No results for input(s): LIPASE, AMYLASE in the last 168 hours. No results for input(s): AMMONIA in the last 168 hours. Coagulation Profile: Recent Labs  Lab 02/08/2019 0744 01/25/19 0311  INR 2.9* 3.8*   Cardiac Enzymes: No results for input(s): CKTOTAL, CKMB, CKMBINDEX, TROPONINI in the last 168 hours. BNP (last 3 results) No results for input(s): PROBNP in the last 8760 hours. HbA1C: No results for input(s): HGBA1C in the last 72 hours. CBG: Recent Labs  Lab 01/25/19 1610 01/25/19 1939 01/25/19 2340 01/26/19 0314 01/26/19 0752  GLUCAP 82 86 102* 97 105*   Lipid Profile: No results for input(s): CHOL, HDL, LDLCALC, TRIG, CHOLHDL, LDLDIRECT in the last 72 hours. Thyroid Function Tests: No results for input(s): TSH, T4TOTAL, FREET4, T3FREE, THYROIDAB in the last 72 hours. Anemia Panel: No results for input(s):  VITAMINB12, FOLATE, FERRITIN, TIBC, IRON, RETICCTPCT in the last 72 hours. Sepsis Labs: Recent Labs  Lab 02/13/2019 0300 02/05/2019 0936  PROCALCITON 1.59  --   LATICACIDVEN 1.6 1.3    Recent Results (from the past 240 hour(s))  SARS Coronavirus 2 (CEPHEID - Performed in [facility] hospital lab), Hosp Order     Status: None   Collection Time: 01/16/19  3:22 PM  Result Value Ref Range Status   SARS Coronavirus 2 NEGATIVE NEGATIVE Final    Comment: (NOTE) If result is NEGATIVE SARS-CoV-2 target nucleic acids are NOT DETECTED. The SARS-CoV-2 RNA is generally detectable in upper and lower  respiratory specimens during the acute phase of infection. The lowest  concentration of SARS-CoV-2 viral copies this assay can detect is 250  copies / mL. A negative result  does not preclude SARS-CoV-2 infection  and should not be used as the sole basis for treatment or other  patient management decisions.  A negative result may occur with  improper specimen collection / handling, submission of specimen other  than nasopharyngeal swab, presence of viral mutation(s) within the  areas targeted by this assay, and inadequate number of viral copies  (<250 copies / mL). A negative result must be combined with clinical  observations, patient history, and epidemiological information. If result is POSITIVE SARS-CoV-2 target nucleic acids are DETECTED. The SARS-CoV-2 RNA is generally detectable in upper and lower  respiratory specimens dur ing the acute phase of infection.  Positive  results are indicative of active infection with SARS-CoV-2.  Clinical  correlation with patient history and other diagnostic information is  necessary to determine patient infection status.  Positive results do  not rule out bacterial infection or co-infection with other viruses. If result is PRESUMPTIVE POSTIVE SARS-CoV-2 nucleic acids MAY BE PRESENT.   A presumptive positive result was obtained on the submitted specimen  and  confirmed on repeat testing.  While 2019 novel coronavirus  (SARS-CoV-2) nucleic acids may be present in the submitted sample  additional confirmatory testing may be necessary for epidemiological  and / or clinical management purposes  to differentiate between  SARS-CoV-2 and other Sarbecovirus currently known to infect humans.  If clinically indicated additional testing with an alternate test  methodology [phone]) is advised. The SARS-CoV-2 RNA is generally  detectable in upper and lower respiratory sp ecimens during the acute  phase of infection. The expected result is Negative. Fact Sheet for Patients:  StrictlyIdeas.no Fact Sheet for Healthcare Providers: BankingDealers.co.za This test is not yet approved or cleared by the [place] FDA and has been authorized for detection and/or diagnosis of SARS-CoV-2 by FDA under an Emergency Use Authorization (EUA).  This EUA will remain in effect (meaning this test can be used) for the duration of the COVID-19 declaration under Section 564(b)(1) of the Act, 21 U.S.C. section 360bbb-3(b)(1), unless the authorization is terminated or revoked sooner. Performed at [facility], [place] [address], [place], [place] [address]   Blood culture (routine x 2)     Status: None   Collection Time: 01/16/19  4:06 PM  Result Value Ref Range Status   Specimen Description   Final    BLOOD SITE NOT SPECIFIED Performed at [place] [address], [place], [place] [address]    Special Requests   Final    BOTTLES DRAWN AEROBIC AND ANAEROBIC Blood Culture adequate volume Performed at [place] [address] [place], [place] [address]    Culture   Final    NO GROWTH 5 DAYS Performed at [facility], [place] [address] [place], [place] [address]    Report Status 01/21/2019 FINAL  Final  Blood culture (routine x 2)     Status: None    Collection Time: 01/16/19  4:16 PM  Result Value Ref Range Status   Specimen Description   Final    BLOOD SITE NOT SPECIFIED Performed at [place] [address] [place], [place] [address]    Special Requests   Final    BOTTLES DRAWN AEROBIC AND ANAEROBIC Blood Culture adequate volume Performed at [place] [address] [place], [place] [address]    Culture   Final    NO GROWTH 5 DAYS Performed at [facility], [place]  [address] [place], [place] [address]    Report Status 01/21/2019 FINAL  Final  MRSA PCR Screening     Status: None   Collection Time: 01/17/19 12:37 PM  Result Value Ref Range Status   MRSA by PCR NEGATIVE NEGATIVE Final    Comment:        The GeneXpert MRSA Assay (FDA approved for NASAL specimens only), is one component of a comprehensive MRSA colonization surveillance program. It is not intended to diagnose MRSA infection nor to guide or monitor treatment for MRSA infections. Performed at [facility], [place] [address] [place], [place] [address]   Blood Culture (routine x 2)     Status: None (Preliminary result)   Collection Time: 01/23/2019  2:38 AM  Result Value Ref Range Status   Specimen Description   Final    BLOOD RIGHT HAND BLOOD Performed at [place] [address] [place], [place] [address]    Special Requests   Final    BOTTLES DRAWN AEROBIC AND ANAEROBIC Blood Culture results may not be optimal due to an excessive volume of blood received in culture bottles Performed at [place] [address] [place], [place] [address]    Culture   Final    NO GROWTH 1 DAY Performed at [facility], [place] [address] [place], [place] [address]    Report Status PENDING  Incomplete  SARS Coronavirus 2 (CEPHEID- Performed in [place] hospital lab), Hosp Order     Status: None   Collection Time: 01/26/2019  2:45 AM  Result Value Ref  Range Status   SARS Coronavirus 2 NEGATIVE NEGATIVE Final    Comment: (NOTE) If result is NEGATIVE SARS-CoV-2 target nucleic acids are NOT DETECTED. The SARS-CoV-2 RNA is generally detectable in upper and lower  respiratory specimens during the acute phase of infection. The lowest  concentration of SARS-CoV-2 viral copies this assay can detect is 250  copies / mL. A negative result does not preclude SARS-CoV-2 infection  and should not be used as the sole basis for treatment or other  patient management decisions.  A negative result may occur with  improper specimen collection / handling, submission of specimen other  than nasopharyngeal swab, presence of viral mutation(s) within the  areas targeted by this assay, and inadequate number of viral copies  (<250 copies / mL). A negative result must be combined with clinical  observations, patient history, and epidemiological information. If result is POSITIVE SARS-CoV-2 target nucleic acids are DETECTED. The SARS-CoV-2 RNA is generally detectable in upper and lower  respiratory specimens dur ing the acute phase of infection.  Positive  results are indicative of active infection with SARS-CoV-2.  Clinical  correlation with patient history and other diagnostic information is  necessary to determine patient infection status.  Positive results do  not rule out bacterial infection or co-infection with other viruses. If result is PRESUMPTIVE POSTIVE SARS-CoV-2 nucleic acids MAY BE PRESENT.   A presumptive positive result was obtained on the submitted specimen  and confirmed on repeat testing.  While 2019 novel coronavirus  (SARS-CoV-2) nucleic acids may be present in the submitted sample  additional confirmatory testing may be necessary for epidemiological  and / or clinical management purposes  to differentiate between  SARS-CoV-2 and other Sarbecovirus currently known to infect humans.  If clinically indicated additional testing with an  alternate test  methodology [phone]) is advised. The SARS-CoV-2 RNA is generally  detectable in upper and  lower respiratory sp ecimens during the acute  phase of infection. The expected result is Negative. Fact Sheet for Patients:  StrictlyIdeas.no Fact Sheet for Healthcare Providers: BankingDealers.co.za This test is not yet approved or cleared by the [place] FDA and has been authorized for detection and/or diagnosis of SARS-CoV-2 by FDA under an Emergency Use Authorization (EUA).  This EUA will remain in effect (meaning this test can be used) for the duration of the COVID-19 declaration under Section 564(b)(1) of the Act, 21 U.S.C. section 360bbb-3(b)(1), unless the authorization is terminated or revoked sooner. Performed at [facility], [place] [address], [place], [place] [address]   Blood Culture (routine x 2)     Status: None (Preliminary result)   Collection Time: 02/09/2019  2:58 AM  Result Value Ref Range Status   Specimen Description   Final    BLOOD LEFT FOREARM Performed at [facility], [place] [address] [place], [place] [address]    Special Requests   Final    BOTTLES DRAWN AEROBIC AND ANAEROBIC Blood Culture adequate volume Performed at [place] [address], [place], [place] [address]    Culture   Final    NO GROWTH 1 DAY Performed at [facility], [place] [address], [place], [place] [address]    Report Status PENDING  Incomplete       Radiology Studies: Dg Chest Port 1 View  Result Date: 01/25/2019 CLINICAL DATA:  Respiratory failure EXAM: PORTABLE CHEST 1 VIEW COMPARISON:  01/25/2019 FINDINGS: Cardiac shadow is enlarged but stable. Postsurgical changes are again seen. Diffuse bilateral infiltrates are noted right greater than left stable from the previous exam. No new focal bony abnormality is noted. IMPRESSION: Stable bilateral infiltrates right greater than  left. Electronically Signed   By: Inez Catalina M.D.   On: 01/25/2019 07:56      Scheduled Meds: . chlorhexidine  15 mL Mouth Rinse BID  . Chlorhexidine Gluconate Cloth  6 each Topical Daily  . mouth rinse  15 mL Mouth Rinse q12n4p   Continuous Infusions: . sodium chloride    . ceFEPime (MAXIPIME) IV Stopped (01/26/19 0713)     LOS: 2 days    Time spent: 35 minutes   Dessa Phi, DO [facility] [email or web address] 01/26/2019, 9:40 AM

## 2019-01-29 ENCOUNTER — Telehealth: Payer: Self-pay | Admitting: Family Medicine

## 2019-01-29 LAB — CULTURE, BLOOD (ROUTINE X 2)
Culture: NO GROWTH
Culture: NO GROWTH
Special Requests: ADEQUATE

## 2019-01-29 NOTE — Telephone Encounter (Signed)
Sympathy card sent 

## 2019-02-07 ENCOUNTER — Ambulatory Visit: Payer: Medicare Other | Admitting: Adult Health

## 2019-02-21 NOTE — Death Summary Note (Signed)
DEATH SUMMARY   Patient Details  Name: Gary Valencia MRN: [id number] DOB: 26-Jul-1923  Admission/Discharge Information   Admit Date:  2019/01/30  Date of Death: Date of Death: 02/02/19  Time of Death: Time of Death: 11/21/2043  Length of Stay: 3  Referring Physician: [name], MD   Reason(s) for Hospitalization  Respiratory failure  Diagnoses  Preliminary cause of death:  Acute hypoxemic respiratory failure  Secondary Diagnoses (including complications and co-morbidities):  Active Problems:   HCAP (healthcare-associated pneumonia)   Pharyngeal dysphagia   Palliative care encounter   Encounter for hospice care discussion Sepsis secondary to recurrent aspiration pneumonia AKI Hyponatremia Chronic diastolic heart failure Paroxysmal atrial fibrillation  Brief Hospital Course (including significant findings, care, treatment, and services provided and events leading to death)  Gary Valencia is a 85 y.o. year old male with past medical history of atrial fibrillation on Coumadin, AAA, history of prostate cancer, CAD status post CABG, hyperlipidemia who presented to the hospital on 01/30/19 with reports of increased shortness of breath and decreased oxygen saturation at home.  He has had 2 recent hospitalization with most recent admit from 5/26 to 5/29 with shortness of breath.  At that time, symptoms were thought to be in setting of recurrent aspiration with concomitant CHF.  He was given IV antibiotics and diuretics at that time.  On 5/18, he had MBS that was consistent with moderate to severe pharyngeal dysphagia.  In the emergency room, his x-ray revealed bilateral airspace opacities.  He was admitted to [facility] service for recurrent aspiration pneumonia with hypotension.  His blood pressure eventually stabilized and patient was transferred to [facility] service on 6/4.  He continued to require high levels of oxygenation support including nonrebreather mask and high flow nasal cannula oxygen.   Due to his recurrent aspiration pneumonia, dysphasia as well as respiratory distress, palliative care was consulted.  Ultimately, family decided to proceed with comfort measures.  Pertinent Labs and Studies  Significant Diagnostic Studies X-ray Chest Pa And Lateral  Result Date: 01/17/2019 CLINICAL DATA:  SOB EXAM: CHEST - 2 VIEW COMPARISON:  01/16/19, CT 10/07/2015 FINDINGS: Sternal wires overlie normal cardiac silhouette. Scattered bilateral fine airspace disease similar prior. Low lung volumes. No pneumothorax. Lateral projection demonstrates chronic compression fracture at the thoracolumbar junction with kyphosis. Abdominal stent graft noted IMPRESSION: Bilateral patchy airspace disease representing multifocal pneumonia versus pulmonary edema. No significant change from 1 day prior. Electronically Signed   By: Suzy Bouchard M.D.   On: 01/17/2019 09:21   Ct Head Wo Contrast  Result Date: 01/05/2019 CLINICAL DATA:  Altered level of consciousness. EXAM: CT HEAD WITHOUT CONTRAST TECHNIQUE: Contiguous axial images were obtained from the base of the skull through the vertex without intravenous contrast. COMPARISON:  CT head 10/18/2009 FINDINGS: Brain: Moderate atrophy with mild progression. Chronic microvascular ischemic changes in the white matter. Small chronic infarct bilateral parietal lobe unchanged. No acute infarct, hemorrhage, mass. Vascular: Negative for hyperdense vessel. Atherosclerotic calcification in the cavernous carotid bilaterally. Skull: Negative Sinuses/Orbits: Negative Other: None IMPRESSION: Moderate atrophy and chronic ischemic changes. No acute abnormality. Electronically Signed   By: Franchot Gallo M.D.   On: 01/05/2019 13:13   Dg Chest Port 1 View  Result Date: 01/25/2019 CLINICAL DATA:  Respiratory failure EXAM: PORTABLE CHEST 1 VIEW COMPARISON:  2019/01/30 FINDINGS: Cardiac shadow is enlarged but stable. Postsurgical changes are again seen. Diffuse bilateral infiltrates are  noted right greater than left stable from the previous exam. No new focal bony  abnormality is noted. IMPRESSION: Stable bilateral infiltrates right greater than left. Electronically Signed   By: Inez Catalina M.D.   On: 01/25/2019 07:56   Dg Chest Port 1 View  Result Date: 02/18/2019 CLINICAL DATA:  Sepsis.  Shortness of breath. EXAM: PORTABLE CHEST 1 VIEW COMPARISON:  01/17/2019 FINDINGS: There are hazy bilateral airspace opacities, with some interval redistribution from prior study. The cardiac silhouette is enlarged. Patient is status post prior median sternotomy. A retrocardiac opacity is again noted. There is no large pneumothorax. Trace bilateral pleural effusions are suspected. IMPRESSION: Persistent multifocal airspace opacities concerning for multifocal pneumonia. Electronically Signed   By: Constance Holster M.D.   On: 01/23/2019 03:22   Dg Chest Port 1 View  Result Date: 01/16/2019 CLINICAL DATA:  Low oxygen saturation.  Shortness of breath. EXAM: PORTABLE CHEST 1 VIEW COMPARISON:  01/05/2019.  09/02/2017. FINDINGS: Previous median sternotomy. The patient has a background pattern mild chronic pulmonary scarring at the bases. There is diffuse pulmonary hazy opacity which has worsened further since the study of 11 days ago, consistent with pneumonia. There is some volume loss and consolidation in the left lower lobe. There may be some pleural fluid on left. Findings are presumed secondary to pneumonia, though there could be an element of fluid overload/edema. IMPRESSION: Continued worsening of diffuse pulmonary density. Left lower lobe density and probable left effusion. Findings could relate to a combination of pneumonia and heart failure. Electronically Signed   By: Nelson Chimes M.D.   On: 01/16/2019 15:49   Dg Chest Port 1 View  Result Date: 01/05/2019 CLINICAL DATA:  Shortness of breath and difficulty speaking EXAM: PORTABLE CHEST 1 VIEW COMPARISON:  09/02/2017 FINDINGS: Cardiac shadow is  mildly prominent but accentuated by the portable technique. Postsurgical changes are seen. Patchy infiltrative changes are noted in both lungs. Small left effusion is noted as well. No acute bony abnormality is noted. IMPRESSION: Patchy infiltrates worst in the left base with small effusion. Electronically Signed   By: Inez Catalina M.D.   On: 01/05/2019 13:14   Dg Swallowing Func-speech Pathology  Result Date: 01/08/2019 Objective Swallowing Evaluation: Type of Study: MBS-Modified Barium Swallow Study  Patient Details Name: Gary Valencia MRN: [id number] Date of Birth: February 17, 1923 Today's Date: 01/08/2019 Time: SLP Start Time (ACUTE ONLY): 0915 -SLP Stop Time (ACUTE ONLY): 0935 SLP Time Calculation (min) (ACUTE ONLY): 20 min Past Medical History: Past Medical History: Diagnosis Date . AAA (abdominal aortic aneurysm) ([place])  . Actinic keratoses  . Anxiety  . ASHD (arteriosclerotic heart disease) 1984 . Atrial fibrillation ([place])  . Cancer ([place]) 1999  PROSTATE . CVA (cerebral vascular accident) ([place])  . Diverticulosis  . Dyslipidemia  . Dysrhythmia  . Personal history of colonic polyps  Past Surgical History: Past Surgical History: Procedure Laterality Date . ABDOMINAL AORTIC ENDOVASCULAR STENT GRAFT N/A 12/11/2015  Procedure: ABDOMINAL AORTIC ENDOVASCULAR STENT GRAFT;  Surgeon: Serafina Mitchell, MD;  Location: [facility];  Service: Vascular;  Laterality: N/A; . CORONARY ARTERY BYPASS GRAFT  1984  [facility]- Dr. Redmond Pulling x4 . EYE SURGERY Bilateral   cataracts . ODONTOID FRACTURE SURGERY    Odontoid screw fixation of type 2 odontoid fracture, open reduction and internal fixation of the fracture--patient had fallen down the stairs at 85 years old . OTHER SURGICAL HISTORY  01/29/1998  Stage TIC adenocarcinoma of the prostate--Surgeon--David, Grapey M.D.--Interstital seed implantation with I-125 seeds--PSA of approximately 7 . PROSTATE SURGERY  approx 2003  [place] -Grapey . TONSILLECTOMY   HPI:  Patient is a 85 y.o. male with PMH:  recent CVA with residual dysarthria, prostate cancer, atrial fibrilation on anticoagulation with Coumadin, dyslipidemia, CAD s/p CABG, who was brought to ER with generalized weakness, several days of black-colored stool. CXR revealed left lower lobe infiltrate with small pleural effusion; GI was consulted and he was started on PPI and clear liquids. Patient was observed to cough with clear liquids and was made NPO awaiting swallow evaluation.  Subjective: pleasant, sitting in chair in radiology suite Assessment / Plan / Recommendation CHL IP CLINICAL IMPRESSIONS 01/08/2019 Clinical Impression Patient presents with a mod-severe pharyngeal dysphagia which is likely chronic with contributing factors of h/o CVA, advanced age, h/o neck surgery. Patient exhibited delays in swallow initiation to level of vallecular sinus with all consistencies; residuals in pharynx post initial swallows at level of vallecular sinus, pyriform sinus, lateral channel and posterior pharyngeal wall with all consistencies. Penetration occured during the swallow with thin liquids and with aspiration occuring after swallow, but patient was able to clear deep penetrate and aspirate with throat clear and reswallow. Amount of penetrate and/or aspirate was trace-minimal but was fairly consistent in occurance. Chin tuck posture and head turn postures were not beneficial in preventing or reducing penetration/aspiration and did not help in clearing pharyngeal residuals. Patient is fully aware and cogntively intake and able to perform strategy of throat clear and reswallow to clear laryngeal vestibule. He will remain at a high risk of aspiration, however improvement of dysphagia is not likely as it appears to be chronic in nature. SLP Visit Diagnosis Dysphagia, pharyngeal phase (R13.13) Attention and concentration deficit following -- Frontal lobe and executive function deficit following -- Impact on safety and function Moderate aspiration risk   CHL IP  TREATMENT RECOMMENDATION 01/08/2019 Treatment Recommendations Therapy as outlined in treatment plan below   Prognosis 01/08/2019 Prognosis for Safe Diet Advancement Fair Barriers to Reach Goals -- Barriers/Prognosis Comment -- CHL IP DIET RECOMMENDATION 01/08/2019 SLP Diet Recommendations Thin liquid;Dysphagia 3 (Mech soft) solids Liquid Administration via Cup;No straw Medication Administration Crushed with puree Compensations Slow rate;Small sips/bites Postural Changes Remain semi-upright after after feeds/meals (Comment);Seated upright at 90 degrees   CHL IP OTHER RECOMMENDATIONS 01/08/2019 Recommended Consults -- Oral Care Recommendations Oral care BID;Patient independent with oral care Other Recommendations --   CHL IP FOLLOW UP RECOMMENDATIONS 01/08/2019 Follow up Recommendations Home health SLP;Other (comment)   CHL IP FREQUENCY AND DURATION 01/08/2019 Speech Therapy Frequency (ACUTE ONLY) min 2x/week Treatment Duration 1 week      CHL IP ORAL PHASE 01/08/2019 Oral Phase WFL Oral - Pudding Teaspoon -- Oral - Pudding Cup -- Oral - Honey Teaspoon -- Oral - Honey Cup -- Oral - Nectar Teaspoon -- Oral - Nectar Cup -- Oral - Nectar Straw -- Oral - Thin Teaspoon -- Oral - Thin Cup -- Oral - Thin Straw -- Oral - Puree -- Oral - Mech Soft -- Oral - Regular -- Oral - Multi-Consistency -- Oral - Pill -- Oral Phase - Comment --  CHL IP PHARYNGEAL PHASE 01/08/2019 Pharyngeal Phase Impaired Pharyngeal- Pudding Teaspoon -- Pharyngeal -- Pharyngeal- Pudding Cup -- Pharyngeal -- Pharyngeal- Honey Teaspoon -- Pharyngeal -- Pharyngeal- Honey Cup -- Pharyngeal -- Pharyngeal- Nectar Teaspoon -- Pharyngeal -- Pharyngeal- Nectar Cup Delayed swallow initiation-vallecula;Pharyngeal residue - valleculae;Pharyngeal residue - pyriform;Pharyngeal residue - posterior pharnyx;Lateral channel residue Pharyngeal -- Pharyngeal- Nectar Straw -- Pharyngeal -- Pharyngeal- Thin Teaspoon -- Pharyngeal -- Pharyngeal- Thin Cup Delayed swallow  initiation-vallecula;Pharyngeal residue - valleculae;Pharyngeal residue - pyriform;Lateral channel  residue;Penetration/Aspiration during swallow;Trace aspiration Pharyngeal Material enters airway, CONTACTS cords and then ejected out;Material enters airway, passes BELOW cords then ejected out Pharyngeal- Thin Straw -- Pharyngeal -- Pharyngeal- Puree Delayed swallow initiation-vallecula;Pharyngeal residue - pyriform;Pharyngeal residue - valleculae;Pharyngeal residue - posterior pharnyx Pharyngeal -- Pharyngeal- Mechanical Soft -- Pharyngeal -- Pharyngeal- Regular Delayed swallow initiation-vallecula;Pharyngeal residue - pyriform;Pharyngeal residue - valleculae;Pharyngeal residue - posterior pharnyx Pharyngeal -- Pharyngeal- Multi-consistency -- Pharyngeal -- Pharyngeal- Pill -- Pharyngeal -- Pharyngeal Comment --  CHL IP CERVICAL ESOPHAGEAL PHASE 01/08/2019 Cervical Esophageal Phase WFL Pudding Teaspoon -- Pudding Cup -- Honey Teaspoon -- Honey Cup -- Nectar Teaspoon -- Nectar Cup -- Nectar Straw -- Thin Teaspoon -- Thin Cup -- Thin Straw -- Puree -- Mechanical Soft -- Regular -- Multi-consistency -- Pill -- Cervical Esophageal Comment -- Dannial Monarch 01/08/2019, 10:46 AM Sonia Baller, MA, CCC-SLP Speech Therapy [facility]               Microbiology Recent Results (from the past 240 hour(s))  Blood Culture (routine x 2)     Status: None   Collection Time: 02/04/2019  2:38 AM  Result Value Ref Range Status   Specimen Description   Final    BLOOD RIGHT HAND Performed at [facility], [place] [address], [place], [place] [address]    Special Requests   Final    BOTTLES DRAWN AEROBIC AND ANAEROBIC Blood Culture results may not be optimal due to an excessive volume of blood received in culture bottles Performed at [facility] [address], [place], [place] [address]    Culture   Final    NO GROWTH 5 DAYS Performed at [facility], [place] [address],  [place], [place] [address]    Report Status 01/29/2019 FINAL  Final  SARS Coronavirus 2 (CEPHEID- Performed in [place] hospital lab), Hosp Order     Status: None   Collection Time: 02/12/2019  2:45 AM  Result Value Ref Range Status   SARS Coronavirus 2 NEGATIVE NEGATIVE Final    Comment: (NOTE) If result is NEGATIVE SARS-CoV-2 target nucleic acids are NOT DETECTED. The SARS-CoV-2 RNA is generally detectable in upper and lower  respiratory specimens during the acute phase of infection. The lowest  concentration of SARS-CoV-2 viral copies this assay can detect is 250  copies / mL. A negative result does not preclude SARS-CoV-2 infection  and should not be used as the sole basis for treatment or other  patient management decisions.  A negative result may occur with  improper specimen collection / handling, submission of specimen other  than nasopharyngeal swab, presence of viral mutation(s) within the  areas targeted by this assay, and inadequate number of viral copies  (<250 copies / mL). A negative result must be combined with clinical  observations, patient history, and epidemiological information. If result is POSITIVE SARS-CoV-2 target nucleic acids are DETECTED. The SARS-CoV-2 RNA is generally detectable in upper and lower  respiratory specimens dur ing the acute phase of infection.  Positive  results are indicative of active infection with SARS-CoV-2.  Clinical  correlation with patient history and other diagnostic information is  necessary to determine patient infection status.  Positive results do  not rule out bacterial infection or co-infection with other viruses. If result is PRESUMPTIVE POSTIVE SARS-CoV-2 nucleic acids MAY BE PRESENT.   A presumptive positive result was obtained on the submitted specimen  and confirmed on repeat testing.  While 2019 novel coronavirus  (  SARS-CoV-2) nucleic acids may be present in the submitted sample  additional confirmatory testing may be  necessary for epidemiological  and / or clinical management purposes  to differentiate between  SARS-CoV-2 and other Sarbecovirus currently known to infect humans.  If clinically indicated additional testing with an alternate test  methodology [id number]) is advised. The SARS-CoV-2 RNA is generally  detectable in upper and lower respiratory sp ecimens during the acute  phase of infection. The expected result is Negative. Fact Sheet for Patients:  StrictlyIdeas.no Fact Sheet for Healthcare Providers: BankingDealers.co.za This test is not yet approved or cleared by the [place] FDA and has been authorized for detection and/or diagnosis of SARS-CoV-2 by FDA under an Emergency Use Authorization (EUA).  This EUA will remain in effect (meaning this test can be used) for the duration of the COVID-19 declaration under Section 564(b)(1) of the Act, 21 U.S.C. section 360bbb-3(b)(1), unless the authorization is terminated or revoked sooner. Performed at [facility], [place] [address] [place], [place] [address]   Blood Culture (routine x 2)     Status: None   Collection Time: 02/18/2019  2:58 AM  Result Value Ref Range Status   Specimen Description   Final    BLOOD LEFT FOREARM Performed at [facility], [place] [address] [place], [place] [address]    Special Requests   Final    BOTTLES DRAWN AEROBIC AND ANAEROBIC Blood Culture adequate volume Performed at [facility] [address] [place], [place] [address]    Culture   Final    NO GROWTH 5 DAYS Performed at [facility], [place] [address], [place], [place] [address]    Report Status 01/29/2019 FINAL  Final    Lab Basic Metabolic Panel: Recent Labs  Lab 02/04/2019 0300 01/25/19 0311 01/26/19 0305  NA 140 144 148*  K 4.1 3.8 4.2  CL 106 117* 119*  CO2 24 21* 21*  GLUCOSE 77 103* 179*  BUN 31* 25* 24*  CREATININE 1.54* 1.00 1.02   CALCIUM 8.1* 7.9* 8.4*  MG  --  2.0  --   PHOS  --  3.3  --    Liver Function Tests: Recent Labs  Lab 02/08/2019 0300  AST 39  ALT 20  ALKPHOS 122  BILITOT 0.8  PROT 6.6  ALBUMIN 2.1*   No results for input(s): LIPASE, AMYLASE in the last 168 hours. No results for input(s): AMMONIA in the last 168 hours. CBC: Recent Labs  Lab 02/08/2019 0300 01/25/19 0311 01/26/19 0305  WBC 13.6* 9.4 11.9*  NEUTROABS 12.5*  --   --   HGB 9.0* 8.6* 9.5*  HCT 31.4* 29.8* 33.8*  MCV 96.6 100.0 99.1  PLT 392 362 341   Cardiac Enzymes: No results for input(s): CKTOTAL, CKMB, CKMBINDEX, TROPONINI in the last 168 hours. Sepsis Labs: Recent Labs  Lab 01/23/2019 0300 02/06/2019 0936 01/25/19 0311 01/26/19 0305  PROCALCITON 1.59  --   --   --   WBC 13.6*  --  9.4 11.9*  LATICACIDVEN 1.6 1.3  --   --      Dessa Phi 01/29/2019, 2:21 PM

## 2019-02-21 DEATH — deceased

## 2019-04-18 ENCOUNTER — Ambulatory Visit: Payer: Medicare Other | Admitting: Cardiovascular Disease
